# Patient Record
Sex: Female | Born: 1990 | Race: Black or African American | Hispanic: No | Marital: Single | Smoking: Current every day smoker
Health system: Southern US, Community
[De-identification: ages and names within clinical notes are randomized; demographics above are authoritative.]

## PROBLEM LIST (undated history)

## (undated) ENCOUNTER — Inpatient Hospital Stay (HOSPITAL_COMMUNITY): Payer: Self-pay

## (undated) DIAGNOSIS — A749 Chlamydial infection, unspecified: Secondary | ICD-10-CM

## (undated) DIAGNOSIS — Z8619 Personal history of other infectious and parasitic diseases: Secondary | ICD-10-CM

## (undated) DIAGNOSIS — O3430 Maternal care for cervical incompetence, unspecified trimester: Secondary | ICD-10-CM

## (undated) DIAGNOSIS — R51 Headache: Secondary | ICD-10-CM

## (undated) DIAGNOSIS — O26892 Other specified pregnancy related conditions, second trimester: Secondary | ICD-10-CM

## (undated) HISTORY — DX: Personal history of other infectious and parasitic diseases: Z86.19

---
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
## 2012-01-01 LAB — GC/CHLAMYDIA PROBE AMP: CT Probe RNA: POSITIVE — AB

## 2012-01-06 ENCOUNTER — Telehealth: Payer: Self-pay

## 2012-01-06 NOTE — Telephone Encounter (Signed)
Pt was called and given STD results. Pt was advised of + chlamydia as well as reporting to the [facility]. Pt was asked about treating partner, stated that she was not sure who she contacted std from. Pt will be contacted regarding ov for TOC and BC  F/u. RX called to [facility] for Zithromax 1 g po x 1. Sherri Shepherd

## 2012-01-07 ENCOUNTER — Telehealth: Payer: Self-pay

## 2012-01-07 NOTE — Telephone Encounter (Signed)
Called pt to get her scheduled for 2 up-coming appts. Sherri Shepherd A

## 2012-01-12 ENCOUNTER — Ambulatory Visit (INDEPENDENT_AMBULATORY_CARE_PROVIDER_SITE_OTHER): Payer: 59 | Admitting: Obstetrics and Gynecology

## 2012-01-12 ENCOUNTER — Encounter: Payer: Self-pay | Admitting: Obstetrics and Gynecology

## 2012-01-12 VITALS — BP 110/78 | Wt 111.0 lb

## 2012-01-12 DIAGNOSIS — Z309 Encounter for contraceptive management, unspecified: Secondary | ICD-10-CM

## 2012-01-12 DIAGNOSIS — Z09 Encounter for follow-up examination after completed treatment for conditions other than malignant neoplasm: Secondary | ICD-10-CM

## 2012-01-12 NOTE — Progress Notes (Signed)
21 YO with Nexplanon inserted last week for follow up.  Has no complaints.  O: Left medial upper arm with palpable Nexplanon (palpated by clinician and patient),  no evidence of infection  A: Nexplanon Follow up  P: RTO- as scheduled or prn.  Nieshia Larmon, PA-C

## 2012-01-20 ENCOUNTER — Encounter: Payer: Self-pay | Admitting: Obstetrics and Gynecology

## 2012-01-20 ENCOUNTER — Ambulatory Visit (INDEPENDENT_AMBULATORY_CARE_PROVIDER_SITE_OTHER): Payer: 59 | Admitting: Obstetrics and Gynecology

## 2012-01-20 DIAGNOSIS — Z113 Encounter for screening for infections with a predominantly sexual mode of transmission: Secondary | ICD-10-CM

## 2012-01-21 NOTE — Progress Notes (Signed)
Patient ID: Sherri Shepherd, female   DOB: [birth year], 21 y.o.   MRN: [id number] Pt left without being seen this day. She will r/s. Melody Comas A

## 2013-12-26 ENCOUNTER — Encounter: Payer: Self-pay | Admitting: Obstetrics and Gynecology

## 2014-10-02 ENCOUNTER — Emergency Department (HOSPITAL_COMMUNITY)
Admission: EM | Admit: 2014-10-02 | Discharge: 2014-10-02 | Discharge status: Against Medical Advice | Payer: Medicaid Other

## 2014-10-02 NOTE — ED Notes (Signed)
Call pt x 2 no answer

## 2014-11-09 ENCOUNTER — Ambulatory Visit: Payer: Medicaid Other | Admitting: Certified Nurse Midwife

## 2015-02-25 NOTE — L&D Delivery Note (Signed)
  Patient presented to our MAU on 1/17 with PPROM on 1/16. She was seen in the afternoon and elected to discharge home. She returned later that evening with vaginal bleeding, not contracting, and again elected to return home. At approximately 03:00 this morning she began to experience sharp contractions and delivered shortly thereafter. EMS was called and the patient was transported to our hospital. They report no significant bleeding en route. On arrival deceased infant wrapped in towel. Cord clamped and cut. No bleeding. Cytotec 800 mcg vaginal and pitocin 40 units IV started.  Three hours later vaginal exam revealed majority of placenta in uterus. Speculum inserted and placenta grasped with ring forceps and removed, appearing intact. Bleeding minimal. Will give methergine 0.2 mg po q6 for 24 hours.

## 2015-03-12 ENCOUNTER — Encounter (HOSPITAL_COMMUNITY): Payer: Self-pay | Admitting: Emergency Medicine

## 2015-03-12 ENCOUNTER — Inpatient Hospital Stay (EMERGENCY_DEPARTMENT_HOSPITAL)
Admission: EM | Admit: 2015-03-12 | Discharge: 2015-03-13 | Disposition: A | Discharge status: Home / Self Care | Payer: 59 | Source: Home / Self Care | Attending: Emergency Medicine | Admitting: Emergency Medicine

## 2015-03-12 DIAGNOSIS — Z8751 Personal history of pre-term labor: Secondary | ICD-10-CM

## 2015-03-12 DIAGNOSIS — O09899 Supervision of other high risk pregnancies, unspecified trimester: Secondary | ICD-10-CM

## 2015-03-12 DIAGNOSIS — O42012 Preterm premature rupture of membranes, onset of labor within 24 hours of rupture, second trimester: Secondary | ICD-10-CM

## 2015-03-12 DIAGNOSIS — O09219 Supervision of pregnancy with history of pre-term labor, unspecified trimester: Secondary | ICD-10-CM

## 2015-03-12 DIAGNOSIS — O42919 Preterm premature rupture of membranes, unspecified as to length of time between rupture and onset of labor, unspecified trimester: Secondary | ICD-10-CM | POA: Diagnosis present

## 2015-03-12 DIAGNOSIS — O4290 Premature rupture of membranes, unspecified as to length of time between rupture and onset of labor, unspecified weeks of gestation: Secondary | ICD-10-CM

## 2015-03-12 DIAGNOSIS — O09212 Supervision of pregnancy with history of pre-term labor, second trimester: Secondary | ICD-10-CM

## 2015-03-12 DIAGNOSIS — O09892 Supervision of other high risk pregnancies, second trimester: Secondary | ICD-10-CM

## 2015-03-12 DIAGNOSIS — Z3A20 20 weeks gestation of pregnancy: Secondary | ICD-10-CM

## 2015-03-12 NOTE — ED Notes (Signed)
Pt states she is [redacted] weeks pregnant and started leaking vaginal fluid about 1530 this afternoon and states it will not quit  Pt denies any abd pain or cramping  Pt states the fluid is clear

## 2015-03-13 ENCOUNTER — Emergency Department (HOSPITAL_COMMUNITY): Payer: 59

## 2015-03-13 ENCOUNTER — Encounter (HOSPITAL_COMMUNITY): Payer: Self-pay | Admitting: *Deleted

## 2015-03-13 ENCOUNTER — Inpatient Hospital Stay (HOSPITAL_COMMUNITY): Payer: 59

## 2015-03-13 ENCOUNTER — Inpatient Hospital Stay (EMERGENCY_DEPARTMENT_HOSPITAL)
Admission: AD | Admit: 2015-03-13 | Discharge: 2015-03-13 | Disposition: A | Discharge status: Home / Self Care | Payer: 59 | Source: Ambulatory Visit | Attending: Family Medicine | Admitting: Family Medicine

## 2015-03-13 DIAGNOSIS — O42919 Preterm premature rupture of membranes, unspecified as to length of time between rupture and onset of labor, unspecified trimester: Secondary | ICD-10-CM | POA: Diagnosis present

## 2015-03-13 DIAGNOSIS — Z3A20 20 weeks gestation of pregnancy: Secondary | ICD-10-CM

## 2015-03-13 DIAGNOSIS — O09219 Supervision of pregnancy with history of pre-term labor, unspecified trimester: Secondary | ICD-10-CM

## 2015-03-13 DIAGNOSIS — O42912 Preterm premature rupture of membranes, unspecified as to length of time between rupture and onset of labor, second trimester: Secondary | ICD-10-CM

## 2015-03-13 DIAGNOSIS — O09899 Supervision of other high risk pregnancies, unspecified trimester: Secondary | ICD-10-CM

## 2015-03-13 DIAGNOSIS — O4592 Premature separation of placenta, unspecified, second trimester: Secondary | ICD-10-CM

## 2015-03-13 LAB — RAPID URINE DRUG SCREEN, HOSP PERFORMED
Amphetamines: NOT DETECTED
BARBITURATES: NOT DETECTED
BENZODIAZEPINES: NOT DETECTED
Cocaine: NOT DETECTED
Opiates: NOT DETECTED
Tetrahydrocannabinol: NOT DETECTED

## 2015-03-13 LAB — URINALYSIS, ROUTINE W REFLEX MICROSCOPIC
Glucose, UA: NEGATIVE mg/dL
HGB URINE DIPSTICK: NEGATIVE
KETONES UR: 40 mg/dL — AB
NITRITE: NEGATIVE
PROTEIN: NEGATIVE mg/dL
Specific Gravity, Urine: >1.03 — ABNORMAL HIGH (ref 1.005–1.030)
pH: 5.5 (ref 5.0–8.0)

## 2015-03-13 LAB — CBC
HCT: 35.3 % — ABNORMAL LOW (ref 36.0–46.0)
Hemoglobin: 11.8 g/dL — ABNORMAL LOW (ref 12.0–15.0)
MCH: 31.1 pg (ref 26.0–34.0)
MCHC: 33.4 g/dL (ref 30.0–36.0)
MCV: 93.1 fL (ref 78.0–100.0)
Platelets: 168 K/uL (ref 150–400)
RBC: 3.79 MIL/uL — ABNORMAL LOW (ref 3.87–5.11)
RDW: 14.2 % (ref 11.5–15.5)
WBC: 10.2 K/uL (ref 4.0–10.5)

## 2015-03-13 LAB — BASIC METABOLIC PANEL
Anion gap: 10 (ref 5–15)
BUN: <5 mg/dL — ABNORMAL LOW (ref 6–20)
CO2: 21 mmol/L — ABNORMAL LOW (ref 22–32)
Calcium: 8.8 mg/dL — ABNORMAL LOW (ref 8.9–10.3)
Chloride: 107 mmol/L (ref 101–111)
Creatinine, Ser: 0.47 mg/dL (ref 0.44–1.00)
GFR calc Af Amer: >60 mL/min (ref >60)
GFR calc non Af Amer: >60 mL/min (ref >60)
Glucose, Bld: 77 mg/dL (ref 65–99)
Potassium: 3.5 mmol/L (ref 3.5–5.1)
Sodium: 138 mmol/L (ref 135–145)

## 2015-03-13 LAB — URINE MICROSCOPIC-ADD ON
BACTERIA UA: NONE SEEN
RBC / HPF: NONE SEEN RBC/hpf (ref 0–5)

## 2015-03-13 LAB — WET PREP, GENITAL
CLUE CELLS WET PREP: NONE SEEN
Sperm: NONE SEEN
TRICH WET PREP: NONE SEEN
Yeast Wet Prep HPF POC: NONE SEEN

## 2015-03-13 LAB — AMNISURE RUPTURE OF MEMBRANE (ROM) NOT AT ARMC: AMNISURE: NEGATIVE
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
## 2015-03-13 NOTE — ED Provider Notes (Signed)
CSN: [id number]     Arrival date & time 03/12/15  1946 History   First MD Initiated Contact with Patient 03/13/15 0032     Chief Complaint  Patient presents with  . Vaginal Discharge    HPI   25 year old G2 P1 A0 who presents today with amniotic fluid loss. Patient reports that at approximately 3 PM this afternoon she had copious amounts of clear fluid discharging from her vagina. She reports the soaked through her close, and had continued until the time my evaluation. Patient denies any associated abdominal pain, preceding vaginal discharge, bleeding, or any other concerning signs or symptoms. Patient reports that she has had a normal vaginal delivery with her now 24-year-old son, but this was preterm at 79 weeks. Patient denies any drug or alcohol use, does not smoke, has no chronic health conditions, and no complications with previous pregnancy. Patient reports that she [facility], she is uncertain as to what the results of the ultrasound were. Patient reports to nursing staff that she did have sexual intercourse earlier today.    Past Medical History  Diagnosis Date  . No pertinent past medical history   . H/O varicella   . Premature dilatation of cervix during pregnancy 10/01/2011    5/90%/-1  . Preterm delivery 12/04/2011    SVD at [redacted]w[redacted]d on 10/22/11   Past Surgical History  Procedure Laterality Date  . No past surgeries     Family History  Problem Relation Age of Onset  . Hypertension Mother   . Diabetes Brother   . Diabetes Maternal Uncle   . Cancer Maternal Grandmother     LUNG  . Diabetes Maternal Grandfather   . Arthritis Paternal Grandmother   . Cancer Paternal Grandmother     BREAST   Social History  Substance Use Topics  . Smoking status: Never Smoker   . Smokeless tobacco: Never Used  . Alcohol Use: No   OB History    Gravida Para Term Preterm AB TAB SAB Ectopic Multiple Living        Review of Systems  All other systems reviewed and are negative.   Allergies  Review of patient's allergies indicates no known allergies.  Home Medications   Prior to Admission medications   Medication Sig Start Date End Date Taking? Authorizing Provider  Prenatal Vit-Fe Fumarate-FA (PRENATAL MULTIVITAMIN) TABS Take 1 tablet by mouth daily.   Yes Historical Provider, MD   BP 112/78 mmHg  Pulse 89  Temp(Src) 98.2 F (36.8 C) (Oral)  Resp 22  Ht  (1.651 m)  Wt 52.731 kg  BMI 19.35 kg/m2  SpO2 100%  LMP 10/01/2014 (Approximate) Physical Exam  Constitutional: She is oriented to person, place, and time. She appears well-developed and well-nourished.  HENT:  Head: Normocephalic and atraumatic.  Eyes: Conjunctivae are normal. Pupils are equal, round, and reactive to light. Right eye exhibits no discharge. Left eye exhibits no discharge. No scleral icterus.  Neck: Normal range of motion. No JVD present. No tracheal deviation present.  Pulmonary/Chest: Effort normal. No stridor.  Abdominal: Soft. Bowel sounds are normal. She exhibits no distension and no mass. There is no tenderness. There is no rebound and no guarding.  Genitourinary: Vaginal discharge found.  Copious amounts of clear/ yellowish vaginal discharge. Speculum exam performed, unable to visualize cervix due to fluid in the vaginal vault.   Neurological: She is alert  and oriented to person, place, and time. Coordination normal.  Skin: Skin is warm and dry. No rash noted. No erythema. No pallor.  Psychiatric: She has a normal mood and affect. Her behavior is normal. Judgment and thought content normal.  Nursing note and vitals reviewed.   ED Course  Procedures (including critical care time) Labs Review Labs Reviewed  CBC - Abnormal; Notable for the following:    RBC 3.79 (*)    Hemoglobin 11.8 (*)    HCT 35.3 (*)    All other components within normal limits  BASIC METABOLIC PANEL  POCT NITRAZINE TEST     Imaging Review US Ob Limited  03/13/2015  CLINICAL DATA:  Premature rupture of membranes. Twenty-three weeks and 1 day gestational age by last menstrual period. EXAM: LIMITED OBSTETRIC ULTRASOUND FINDINGS: Number of Fetuses: 1 Heart Rate:  141 bpm Movement: Yes Presentation: Cephalic Placental Location: Appearing circumferential. Previa: Difficult to evaluate. Amniotic Fluid (Subjective): Decreased. (0.7 cm in RIGHT lower quadrant) BPD:  2.9cm 18w  6d MATERNAL FINDINGS: Cervix:  Appears closed. Uterus/Adnexae:  No abnormality visualized. IMPRESSION: Oligohydramnios. Single live intrauterine pregnancy, gestational age by ultrasound 18 weeks and 6 days. Limited assessment for placenta via previa due to lack of amniotic fluid. Recommend close attention on follow-up imaging. This exam is performed on an emergent basis and does not comprehensively evaluate fetal size, dating, or anatomy; follow-up complete OB US should be considered if further fetal assessment is warranted. Electronically Signed   By: Awilda Metro M.D.   On: 03/13/2015 03:10   I have personally reviewed and evaluated these images and lab results as part of my medical decision-making.   EKG Interpretation None      MDM   Final diagnoses:  Premature rupture of membranes   Labs: CBC, BMP  Imaging: Ultrasound OB limited  Consults: OB/GYN- Orma Render   Therapeutics:  Discharge Meds:   Assessment/Plan: Patient's presentation is most consistent with premature rupture of membranes. Patient has no associated abdominal pain or bleeding. Consultation with OB/GYN with recommendation of transfer to than MAU . Patient was transferred at this time, she is stable in no acute distress.         Eyvonne Mechanic, PA-C 03/13/15 0401  Laurence Spates, MD 03/13/15 [phone]

## 2015-03-13 NOTE — MAU Note (Signed)
Patient still trying to decide on option to either be admitted or go home. Will call Dr Ashok Pall when decision has been made.

## 2015-03-13 NOTE — MAU Note (Signed)
Pt presents to MAU with complaints of vaginal bleeding that started 45 minutes ago. PT was evaluated in MAU this morning after [facility] Dr Shawnie Pons evaluated pt this morning with options on care. Pt discharged home and was to follow up in the clinic this week.

## 2015-03-13 NOTE — MAU Provider Note (Signed)
History     CSN: [id number]  Arrival date and time: 03/13/15 0931   None     Chief Complaint  Patient presents with  . Vaginal Discharge   HPI Patient is transferred from [facility] ED for presumed ROM.  Patient is 20 0/7 wks today per her report of u/s done at [facility]. She is 23 1/7 wks by LMP. She reports following intercourse that she began leaking fluid. Described as running down her legs in the ED. U/s performed there showed oligohydramnios. She is transferred here for further w/u and eval. Denies s/sx's of labor. ROM now > 24 hours.  Pertinent Gynecological History: Menses: regular every month without intermenstrual spotting Sexually transmitted diseases: recent diagnosis: chlamydia, with both she and partner treated in December. OB History  Gravida Para Term Preterm AB SAB TAB Ectopic Multiple Living     # Outcome Date GA Lbr Len/2nd Weight Sex Delivery Anes PTL Lv  2 Current           1 Preterm 10/22/11 [redacted]w[redacted]d 11:10 / 03:30 5 lb 12.4 oz (2.62 kg) M Vag-Spont EPI  Y     Comments: none       Past Medical History  Diagnosis Date  . No pertinent past medical history   . H/O varicella   . Premature dilatation of cervix during pregnancy 10/01/2011    5/90%/-1  . Preterm delivery 12/04/2011    SVD at [redacted]w[redacted]d on 10/22/11    Past Surgical History  Procedure Laterality Date  . No past surgeries      Family History  Problem Relation Age of Onset  . Hypertension Mother   . Diabetes Brother   . Diabetes Maternal Uncle   . Cancer Maternal Grandmother     LUNG  . Diabetes Maternal Grandfather   . Arthritis Paternal Grandmother   . Cancer Paternal Grandmother     BREAST    Social History  Substance Use Topics  . Smoking status: Never Smoker   . Smokeless tobacco: Never Used  . Alcohol Use: No    Allergies: No Known Allergies  Prescriptions prior to admission  Medication Sig Dispense Refill Last Dose  . Prenatal Vit-Fe Fumarate-FA (PRENATAL  MULTIVITAMIN) TABS Take 1 tablet by mouth daily.   03/12/2015 at Unknown time    Review of Systems  Constitutional: Negative for fever and chills.  HENT: Negative for congestion.   Respiratory: Negative for cough and shortness of breath.   Cardiovascular: Negative for chest pain.  Gastrointestinal: Negative for abdominal pain.  Genitourinary: Positive for dysuria.  Neurological: Negative for seizures and headaches.     Physical Exam   Blood pressure 100/64, pulse 111, temperature 98.1 F (36.7 C), temperature source Oral, resp. rate 16, height  (1.651 m), weight 116 lb 4 oz (52.731 kg), last menstrual period 10/02/2014, SpO2 99 %.  Physical Exam  Constitutional: She appears well-developed and well-nourished.  HENT:  Head: Normocephalic and atraumatic.  Eyes: No scleral icterus.  Neck: Neck supple.  Cardiovascular: Normal rate and regular rhythm.   Respiratory: Effort normal.  GI: Soft. There is no tenderness.  Genitourinary: Vaginal discharge (yellow, purulent, thin) found.  SSE: pooling +, slide reveals [facility] and a lot of bacteria, could not see ferning. Cervix appears closed.   Urinalysis    Component Value Date/Time   COLORURINE AMBER* 03/13/2015 1115   APPEARANCEUR CLEAR 03/13/2015 1115   LABSPEC >1.030* 03/13/2015 1115   PHURINE  5.5 03/13/2015 1115   GLUCOSEU NEGATIVE 03/13/2015 1115   HGBUR NEGATIVE 03/13/2015 1115   BILIRUBINUR SMALL* 03/13/2015 1115   BILIRUBINUR NEG 09/29/2011 1533   KETONESUR 40* 03/13/2015 1115   PROTEINUR NEGATIVE 03/13/2015 1115   PROTEINUR TRACE 09/29/2011 1533   UROBILINOGEN 0.2 10/01/2011 2130   UROBILINOGEN negative 09/29/2011 1533   NITRITE NEGATIVE 03/13/2015 1115   NITRITE NEG 09/29/2011 1533   LEUKOCYTESUR TRACE* 03/13/2015 1115     Microscopic wet-mount exam shows white blood cells. UDS negative U/S shows AFI 1.27 Amnisure neg CBC    Component Value Date/Time   WBC 10.2 03/13/2015 0317   RBC 3.79* 03/13/2015  0317   HGB 11.8* 03/13/2015 0317   HCT 35.3* 03/13/2015 0317   PLT 168 03/13/2015 0317   MCV 93.1 03/13/2015 0317   MCH 31.1 03/13/2015 0317   MCHC 33.4 03/13/2015 0317   RDW 14.2 03/13/2015 0317   LYMPHSABS 2.6 09/29/2011 1513   MONOABS 1.0 09/29/2011 1513   EOSABS 0.1 09/29/2011 1513   BASOSABS 0.1 09/29/2011 1513       MAU Course  Procedures  MDM Previable PPROM in the second trimester is associated with risks such as chorioamnionitis, placental abruption, umbilical cord prolapse, maternal distress due to overwhelming infection; and these may constitute indications for delivery prior to viability.. Should she make it to viability, risks associated with pre-viable PPROM include classical C-section and fetal sepsis and distress. Chances of pulmonary hypoplasia and risks of pre-maturity including major handicaps and Cerebral Palsy were also discussed.  Assessment and Plan   Problem List Items Addressed This Visit      Unprioritized   Premature rupture of membranes   Relevant Orders   US OB Limited (Completed)   [place] MFM OB LIMITED   History of preterm delivery   Relevant Orders   [place] MFM OB LIMITED    Other Visit Diagnoses    [redacted] weeks gestation of pregnancy    -  Primary    Relevant Orders    [place] MFM OB LIMITED    History of preterm delivery, currently pregnant, second trimester        Preterm premature rupture of membranes with onset of labor within 24 hours of rupture in second trimester        Relevant Orders    [place] MFM OB LIMITED      She is not in labor and will be sent home on pelvic rest, daily temps and plan for admission when she reaches viability or becomes infected.  Will transfer patient to [facility] for remainder of care GC/Chlam pending--retreat if needed.  Aspen Deterding S 03/13/2015, 11:26 AM

## 2015-03-13 NOTE — ED Notes (Addendum)
[facility] HERE FOR THIS PT. TRANSFER TO MAU.
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
## 2015-03-13 NOTE — MAU Note (Signed)
Pt presents to MAU via EMS from [facility] ED for possible ROM. Denies any vaginal bleeding or pain.

## 2015-03-13 NOTE — ED Notes (Signed)
PT HAS CELL PHONE AT TIME OF TRANSFER

## 2015-03-13 NOTE — Discharge Instructions (Signed)
Breaking your water in the second trimester is associated with risks such as infectio and bleeding and these may constitute indications for delivery prior to an age that the baby can survive. Should you make it to an age where the baby can survive (about 24 weeks), risks associated with prematurity include needing a classical C-section (which is vertical and will necessitate a C-section for all deliveries) and fetal infection, major handicaps, possible Cerebral Palsy and even death. There is no guarantee that if the baby gets to an age it can survive that it will.  You are not in labor and will be sent home. Please check daily temperatures (above 101 should bring you to the hospital) and plan for admission when the baby can survive or you develop infection, or go into labor or have heavy bleeding.     Preterm Labor Information Preterm labor is when labor starts at less than 37 weeks of pregnancy. The normal length of a pregnancy is 39 to 41 weeks. CAUSES Often, there is no identifiable underlying cause as to why a woman goes into preterm labor. One of the most common known causes of preterm labor is infection. Infections of the uterus, cervix, vagina, amniotic sac, bladder, kidney, or even the lungs (pneumonia) can cause labor to start. Other suspected causes of preterm labor include:   Urogenital infections, such as yeast infections and bacterial vaginosis.   Uterine abnormalities (uterine shape, uterine septum, fibroids, or bleeding from the placenta).   A cervix that has been operated on (it may fail to stay closed).   Malformations in the fetus.   Multiple gestations (twins, triplets, and so on).   Breakage of the amniotic sac.  RISK FACTORS  Having a previous history of preterm labor.   Having premature rupture of membranes (PROM).   Having a placenta that covers the opening of the cervix (placenta previa).   Having a placenta that separates from the uterus (placental  abruption).   Having a cervix that is too weak to hold the fetus in the uterus (incompetent cervix).   Having too much fluid in the amniotic sac (polyhydramnios).   Taking illegal drugs or smoking while pregnant.   Not gaining enough weight while pregnant.   Being younger than 17 and older than 23 years old.   Having a low socioeconomic status.   Being African American. SYMPTOMS Signs and symptoms of preterm labor include:   Menstrual-like cramps, abdominal pain, or back pain.  Uterine contractions that are regular, as frequent as six in an hour, regardless of their intensity (may be mild or painful).  Contractions that start on the top of the uterus and spread down to the lower abdomen and back.   A sense of increased pelvic pressure.   A watery or bloody mucus discharge that comes from the vagina.  TREATMENT Depending on the length of the pregnancy and other circumstances, your health care provider may suggest bed rest. If necessary, there are medicines that can be given to stop contractions and to mature the fetal lungs. If labor happens before 34 weeks of pregnancy, a prolonged hospital stay may be recommended. Treatment depends on the condition of both you and the fetus.  WHAT SHOULD YOU DO IF YOU THINK YOU ARE IN PRETERM LABOR? Call your health care provider right away. You will need to go to the hospital to get checked immediately. HOW CAN YOU PREVENT PRETERM LABOR IN FUTURE PREGNANCIES? You should:   Stop smoking if you smoke.  Maintain  healthy weight gain and avoid chemicals and drugs that are not necessary.  Be watchful for any type of infection.  Inform your health care provider if you have a known history of preterm labor.   This information is not intended to replace advice given to you by your health care provider. Make sure you discuss any questions you have with your health care provider.   Document Released: 05/03/2003 Document Revised:  10/13/2012 Document Reviewed: 03/15/2012 Elsevier Interactive Patient Education [employer]

## 2015-03-13 NOTE — MAU Note (Signed)
Patient has decided that she would like to go home. Dr Ashok Pall called and at bedside to discuss with patient.

## 2015-03-13 NOTE — ED Notes (Signed)
MAU CALLED THIS AM. AWARE OF [facility] TRANSFER

## 2015-03-14 ENCOUNTER — Encounter (HOSPITAL_COMMUNITY): Payer: Self-pay | Admitting: *Deleted

## 2015-03-14 ENCOUNTER — Inpatient Hospital Stay (HOSPITAL_COMMUNITY)
Admission: AD | Admit: 2015-03-14 | Discharge: 2015-03-14 | DRG: 776 | Disposition: A | Discharge status: Home / Self Care | Payer: 59 | Source: Ambulatory Visit | Attending: Family Medicine | Admitting: Family Medicine

## 2015-03-14 DIAGNOSIS — O09899 Supervision of other high risk pregnancies, unspecified trimester: Secondary | ICD-10-CM

## 2015-03-14 DIAGNOSIS — O42919 Preterm premature rupture of membranes, unspecified as to length of time between rupture and onset of labor, unspecified trimester: Secondary | ICD-10-CM | POA: Diagnosis present

## 2015-03-14 DIAGNOSIS — O09219 Supervision of pregnancy with history of pre-term labor, unspecified trimester: Secondary | ICD-10-CM

## 2015-03-14 DIAGNOSIS — O3432 Maternal care for cervical incompetence, second trimester: Secondary | ICD-10-CM

## 2015-03-14 DIAGNOSIS — Z8619 Personal history of other infectious and parasitic diseases: Secondary | ICD-10-CM | POA: Diagnosis present

## 2015-03-14 LAB — TYPE AND SCREEN
ABO/RH(D): O POS
Antibody Screen: NEGATIVE

## 2015-03-14 LAB — CBC
HEMATOCRIT: 29.9 % — AB (ref 36.0–46.0)
HEMOGLOBIN: 10.2 g/dL — AB (ref 12.0–15.0)
MCH: 30.7 pg (ref 26.0–34.0)
MCHC: 34.1 g/dL (ref 30.0–36.0)
MCV: 90.1 fL (ref 78.0–100.0)
PLATELETS: 146 10*3/uL — AB (ref 150–400)
RBC: 3.32 MIL/uL — AB (ref 3.87–5.11)
RDW: 14 % (ref 11.5–15.5)
WBC: 22.3 10*3/uL — AB (ref 4.0–10.5)

## 2015-03-14 LAB — ABO/RH: ABO/RH(D): O POS

## 2015-03-14 LAB — GC/CHLAMYDIA PROBE AMP (~~LOC~~) NOT AT ARMC
CHLAMYDIA, DNA PROBE: NEGATIVE
NEISSERIA GONORRHEA: NEGATIVE

## 2015-03-14 MED ORDER — LACTATED RINGERS IV SOLN: 500.0000 mL | INTRAVENOUS | Status: DC | PRN

## 2015-03-14 MED ORDER — FLEET ENEMA 7-19 GM/118ML RE ENEM: 1.0000 | ENEMA | RECTAL | Status: DC | PRN

## 2015-03-14 MED ORDER — ONDANSETRON HCL 4 MG/2ML IJ SOLN: 4.0000 mg | Freq: Four times a day (QID) | INTRAMUSCULAR | Status: DC | PRN

## 2015-03-14 MED ORDER — LACTATED RINGERS IV SOLN
INTRAVENOUS | Status: DC
Start: 2015-03-14 — End: 2015-03-14

## 2015-03-14 MED ORDER — OXYTOCIN BOLUS FROM INFUSION: 500.0000 mL | INTRAVENOUS | Status: DC

## 2015-03-14 MED ORDER — METHYLERGONOVINE MALEATE 0.2 MG PO TABS
0.2000 mg | ORAL_TABLET | Freq: Four times a day (QID) | ORAL | Status: DC
Start: 2015-03-14 — End: 2015-03-14
  Administered 2015-03-14: 0.2 mg via ORAL
  Filled 2015-03-14: qty 1

## 2015-03-14 MED ORDER — OXYCODONE-ACETAMINOPHEN 5-325 MG PO TABS
ORAL_TABLET | ORAL | Status: AC
  Filled 2015-03-14: qty 1

## 2015-03-14 MED ORDER — MISOPROSTOL 200 MCG PO TABS
800.0000 ug | ORAL_TABLET | Freq: Once | ORAL | Status: AC
  Administered 2015-03-14: 800 ug via VAGINAL
  Filled 2015-03-14: qty 4

## 2015-03-14 MED ORDER — OXYCODONE-ACETAMINOPHEN 5-325 MG PO TABS
1.0000 | ORAL_TABLET | Freq: Once | ORAL | Status: AC
  Administered 2015-03-14: 1 via ORAL

## 2015-03-14 MED ORDER — OXYTOCIN 10 UNIT/ML IJ SOLN
2.5000 [IU]/h | INTRAMUSCULAR | Status: DC
  Administered 2015-03-14: 04:00:00 via INTRAVENOUS

## 2015-03-14 MED ORDER — CITRIC ACID-SODIUM CITRATE 334-500 MG/5ML PO SOLN
30.0000 mL | ORAL | Status: DC | PRN
  Filled 2015-03-14: qty 30

## 2015-03-14 MED ORDER — ACETAMINOPHEN 325 MG PO TABS: 650.0000 mg | ORAL_TABLET | ORAL | Status: DC | PRN

## 2015-03-14 MED ORDER — OXYTOCIN 10 UNIT/ML IJ SOLN
INTRAVENOUS | Status: AC
  Filled 2015-03-14: qty 4

## 2015-03-14 MED ORDER — INFLUENZA VAC SPLIT QUAD 0.5 ML IM SUSY
0.5000 mL | PREFILLED_SYRINGE | Freq: Once | INTRAMUSCULAR | Status: AC
  Administered 2015-03-14: 0.5 mL via INTRAMUSCULAR
  Filled 2015-03-14: qty 0.5

## 2015-03-14 MED ORDER — OXYCODONE-ACETAMINOPHEN 5-325 MG PO TABS: 1.0000 | ORAL_TABLET | ORAL | Status: DC | PRN

## 2015-03-14 MED ORDER — LIDOCAINE HCL (PF) 1 % IJ SOLN
30.0000 mL | INTRAMUSCULAR | Status: DC | PRN
  Filled 2015-03-14: qty 30

## 2015-03-14 MED ORDER — IBUPROFEN 600 MG PO TABS: 600.0000 mg | ORAL_TABLET | Freq: Four times a day (QID) | ORAL | Status: DC | PRN

## 2015-03-14 MED ORDER — OXYCODONE-ACETAMINOPHEN 5-325 MG PO TABS: 2.0000 | ORAL_TABLET | ORAL | Status: DC | PRN
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
## 2015-03-14 NOTE — Progress Notes (Signed)
Discharge instructions and reasons to call physician reviewed with patient.  All questions answered.  IV removed prior to discharge.  All belongings at bedside sent home with patient, no equipment sent home with patient.  Stable condition upon discharge.  All resources from chaplain and SW sent home with patient.  Escorted to the main entrance by NT.

## 2015-03-14 NOTE — H&P (Signed)
LABOR ADMISSION HISTORY AND PHYSICAL  VERNADINE COOMBS is a 25 y.o. female G35P0101 with IUP at [redacted]w[redacted]d by [place] presenting after delivering at home. Per chart review, she was initially seen on the morning of 03/12/14 in the MAU for PPROM that occurred 1/16 . Ultrasound at that point revealed severe oligohydramnios. Patient was counseled and opted for discharge home. She was seen again in the MAU on the evening of 1/17 for vaginal bleeding after PPROM which was thought to be likely due to abruption. Patient was counseled and patient opted to return home after observation without further vaginal bleeding.   Patient reports she started having cramping around 1 or 2 AM and delivered baby shortly after close to 3 AM. Patient did not deliver placenta. She was [facility] by EMS. En route, patient was given IVF bolus. Patient reports some cramping but has not other complaints. No vaginal bleeding currently. Denies lightheadedness, shortness of breath, fevers, chills.   Dating: By LMP --->  Estimated Date of Delivery: 07/31/15  Sono:  Unable to find in chart  Prenatal History/Complications: Hx of chlamydia x 2 during pregnancy; last diagnosed 12/12; treated 12/15 (by [facility])  Past Medical History: Past Medical History  Diagnosis Date  . No pertinent past medical history   . H/O varicella   . Premature dilatation of cervix during pregnancy 10/01/2011    5/90%/-1  . Preterm delivery 12/04/2011    SVD at [redacted]w[redacted]d on 10/22/11    Past Surgical History: Past Surgical History  Procedure Laterality Date  . No past surgeries      Obstetrical History: OB History    Gravida Para Term Preterm AB TAB SAB Ectopic Multiple Living        Social History: Social History   Social History  . Marital Status: Single    Spouse Name: N/A  . Number of Children: N/A  . Years of Education: 12   Occupational History  . HOMEMAKER    Social History Main Topics  . Smoking status: Never  Smoker   . Smokeless tobacco: Never Used  . Alcohol Use: No  . Drug Use: No  . Sexual Activity:    Partners: Male    Birth Control/ Protection: Implant     Comment: nexplanon   Other Topics Concern  . Not on file   Social History Narrative    Family History: Family History  Problem Relation Age of Onset  . Hypertension Mother   . Diabetes Brother   . Diabetes Maternal Uncle   . Cancer Maternal Grandmother     LUNG  . Diabetes Maternal Grandfather   . Arthritis Paternal Grandmother   . Cancer Paternal Grandmother     BREAST    Allergies: No Known Allergies  Prescriptions prior to admission  Medication Sig Dispense Refill Last Dose  . Prenatal Vit-Fe Fumarate-FA (PRENATAL MULTIVITAMIN) TABS Take 1 tablet by mouth daily.   03/12/2015 at Unknown time     Review of Systems   All systems reviewed and negative except as stated in HPI  LMP 10/24/2014 General appearance: alert and cooperative Lungs: clear to auscultation bilaterally Heart: regular rate and rhythm Abdomen: soft, non-tender; bowel sounds normal Extremities: Homans sign is negative, no sign of DVT, edema    Prenatal labs: ABO, Rh:   O pos Antibody:  negative Rubella: Immune RPR:   nonreactive HBsAg:   negative HIV:   Nonreactive GBS:   (unknown)  1  hr Glucola: n/a Genetic screening: CF screen negative Anatomy US: not in chart   Prenatal Transfer Tool  Maternal Diabetes: N/A Genetic Screening: Normal Maternal Ultrasounds/Referrals: not in chart Fetal Ultrasounds or other Referrals:  None Maternal Substance Abuse:  No Significant Maternal Medications:  None Significant Maternal Lab Results: None  Results for orders placed or performed during the hospital encounter of 03/12/15 (from the past 24 hour(s))  Wet prep, genital   Collection Time: 03/13/15 11:00 AM  Result Value Ref Range   Yeast Wet Prep HPF POC NONE SEEN NONE SEEN   Trich, Wet Prep NONE SEEN NONE SEEN   Clue Cells Wet Prep HPF  POC NONE SEEN NONE SEEN   WBC, Wet Prep HPF POC MANY (A) NONE SEEN   Sperm NONE SEEN   Amnisure rupture of membrane (rom)not at [facility]   Collection Time: 03/13/15 11:15 AM  Result Value Ref Range   Amnisure ROM NEGATIVE   Urinalysis, Routine w reflex microscopic (not at [facility])   Collection Time: 03/13/15 11:15 AM  Result Value Ref Range   Color, Urine AMBER (A) YELLOW   APPearance CLEAR CLEAR   Specific Gravity, Urine >1.030 (H) 1.005 - 1.030   pH 5.5 5.0 - 8.0   Glucose, UA NEGATIVE NEGATIVE mg/dL   Hgb urine dipstick NEGATIVE NEGATIVE   Bilirubin Urine SMALL (A) NEGATIVE   Ketones, ur 40 (A) NEGATIVE mg/dL   Protein, ur NEGATIVE NEGATIVE mg/dL   Nitrite NEGATIVE NEGATIVE   Leukocytes, UA TRACE (A) NEGATIVE  Urine rapid drug screen (hosp performed)   Collection Time: 03/13/15 11:15 AM  Result Value Ref Range   Opiates NONE DETECTED NONE DETECTED   Cocaine NONE DETECTED NONE DETECTED   Benzodiazepines NONE DETECTED NONE DETECTED   Amphetamines NONE DETECTED NONE DETECTED   Tetrahydrocannabinol NONE DETECTED NONE DETECTED   Barbiturates NONE DETECTED NONE DETECTED  Urine microscopic-add on   Collection Time: 03/13/15 11:15 AM  Result Value Ref Range   Squamous Epithelial / LPF 0-5 (A) NONE SEEN   WBC, UA 6-30 0 - 5 WBC/hpf   RBC / HPF NONE SEEN 0 - 5 RBC/hpf   Bacteria, UA NONE SEEN NONE SEEN    Patient Active Problem List   Diagnosis Date Noted  . Preterm premature rupture of membranes (PPROM) delivered, current hospitalization 03/13/2015  . History of preterm delivery 03/13/2015  . Nexplanon insertion - 12/30/11 12/30/2011  . Preterm delivery 12/04/2011  . Preterm labor 10/22/2011  . Chlamydia 10/02/2011  . Teen pregnancy 10/01/2011  . Trichomonas 10/01/2011  . Premature dilatation of cervix during pregnancy 10/01/2011  . Late prenatal care 09/27/2011    Assessment: MADISSEN WYSE is a 26 y.o. G2P0101 at [redacted]w[redacted]d here after home delivery. Placenta yet to be  delivered. Patient is clinically stable.  - Pitocin and cytotec to facilitate placenta delivery - declines autopsy - SW consult  Palma Holter, MD PGY 1 Family Medicine   OB FELLOW HISTORY AND PHYSICAL ATTESTATION  I have seen and examined this patient; I agree with above documentation in the resident's note.    Cherrie Gauze Ryelle Ruvalcaba 03/14/2015, 4:20 AM
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
## 2015-03-14 NOTE — Progress Notes (Signed)
Pt given d/c instructions & d/c to home.

## 2015-03-14 NOTE — Progress Notes (Signed)
UR chart review completed.  

## 2015-03-15 LAB — RPR: RPR: NONREACTIVE

## 2015-03-15 LAB — CULTURE, BETA STREP (GROUP B ONLY)

## 2015-03-28 DIAGNOSIS — A749 Chlamydial infection, unspecified: Secondary | ICD-10-CM

## 2015-03-28 HISTORY — DX: Chlamydial infection, unspecified: A74.9

## 2015-04-06 ENCOUNTER — Encounter (HOSPITAL_COMMUNITY): Payer: Self-pay | Admitting: Emergency Medicine

## 2015-04-06 ENCOUNTER — Emergency Department (HOSPITAL_COMMUNITY)
Admission: EM | Admit: 2015-04-06 | Discharge: 2015-04-06 | Disposition: A | Discharge status: Home / Self Care | Payer: Medicaid Other | Attending: Emergency Medicine | Admitting: Emergency Medicine

## 2015-04-06 DIAGNOSIS — Z79899 Other long term (current) drug therapy: Secondary | ICD-10-CM | POA: Diagnosis not present

## 2015-04-06 DIAGNOSIS — N72 Inflammatory disease of cervix uteri: Secondary | ICD-10-CM

## 2015-04-06 DIAGNOSIS — Z3490 Encounter for supervision of normal pregnancy, unspecified, unspecified trimester: Secondary | ICD-10-CM

## 2015-04-06 DIAGNOSIS — Z8751 Personal history of pre-term labor: Secondary | ICD-10-CM | POA: Insufficient documentation

## 2015-04-06 DIAGNOSIS — N898 Other specified noninflammatory disorders of vagina: Secondary | ICD-10-CM | POA: Diagnosis present

## 2015-04-06 DIAGNOSIS — Z8619 Personal history of other infectious and parasitic diseases: Secondary | ICD-10-CM | POA: Insufficient documentation

## 2015-04-06 DIAGNOSIS — Z331 Pregnant state, incidental: Secondary | ICD-10-CM | POA: Diagnosis not present

## 2015-04-06 LAB — URINE MICROSCOPIC-ADD ON

## 2015-04-06 LAB — URINALYSIS, ROUTINE W REFLEX MICROSCOPIC
BILIRUBIN URINE: NEGATIVE
Glucose, UA: NEGATIVE mg/dL
KETONES UR: NEGATIVE mg/dL
NITRITE: NEGATIVE
PROTEIN: NEGATIVE mg/dL
Specific Gravity, Urine: 1.022 (ref 1.005–1.030)
pH: 5 (ref 5.0–8.0)

## 2015-04-06 LAB — WET PREP, GENITAL
CLUE CELLS WET PREP: NONE SEEN
SPERM: NONE SEEN
Trich, Wet Prep: NONE SEEN
Yeast Wet Prep HPF POC: NONE SEEN

## 2015-04-06 LAB — POC URINE PREG, ED: Preg Test, Ur: POSITIVE — AB

## 2015-04-06 MED ORDER — CEFTRIAXONE SODIUM 250 MG IJ SOLR
250.0000 mg | Freq: Once | INTRAMUSCULAR | Status: AC
  Administered 2015-04-06: 250 mg via INTRAMUSCULAR
  Filled 2015-04-06: qty 250

## 2015-04-06 MED ORDER — STERILE WATER FOR INJECTION IJ SOLN
INTRAMUSCULAR | Status: AC
  Administered 2015-04-06: 1 mL
  Filled 2015-04-06: qty 10

## 2015-04-06 MED ORDER — AZITHROMYCIN 250 MG PO TABS
1000.0000 mg | ORAL_TABLET | Freq: Once | ORAL | Status: AC
  Administered 2015-04-06: 1000 mg via ORAL
  Filled 2015-04-06: qty 4
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
## 2015-04-06 NOTE — ED Notes (Signed)
Pt reports that she thinks she may be pregnant and is also having mild spotting which she is concerned about. Pt alert x4. NAD at this time.

## 2015-04-06 NOTE — ED Provider Notes (Signed)
CSN: [id number]     Arrival date & time 04/06/15  1108 History   First MD Initiated Contact with Patient 04/06/15 1230     Chief Complaint  Patient presents with  . Possible Pregnancy  . Vaginal Discharge   (Consider location/radiation/quality/duration/timing/severity/associated sxs/prior Treatment) HPI 25 y.o. female presents to the Emergency Department today due to vaginal spotting and possible pregnancy. Has had previous PPROM 03-14-15 which resulted in a miscarriage. Currently having vaginal spotting x 2 days. No N/V/D. No fevers. No CP/ABD/SOB. No dysuria. No vaginal pain/odor/disharge. Does not have OB/GYN. Normally just goes through MAU. No other symptoms noted.    Past Medical History  Diagnosis Date  . No pertinent past medical history   . H/O varicella   . Premature dilatation of cervix during pregnancy 10/01/2011    5/90%/-1  . Preterm delivery 12/04/2011    SVD at [redacted]w[redacted]d on 10/22/11   Past Surgical History  Procedure Laterality Date  . No past surgeries     Family History  Problem Relation Age of Onset  . Hypertension Mother   . Diabetes Brother   . Diabetes Maternal Uncle   . Cancer Maternal Grandmother     LUNG  . Diabetes Maternal Grandfather   . Arthritis Paternal Grandmother   . Cancer Paternal Grandmother     BREAST   Social History  Substance Use Topics  . Smoking status: Never Smoker   . Smokeless tobacco: Never Used  . Alcohol Use: No   OB History    Gravida Para Term Preterm AB TAB SAB Ectopic Multiple Living       Review of Systems ROS reviewed and all are negative for acute change except as noted in the HPI.  Allergies  Review of patient's allergies indicates no known allergies.  Home Medications   Prior to Admission medications   Medication Sig Start Date End Date Taking? Authorizing Provider  ibuprofen (ADVIL,MOTRIN) 600 MG tablet Take 1 tablet (600 mg total) by mouth every 6 (six) hours as needed. 03/14/15   Federico Flake, MD  Prenatal Vit-Fe Fumarate-FA (PRENATAL MULTIVITAMIN) TABS Take 1 tablet by mouth daily.    Historical Provider, MD   BP 122/73 mmHg  Pulse 85  Temp(Src) 98.2 F (36.8 C) (Oral)  Resp 18  SpO2 100%  LMP 10/24/2014 Physical Exam  Constitutional: She is oriented to person, place, and time. She appears well-developed and well-nourished.  HENT:  Head: Normocephalic and atraumatic.  Eyes: EOM are normal.  Neck: Normal range of motion. Neck supple.  Cardiovascular: Normal rate, regular rhythm and normal heart sounds.   Pulmonary/Chest: Effort normal and breath sounds normal.  Abdominal: Soft. There is no tenderness.  Musculoskeletal: Normal range of motion.  Neurological: She is alert and oriented to person, place, and time.  Skin: Skin is warm and dry.  Psychiatric: She has a normal mood and affect. Her behavior is normal. Thought content normal.  Nursing note and vitals reviewed.  Exam performed by Eston Esters,  exam chaperoned Date: 04/06/2015 Pelvic exam: normal external genitalia without evidence of trauma. VULVA: normal appearing vulva with no masses, tenderness or lesion. VAGINA: normal appearing vagina with normal color and discharge, no lesions. CERVIX: normal appearing cervix without lesions, cervical motion tenderness absent, cervical os closed with out purulent discharge; vaginal discharge - minimal blood, Wet prep and DNA probe for chlamydia and GC obtained.   ADNEXA: normal adnexa in size, nontender and  no masses UTERUS: uterus is normal size, shape, consistency and nontender.   ED Course  Procedures (including critical care time) Labs Review Labs Reviewed  WET PREP, GENITAL - Abnormal; Notable for the following:    WBC, Wet Prep HPF POC MANY (*)    All other components within normal limits  URINALYSIS, ROUTINE W REFLEX MICROSCOPIC (NOT AT [facility]) - Abnormal; Notable for the following:    Hgb urine dipstick SMALL (*)    Leukocytes, UA SMALL (*)     All other components within normal limits  URINE MICROSCOPIC-ADD ON - Abnormal; Notable for the following:    Squamous Epithelial / LPF 0-5 (*)    Bacteria, UA FEW (*)    All other components within normal limits  POC URINE PREG, ED - Abnormal; Notable for the following:    Preg Test, Ur POSITIVE (*)    All other components within normal limits  URINE CULTURE  GC/CHLAMYDIA PROBE AMP ([place]) NOT AT [facility]   Imaging Review No results found. I have personally reviewed and evaluated these images and lab results as part of my medical decision-making.   EKG Interpretation None      MDM  I have reviewed relevant laboratory values. I have reviewed the relevant previous healthcare records.  I obtained HPI from historian. Patient discussed with supervising physician  ED Course:  Assessment: 48y F with pmh PPROM 03-14-15 which resulted in miscarriage presents with vaginal spotting as well as poss pregnany. Tested positive on urine preg. On exam, vaginal vault showed scant bleeding. No CMT/ Adnexal tenderness. Cervix closed. UA/GC/Wet Prep obtained. Neg for UTI. Wet Prep showed WBCs. Treated with Azithro/Rocephin. Patient is in no acute distress. Vital Signs are stable. Patient is able to ambulate. Patient able to tolerate PO. Will DC with follow up to OB/GYN for management of pregnancy.      Disposition/Plan:  DC Home Additional Verbal discharge instructions given and discussed with patient.  Pt Instructed to f/u with OB/GYN Return precautions given Pt acknowledges and agrees with plan  Supervising Physician Lavera Guise, MD   Final diagnoses:  Pregnancy  Cervicitis     Audry Pili, PA-C 04/06/15 1356  Lavera Guise, MD 04/06/15 1726

## 2015-04-07 LAB — URINE CULTURE

## 2015-04-09 LAB — GC/CHLAMYDIA PROBE AMP (~~LOC~~) NOT AT ARMC
CHLAMYDIA, DNA PROBE: POSITIVE — AB
Neisseria Gonorrhea: NEGATIVE

## 2015-04-10 ENCOUNTER — Telehealth (HOSPITAL_COMMUNITY): Payer: Self-pay
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
## 2015-04-17 ENCOUNTER — Other Ambulatory Visit: Payer: Medicaid Other

## 2015-04-17 DIAGNOSIS — O3680X0 Pregnancy with inconclusive fetal viability, not applicable or unspecified: Secondary | ICD-10-CM

## 2015-04-18 LAB — HCG, QUANTITATIVE, PREGNANCY

## 2015-04-19 ENCOUNTER — Encounter (HOSPITAL_COMMUNITY): Payer: Self-pay | Admitting: *Deleted

## 2015-04-19 ENCOUNTER — Inpatient Hospital Stay (HOSPITAL_COMMUNITY)
Admission: AD | Admit: 2015-04-19 | Discharge: 2015-04-19 | Disposition: A | Discharge status: Home / Self Care | Payer: Medicaid Other | Source: Ambulatory Visit | Attending: Obstetrics & Gynecology | Admitting: Obstetrics & Gynecology

## 2015-04-19 DIAGNOSIS — N910 Primary amenorrhea: Secondary | ICD-10-CM | POA: Diagnosis not present

## 2015-04-19 DIAGNOSIS — Z711 Person with feared health complaint in whom no diagnosis is made: Secondary | ICD-10-CM | POA: Diagnosis not present

## 2015-04-19 DIAGNOSIS — N939 Abnormal uterine and vaginal bleeding, unspecified: Secondary | ICD-10-CM | POA: Diagnosis present

## 2015-04-19 HISTORY — DX: Chlamydial infection, unspecified: A74.9

## 2015-04-19 NOTE — MAU Note (Signed)
started spotting yesterday, more this morninig. No pian.

## 2015-04-19 NOTE — Discharge Instructions (Signed)
°Menstruation °Menstruation is the monthly passing of blood, tissue, fluid, and mucus. It is also known as a period. Your body is shedding the lining of the uterus. The flow of blood usually occurs during 3-7 consecutive days each month. Hormones control the menstrual cycle. Hormones are a chemical substance produced by endocrine glands in the body to regulate different bodily functions. °The first menstrual period may start any time between age 23 years to 16 years. However, it usually starts around age 12 years. Some girls have regular monthly menstrual cycles right from the beginning. However, it is not unusual to have only a couple of drops of blood or spotting when you first start menstruating. It is also not unusual to have two periods a month or miss a month or two when first starting your periods. °SYMPTOMS  °· Mild to moderate abdominal cramps. °· Aching or pain in the lower back area. °Symptoms may occur 5-10 days before your menstrual period starts. These symptoms are referred to as premenstrual syndrome (PMS). These symptoms can include: °· Headache. °· Breast tenderness and swelling. °· Bloating. °· Tiredness (fatigue). °· Mood changes. °· Craving for certain foods. °These are normal signs and symptoms and can vary in severity. To help relieve these problems, ask your caregiver if you can take over-the-counter medications for pain or discomfort. If the symptoms are not controllable, see your caregiver for help.  °HORMONES INVOLVED IN MENSTRUATION °Menstruation comes about because of hormones produced by the pituitary gland in the brain and the ovaries that affect the uterine lining. °First, the pituitary gland in the brain produces the hormone follicle stimulating hormone (FSH). FSH stimulates the ovaries to produce estrogen, which thickens the uterine lining and begins to develop an egg in the ovary. About 14 days later, the pituitary gland produces another hormone called luteinizing hormone (LH). LH  causes the egg to come out of a sac in the ovary (ovulation). The empty sac on the ovary called the corpus luteum is stimulated by another hormone from the pituitary gland called luteotropin. The corpus luteum begins to produce the estrogen and progesterone hormone. The progesterone hormone prepares the lining of the uterus to have the fertilized egg (egg combined with sperm) attach to the lining of the uterus and begin to develop into a fetus. If the egg is not fertilized, the corpus luteum stops producing estrogen and progesterone, it disappears, the lining of the uterus sloughs off and a menstrual period begins. Then the menstrual cycle starts all over again and will continue monthly unless pregnancy occurs or menopause begins. °The secretion of hormones is complex. Various parts of the body become involved in many chemical activities. Female sex hormones have other functions in a woman's body as well. Estrogen increases a woman's sex drive (libido). It naturally helps body get rid of fluids (diuretic). It also aids in the process of building new bone. Therefore, maintaining hormonal health is essential to all levels of a woman's well being. These hormones are usually present in normal amounts and cause you to menstruate. It is the relationship between the (small) levels of the hormones that is critical. When the balance is upset, menstrual irregularities can occur. °HOW DOES THE MENSTRUAL CYCLE HAPPEN? °· Menstrual cycles vary in length from 21-35 days with an average of 29 days. The cycle begins on the first day of bleeding. At this time, the pituitary gland in the brain releases FSH that travels through the bloodstream to the ovaries. The FSH stimulates the follicles in   the ovaries. This prepares the body for ovulation that occurs around the 14th day of the cycle. The ovaries produce estrogen, and this makes sure conditions are right in the uterus for implantation of the fertilized egg. °· When the levels of  estrogen reach a high enough level, it signals the gland in the brain (pituitary gland) to release a surge of LH. This causes the release of the ripest egg from its follicle (ovulation). Usually only one follicle releases one egg, but sometimes more than one follicle releases an egg especially when stimulating the ovaries for in vitro fertilization. The egg can then be collected by either fallopian tube to await fertilization. The burst follicle within the ovary that is left behind is now called the corpus luteum or "yellow body." The corpus luteum continues to give off (secrete) reduced amounts of estrogen. This closes and hardens the cervix. It dries up the mucus to the naturally infertile condition. °· The corpus luteum also begins to give off greater amounts of progesterone. This causes the lining of the uterus (endometrium) to thicken even more in preparation for the fertilized egg. The egg is starting to journey down from the fallopian tube to the uterus. It also signals the ovaries to stop releasing eggs. It assists in returning the cervical mucus to its infertile state. °· If the egg implants successfully into the womb lining and pregnancy occurs, progesterone levels will continue to raise. It is often this hormone that gives some pregnant women a feeling of well being, like a "natural high." Progesterone levels drop again after childbirth. °· If fertilization does not occur, the corpus luteum dies, stopping the production of hormones. This sudden drop in progesterone causes the uterine lining to break down, accompanied by blood (menstruation). °· This starts the cycle back at day 1. The whole process starts all over again. Woman go through this cycle every month from puberty to menopause. Women have breaks only for pregnancy and breastfeeding (lactation), unless the woman has health problems that affect the female hormone system or chooses to use oral contraceptives to have unnatural menstrual  periods. °HOME CARE INSTRUCTIONS  °· Keep track of your periods by using a calendar. °· If you use tampons, get the least absorbent to avoid toxic shock syndrome. °· Do not leave tampons in the vagina over night or longer than 6 hours. °· Wear a sanitary pad over night. °· Exercise 3-5 times a week or more. °· Avoid foods and drinks that you know will make your symptoms worse before or during your period. °SEEK MEDICAL CARE IF:  °· You develop a fever with your period. °· Your periods are lasting more than 7 days. °· Your period is so heavy that you have to change pads or tampons every 30 minutes. °· You develop clots with your period and never had clots before. °· You cannot get relief from over-the-counter medication for your symptoms. °· Your period has not started, and it has been longer than 35 days. °  °This information is not intended to replace advice given to you by your health care provider. Make sure you discuss any questions you have with your health care provider. °  °Document Released: 01/31/2002 Document Revised: 11/01/2014 Document Reviewed: 09/09/2012 °Elsevier Interactive Patient Education ©2016 [employer] ° ° °

## 2015-04-19 NOTE — MAU Provider Note (Signed)
History     CSN: [id number]  Arrival date and time: 04/19/15 1191   First Provider Initiated Contact with Patient 04/19/15 1014      Chief Complaint  Patient presents with  . Vaginal Bleeding   HPI [name] is a 25 y.o. [phone] who presents to MAU today with complaint of possible pregnancy and vaginal bleeding. The patient had a 20 week fetal demise in mid-January. She was seen at [facility] on 2/10 and had +UPT. She was asked to follow-up at [facility]. She had hCG drawn on 04/17/15 that was negative, but states that she was not given the results. She states off and on spotting for the last few days. She was also recently treated for Chlamydia, but states partner was treated as well and they abstained from intercourse x 1 week after his treatment. She denies any abdominal pain, abnormal discharge or fever today.   OB History    Gravida Para Term Preterm AB TAB SAB Ectopic Multiple Living        Past Medical History  Diagnosis Date  . No pertinent past medical history   . H/O varicella   . Premature dilatation of cervix during pregnancy 10/01/2011    5/90%/-1  . Preterm delivery 12/04/2011    SVD at [redacted]w[redacted]d on 10/22/11  . Chlamydia Feb 2017  . Preterm labor     Past Surgical History  Procedure Laterality Date  . No past surgeries      Family History  Problem Relation Age of Onset  . Hypertension Mother   . Diabetes Brother   . Diabetes Maternal Uncle   . Cancer Maternal Grandmother     LUNG  . Diabetes Maternal Grandfather   . Arthritis Paternal Grandmother   . Cancer Paternal Grandmother     BREAST    Social History  Substance Use Topics  . Smoking status: Never Smoker   . Smokeless tobacco: Never Used  . Alcohol Use: No    Allergies: No Known Allergies  No prescriptions prior to admission    Review of Systems  Constitutional: Negative for fever and malaise/fatigue.  Gastrointestinal: Negative for nausea, vomiting, abdominal pain,  diarrhea and constipation.  Genitourinary: Negative for dysuria, urgency and frequency.       + spotting Neg - vaginal discharge   Physical Exam   Blood pressure 105/72, pulse 81, temperature 97.9 F (36.6 C), temperature source Oral, resp. rate 16, weight 111 lb 12.8 oz (50.712 kg), unknown if currently breastfeeding.  Physical Exam  Nursing note and vitals reviewed. Constitutional: She is oriented to person, place, and time. She appears well-developed and well-nourished. No distress.  HENT:  Head: Normocephalic and atraumatic.  Cardiovascular: Normal rate.   Respiratory: Effort normal.  GI: Soft. She exhibits no distension and no mass. There is no tenderness. There is no rebound and no guarding.  Genitourinary: Uterus is not enlarged and not tender. Cervix exhibits no motion tenderness, no discharge and no friability. Right adnexum displays no mass and no tenderness. Left adnexum displays no mass and no tenderness. There is bleeding (small blood pooling in the vaginal vault) in the vagina. No vaginal discharge found.  Neurological: She is alert and oriented to person, place, and time.  Skin: Skin is warm and dry. No erythema.  Psychiatric: She has a normal mood and affect.    MAU Course  Procedures None  MDM Reviewed labs from 04/17/15 indicating hCG < 2  Assessment and Plan  A: Menses  P: Discharge home Ibuprofen PRN for pain advised Bleeding precautions discussed Patient advised to follow-up with [facility] as needed Patient may return to MAU as needed or if her condition were to change or worsen   Marny Lowenstein, PA-C  04/19/2015, 11:23 AM

## 2015-04-19 NOTE — MAU Note (Addendum)
Had blood work at the clinic 2/21- no f/u yet per pt.  Hx of 20 wk IUFD on Jan 18, + urine preg test at [facility] on Feb 10. Blood test now on 2/21 was neg

## 2015-04-19 NOTE — MAU Note (Signed)
Had not received phone message or letter in regards to + Chlamydia.  Partner was treated same day, they waited a week before resuming relations.

## 2015-04-27 ENCOUNTER — Encounter (HOSPITAL_COMMUNITY): Payer: Self-pay | Admitting: *Deleted

## 2015-04-27 DIAGNOSIS — N760 Acute vaginitis: Secondary | ICD-10-CM | POA: Diagnosis not present

## 2015-04-27 DIAGNOSIS — Z8619 Personal history of other infectious and parasitic diseases: Secondary | ICD-10-CM | POA: Insufficient documentation

## 2015-04-27 DIAGNOSIS — Z3202 Encounter for pregnancy test, result negative: Secondary | ICD-10-CM | POA: Insufficient documentation

## 2015-04-27 DIAGNOSIS — Z202 Contact with and (suspected) exposure to infections with a predominantly sexual mode of transmission: Secondary | ICD-10-CM | POA: Diagnosis present

## 2015-04-27 DIAGNOSIS — Z793 Long term (current) use of hormonal contraceptives: Secondary | ICD-10-CM | POA: Insufficient documentation

## 2015-04-27 LAB — POC URINE PREG, ED: Preg Test, Ur: NEGATIVE

## 2015-04-27 NOTE — ED Notes (Signed)
The pt is c/o a vaginal discharge since she had sex with her boyfriend march 1st  lmp irreg

## 2015-04-28 ENCOUNTER — Emergency Department (HOSPITAL_COMMUNITY)
Admission: EM | Admit: 2015-04-28 | Discharge: 2015-04-28 | Disposition: A | Discharge status: Home / Self Care | Payer: Medicaid Other | Attending: Emergency Medicine | Admitting: Emergency Medicine

## 2015-04-28 DIAGNOSIS — B9689 Other specified bacterial agents as the cause of diseases classified elsewhere: Secondary | ICD-10-CM

## 2015-04-28 DIAGNOSIS — N760 Acute vaginitis: Secondary | ICD-10-CM

## 2015-04-28 LAB — URINE MICROSCOPIC-ADD ON
RBC / HPF: NONE SEEN RBC/hpf (ref 0–5)
WBC, UA: NONE SEEN WBC/hpf (ref 0–5)

## 2015-04-28 LAB — URINALYSIS, ROUTINE W REFLEX MICROSCOPIC
Glucose, UA: NEGATIVE mg/dL
Hgb urine dipstick: NEGATIVE
Ketones, ur: 15 mg/dL — AB
Nitrite: NEGATIVE
Protein, ur: NEGATIVE mg/dL
Specific Gravity, Urine: 1.026 (ref 1.005–1.030)
pH: 5.5 (ref 5.0–8.0)

## 2015-04-28 LAB — WET PREP, GENITAL
Sperm: NONE SEEN
Trich, Wet Prep: NONE SEEN
Yeast Wet Prep HPF POC: NONE SEEN

## 2015-04-28 MED ORDER — METRONIDAZOLE 500 MG PO TABS: 500.0000 mg | ORAL_TABLET | Freq: Two times a day (BID) | ORAL | Status: DC

## 2015-04-28 NOTE — Discharge Instructions (Signed)
You were diagnosed with Bacterial vaginosis. Please take Flagyl twice per day for 7 days If you develop abdominal pain, worsening vaginal irritation, fevers or chills return to the ED for re-evaluation If any of your other testing returns positive and you ned treatment, you will be called and informed

## 2015-04-28 NOTE — ED Notes (Signed)
Pt. report vaginal discharge this week , denies dysuria or fever .

## 2015-04-28 NOTE — ED Provider Notes (Signed)
CSN: [id number]     Arrival date & time 04/27/15  2256 History   First MD Initiated Contact with Patient 04/28/15 0149     Chief Complaint  Patient presents with  . Exposure to STD    HPI   25 year old female presenting for vaginal irritation 4 days. Reports white clumpy vaginal discharge. Denies foul-smelling discharge, dysuria, abdominal pain, fevers, chills. She denies nausea, vomiting or diarrhea, chest pain or SOB, headache, changes in vision Uses condoms for contraception, LMP 2 weeks ago   Past Medical History  Diagnosis Date  . No pertinent past medical history   . H/O varicella   . Premature dilatation of cervix during pregnancy 10/01/2011    5/90%/-1  . Preterm delivery 12/04/2011    SVD at 977w3d on 10/22/11  . Chlamydia Feb 2017  . Preterm labor    Past Surgical History  Procedure Laterality Date  . No past surgeries     Family History  Problem Relation Age of Onset  . Hypertension Mother   . Diabetes Brother   . Diabetes Maternal Uncle   . Cancer Maternal Grandmother     LUNG  . Diabetes Maternal Grandfather   . Arthritis Paternal Grandmother   . Cancer Paternal Grandmother     BREAST   Social History  Substance Use Topics  . Smoking status: Never Smoker   . Smokeless tobacco: Never Used  . Alcohol Use: No   OB History    Gravida Para Term Preterm AB TAB SAB Ectopic Multiple Living   2 2 0 2 0 0 0 0 0 1      Review of Systems  Constitutional: Negative.   HENT: Negative.   Eyes: Negative.   Respiratory: Negative.   Cardiovascular: Negative.   Gastrointestinal: Negative.   Skin: Negative.   Neurological: Negative.       Allergies  Review of patient's allergies indicates no known allergies.  Home Medications   Prior to Admission medications   Medication Sig Start Date End Date Taking? Authorizing Provider  Aspirin-Salicylamide-Caffeine (ARTHRITIS STRENGTH BC POWDER PO) Take 1 tablet by mouth daily as needed (for back pain).   Yes Historical  Provider, MD  ibuprofen (ADVIL,MOTRIN) 200 MG tablet Take 800 mg by mouth every 6 (six) hours as needed for headache, mild pain or moderate pain.   Yes Historical Provider, MD  Tetrahydroz-Glyc-Hyprom-PEG (VISINE MAXIMUM REDNESS RELIEF) 0.05-0.2-0.36-1 % SOLN Place 1 drop into both eyes daily as needed (for dry eyes).   Yes Historical Provider, MD  metroNIDAZOLE (FLAGYL) 500 MG tablet Take 1 tablet (500 mg total) by mouth 2 (two) times daily. 04/28/15   Tupac Jeffus A Takuma Cifelli, MD   BP 132/68 mmHg  Pulse 96  Temp(Src) 97.9 F (36.6 C) (Oral)  Resp 14  SpO2 99% Physical Exam  Constitutional: She is oriented to person, place, and time. She appears well-developed and well-nourished.  HENT:  Head: Normocephalic.  Eyes: EOM are normal. Pupils are equal, round, and reactive to light.  Cardiovascular: Normal rate and regular rhythm.   Pulmonary/Chest: Effort normal and breath sounds normal.  Abdominal: Soft. Bowel sounds are normal. She exhibits no distension. There is no tenderness. There is no rebound and no guarding.  Neurological: She is alert and oriented to person, place, and time.   Pelvic: Normal EGBUS, normal vaginal canal with white foul smelling discharge noted, normal cervix with no CMT, normal mobile uterus, normal adnexa with no masses, no adnexal tenderness   ED Course  Procedures (including critical care  time) Labs Review Labs Reviewed  WET PREP, GENITAL - Abnormal; Notable for the following:    Clue Cells Wet Prep HPF POC PRESENT (*)    WBC, Wet Prep HPF POC MANY (*)    All other components within normal limits  URINALYSIS, ROUTINE W REFLEX MICROSCOPIC (NOT AT [facility]) - Abnormal; Notable for the following:    APPearance CLOUDY (*)    Bilirubin Urine SMALL (*)    Ketones, ur 15 (*)    Leukocytes, UA SMALL (*)    All other components within normal limits  URINE MICROSCOPIC-ADD ON - Abnormal; Notable for the following:    Squamous Epithelial / LPF 0-5 (*)    Bacteria, UA RARE (*)     All other components within normal limits  URINE CULTURE  POC URINE PREG, ED  GC/CHLAMYDIA PROBE AMP ([place]) NOT AT [facility]      MDM   Final diagnoses:  Vaginitis  BV (bacterial vaginosis)    25 y/o with Bacterial vaginosis. While she is at risk for gonorrhea/chlamydia she was treated less than 1 month ago for + CT. Her partner also reports being treated. GC/CT collected but will hold on treatment until her testing results. UA neg for UTI, but UCx collected for confirmation, will follow and treat as needed. Patient discharged with prescription for flagyl  BID x7 days to treat + BV She was counseled to establish care with a PCP. Return precautions discussed  Seven [place] A. Kennon Rounds MD, MS Family Medicine Resident PGY-2 Pager [phone]    Bonney Aid, MD 04/28/15 4540  Bonney Aid, MD 04/28/15 9811  Zadie Rhine, MD 04/28/15 [phone]

## 2015-04-28 NOTE — ED Provider Notes (Signed)
Patient seen/examined in the Emergency Department in conjunction with Resident Physician Provider  Patient reports vaginal discharge Exam : awake/alert, no distress Plan: awaiting lab results, anticipate d/c home    Zadie Rhineonald Jannette Cotham, MD 04/28/15 [phone]

## 2015-04-29 LAB — URINE CULTURE

## 2015-04-30 LAB — GC/CHLAMYDIA PROBE AMP (~~LOC~~) NOT AT ARMC
Chlamydia: NEGATIVE
Neisseria Gonorrhea: NEGATIVE

## 2015-09-17 ENCOUNTER — Ambulatory Visit (HOSPITAL_COMMUNITY)
Admission: EM | Admit: 2015-09-17 | Discharge: 2015-09-17 | Disposition: A | Discharge status: Home / Self Care | Payer: Medicaid Other | Attending: Emergency Medicine | Admitting: Emergency Medicine

## 2015-09-17 ENCOUNTER — Encounter (HOSPITAL_COMMUNITY): Payer: Self-pay | Admitting: Emergency Medicine

## 2015-09-17 ENCOUNTER — Ambulatory Visit (INDEPENDENT_AMBULATORY_CARE_PROVIDER_SITE_OTHER): Payer: Medicaid Other

## 2015-09-17 DIAGNOSIS — M6588 Other synovitis and tenosynovitis, other site: Secondary | ICD-10-CM

## 2015-09-17 DIAGNOSIS — M7750 Other enthesopathy of unspecified foot: Secondary | ICD-10-CM

## 2015-09-17 MED ORDER — MELOXICAM 15 MG PO TABS: 15.0000 mg | ORAL_TABLET | Freq: Every day | ORAL | 0 refills | Status: DC

## 2015-09-17 NOTE — ED Provider Notes (Signed)
[facility]    CSN: [id number] Arrival date & time: 09/17/15  1210  First Provider Contact:  First MD Initiated Contact with Patient 09/17/15 1255        History   Chief Complaint Chief Complaint  Patient presents with  . Foot Pain  . Toe Pain    HPI Sherri Shepherd is a 25 y.o. female.   She is a 25 year old woman here for evaluation of left middle toe pain. This is been going on for at least 2 months now. She denies any known injury or trauma. She states it primarily hurts when she tries to move it or if her son accidentally steps on it. No numbness or tingling. No swelling or bruising.    Foot Pain   Toe Pain     Past Medical History:  Diagnosis Date  . Chlamydia Feb 2017  . H/O varicella   . No pertinent past medical history   . Premature dilatation of cervix during pregnancy 10/01/2011   5/90%/-1  . Preterm delivery 12/04/2011   SVD at [redacted]w[redacted]d on 10/22/11  . Preterm labor     Patient Active Problem List   Diagnosis Date Noted  . Preterm premature rupture of membranes (PPROM) delivered, current hospitalization 03/13/2015  . History of preterm delivery 03/13/2015  . History of chlamydia infection 10/02/2011  . Premature dilatation of cervix during pregnancy 10/01/2011    Past Surgical History:  Procedure Laterality Date  . NO PAST SURGERIES      OB History    Gravida Para Term Preterm AB Living   2 2 0 2 0 1   SAB TAB Ectopic Multiple Live Births   0 0 0 0         Home Medications    Prior to Admission medications   Medication Sig Start Date End Date Taking? Authorizing Provider  Aspirin-Salicylamide-Caffeine (ARTHRITIS STRENGTH BC POWDER PO) Take 1 tablet by mouth daily as needed (for back pain).    Historical Provider, MD  ibuprofen (ADVIL,MOTRIN) 200 MG tablet Take 800 mg by mouth every 6 (six) hours as needed for headache, mild pain or moderate pain.    Historical Provider, MD  meloxicam (MOBIC) 15 MG tablet Take 1 tablet (15 mg  total) by mouth daily. For 1 week, then as needed for pain 09/17/15   Charm Rings, MD  Tetrahydroz-Glyc-Hyprom-PEG (VISINE MAXIMUM REDNESS RELIEF) 0.05-0.2-0.36-1 % SOLN Place 1 drop into both eyes daily as needed (for dry eyes).    Historical Provider, MD    Family History Family History  Problem Relation Age of Onset  . Hypertension Mother   . Diabetes Brother   . Cancer Maternal Grandmother     LUNG  . Diabetes Maternal Grandfather   . Arthritis Paternal Grandmother   . Cancer Paternal Grandmother     BREAST  . Diabetes Maternal Uncle     Social History Social History  Substance Use Topics  . Smoking status: Never Smoker  . Smokeless tobacco: Never Used  . Alcohol use No     Allergies   Review of patient's allergies indicates no known allergies.   Review of Systems Review of Systems  Musculoskeletal: Positive for arthralgias (toe pain).     Physical Exam Triage Vital Signs ED Triage Vitals  Enc Vitals Group     BP 09/17/15 1245 129/83     Pulse Rate 09/17/15 1245 64     Resp 09/17/15 1245 14     Temp 09/17/15 1245 98.3  F (36.8 C)     Temp Source 09/17/15 1245 Oral     SpO2 09/17/15 1245 100 %     Weight --      Height --      Head Circumference --      Peak Flow --      Pain Score 09/17/15 1257 0     Pain Loc --      Pain Edu? --      Excl. in GC? --    No data found.   Updated Vital Signs BP 129/83 (BP Location: Left Arm)   Pulse 64   Temp 98.3 F (36.8 C) (Oral)   Resp 14   LMP 08/20/2015   SpO2 100%   Visual Acuity Right Eye Distance:   Left Eye Distance:   Bilateral Distance:    Right Eye Near:   Left Eye Near:    Bilateral Near:     Physical Exam  Constitutional: She is oriented to person, place, and time. She appears well-developed and well-nourished. No distress.  Cardiovascular: Normal rate.   Pulmonary/Chest: Effort normal.  Musculoskeletal:  Left foot: No erythema or edema. No bruising. 2+ DP pulse. No bony tenderness of  the third toe. She does have pain with both active and passive movement of the third toe. She does have a somewhat prominent proximal phalanx to palpation of the third toe.  Neurological: She is alert and oriented to person, place, and time.     UC Treatments / Results  Labs (all labs ordered are listed, but only abnormal results are displayed) Labs Reviewed - No data to display  EKG  EKG Interpretation None       Radiology Dg Foot Complete Left  Result Date: 09/17/2015 CLINICAL DATA:  Third toe pain. EXAM: LEFT FOOT - COMPLETE 3+ VIEW COMPARISON:  None. FINDINGS: There is no evidence of fracture or dislocation. There is no evidence of arthropathy or other focal bone abnormality. Soft tissues are unremarkable. IMPRESSION: Negative. Electronically Signed   By: Ted Mcalpine M.D.   On: 09/17/2015 13:43   Procedures Procedures (including critical care time)  Medications Ordered in UC Medications - No data to display   Initial Impression / Assessment and Plan / UC Course  I have reviewed the triage vital signs and the nursing notes.  Pertinent labs & imaging results that were available during my care of the patient were reviewed by me and considered in my medical decision making (see chart for details).  Clinical Course    X-rays negative. Likely tendinitis. Treat with postop shoe, meloxicam, and ice. Follow-up as needed.  Final Clinical Impressions(s) / UC Diagnoses   Final diagnoses:  Tendonitis of foot    New Prescriptions New Prescriptions   MELOXICAM (MOBIC) 15 MG TABLET    Take 1 tablet (15 mg total) by mouth daily. For 1 week, then as needed for pain     Charm Rings, MD 09/17/15 1406

## 2015-09-17 NOTE — Discharge Instructions (Signed)
Your x-ray is normal. The tendon in your toe is irritated, which is causing your pain. Take meloxicam daily for 1 week, then as needed for pain. Do not take ibuprofen with this medicine. Wear the postop shoe when you are moving around for the next 3-5 days. If you can ice your toe several times a day, that will also help. Follow-up as needed.

## 2016-04-24 ENCOUNTER — Encounter (HOSPITAL_COMMUNITY): Payer: Self-pay

## 2016-04-24 ENCOUNTER — Emergency Department (HOSPITAL_COMMUNITY)
Admission: EM | Admit: 2016-04-24 | Discharge: 2016-04-24 | Disposition: A | Discharge status: Home / Self Care | Payer: Medicaid Other | Attending: Emergency Medicine | Admitting: Emergency Medicine

## 2016-04-24 DIAGNOSIS — O26891 Other specified pregnancy related conditions, first trimester: Secondary | ICD-10-CM | POA: Insufficient documentation

## 2016-04-24 DIAGNOSIS — Z3A01 Less than 8 weeks gestation of pregnancy: Secondary | ICD-10-CM | POA: Insufficient documentation

## 2016-04-24 DIAGNOSIS — Z3490 Encounter for supervision of normal pregnancy, unspecified, unspecified trimester: Secondary | ICD-10-CM

## 2016-04-24 LAB — I-STAT BETA HCG BLOOD, ED (MC, WL, AP ONLY): I-stat hCG, quantitative: >2000 m[IU]/mL — ABNORMAL HIGH (ref <5)

## 2016-04-24 MED ORDER — PRENATAL MULTIVITAMIN CH: 1.0000 | ORAL_TABLET | Freq: Every day | ORAL | 0 refills | Status: DC

## 2016-04-24 NOTE — ED Provider Notes (Signed)
[facility] DEPT Provider Note   CSN: [id number] Arrival date & time: 04/24/16  1605     History   Chief Complaint Chief Complaint  Patient presents with  . Possible Pregnancy    HPI Sherri Shepherd is a 26 y.o. female.  HPI  Patient presents with concern of possible pregnancy. Patient notes lmp about one month ago, and she has no ongoing menses, nor any vaginal bleeding, discharge, pain.SHE HAS HAD OCCASIONAL LOWER ABDOMINAL CRAMPINESS, THOUGH NONE CURRENTLY, NOR ANY PAIN. sHE ALSO DENIES NAUSEA, LIGHTHEADEDNESS, VOMITING. sHE HAS 2 PRIOR PREGNANCIES, ONE LIVE BIRTH. sHE HAS NOT SEEN HER OBSTETRICIAN GYNECOLOGIST, NOR ANOTHER PHYSICIAN WITH THESE CONCERNS.    Past Medical History:  Diagnosis Date  . Chlamydia Feb 2017  . H/O varicella   . No pertinent past medical history   . Premature dilatation of cervix during pregnancy 10/01/2011   5/90%/-1  . Preterm delivery 12/04/2011   SVD at 6669w3d on 10/22/11  . Preterm labor     Patient Active Problem List   Diagnosis Date Noted  . Preterm premature rupture of membranes (PPROM) delivered, current hospitalization 03/13/2015  . History of preterm delivery 03/13/2015  . History of chlamydia infection 10/02/2011  . Premature dilatation of cervix during pregnancy 10/01/2011    Past Surgical History:  Procedure Laterality Date  . NO PAST SURGERIES      OB History    Gravida Para Term Preterm AB Living   3 2 0 2 0 1   SAB TAB Ectopic Multiple Live Births   0 0 0 0 1       Home Medications    Prior to Admission medications   Medication Sig Start Date End Date Taking? Authorizing Provider  Prenatal Vit-Fe Fumarate-FA (PRENATAL MULTIVITAMIN) TABS tablet Take 1 tablet by mouth daily at 12 noon. 04/24/16   Gerhard Munchobert Twania Bujak, MD  Tetrahydroz-Glyc-Hyprom-PEG (VISINE MAXIMUM REDNESS RELIEF) 0.05-0.2-0.36-1 % SOLN Place 1 drop into both eyes daily as needed (for dry eyes).    Historical Provider, MD    Family  History Family History  Problem Relation Age of Onset  . Hypertension Mother   . Diabetes Brother   . Cancer Maternal Grandmother     LUNG  . Diabetes Maternal Grandfather   . Arthritis Paternal Grandmother   . Cancer Paternal Grandmother     BREAST  . Diabetes Maternal Uncle     Social History Social History  Substance Use Topics  . Smoking status: Never Smoker  . Smokeless tobacco: Never Used  . Alcohol use No     Allergies   Patient has no known allergies.   Review of Systems Review of Systems  Constitutional:       Per HPI, otherwise negative  HENT:       Per HPI, otherwise negative  Respiratory:       Per HPI, otherwise negative  Cardiovascular:       Per HPI, otherwise negative  Gastrointestinal: Negative for vomiting.  Endocrine:       Negative aside from HPI  Genitourinary:       Neg aside from HPI   Musculoskeletal:       Per HPI, otherwise negative  Skin: Negative.   Allergic/Immunologic: Negative for immunocompromised state.  Neurological: Negative for syncope.     Physical Exam Updated Vital Signs BP 129/71 (BP Location: Left Arm)   Pulse 99   Temp 98.6 F (37 C) (Oral)   Resp 16   Ht 5\' 5"  (1.651 m)  Wt 118 lb (53.5 kg)   LMP 03/15/2016   SpO2 100%   BMI 19.64 kg/m   Physical Exam  Constitutional: She is oriented to person, place, and time. She appears well-developed and well-nourished. No distress.  HENT:  Head: Normocephalic and atraumatic.  Eyes: Conjunctivae and EOM are normal.  Cardiovascular: Normal rate and regular rhythm.   Pulmonary/Chest: Effort normal and breath sounds normal. No stridor. No respiratory distress.  Abdominal: She exhibits no distension and no mass. There is no tenderness. There is no guarding.  Musculoskeletal: She exhibits no edema.  Neurological: She is alert and oriented to person, place, and time. No cranial nerve deficit.  Skin: Skin is warm and dry.  Psychiatric: She has a normal mood and affect.   Nursing note and vitals reviewed.    ED Treatments / Results  Labs (all labs ordered are listed, but only abnormal results are displayed) Labs Reviewed  I-STAT BETA HCG BLOOD, ED ([facility], [facility], [facility] ONLY) - Abnormal; Notable for the following:       Result Value   I-stat hCG, quantitative >2,000.0 (*)    All other components within normal limits    Procedures Procedures (including critical care time)    Initial Impression / Assessment and Plan / ED Course  I have reviewed the triage vital signs and the nursing notes.  Pertinent labs & imaging results that were available during my care of the patient were reviewed by me and considered in my medical decision making (see chart for details).  YOUNG FEMALE WITH 2 PRIOR PREGNANCIES PRESENTS WITH CONCERN OF POSSIBLE PREGNANCY. pATIENT HAS NO ONGOING ABDOMINAL PAIN, NO EVIDENCE FOR DISTRESS, NO HEMODYNAMIC INSTABILITY, NO SUSPICION FOR IMPENDING ECTOPIC DISRUPTION, NOR OTHER ACUTE NEW ABDOMINAL PATHOLOGY. pATIENT IS FOUND TO HAVE POSITIVE PREGNANCY TEST. pATIENT HAS AN OBSTETRICIAN WITH HIM SHE WILL FOLLOW-UP sHE STARTED ON PRENATAL VITAMINS.  Final Clinical Impressions(s) / ED Diagnoses   Final diagnoses:  Pregnancy, unspecified gestational age    [place] Prescriptions New Prescriptions   PRENATAL VIT-FE FUMARATE-FA (PRENATAL MULTIVITAMIN) TABS TABLET    Take 1 tablet by mouth daily at 12 noon.     Gerhard Munch, MD 04/24/16 [phone]

## 2016-04-24 NOTE — ED Triage Notes (Signed)
Per Pt, Pt is coming from home with request for a pregnancy test. Pt missed her period two weeks ago and has reported some morning sickness and tender breasts. Took a home pregnancy that was positive.

## 2016-04-24 NOTE — Discharge Instructions (Signed)
As discussed, your evaluation today has been largely reassuring.  But, it is important that you monitor your condition carefully, and do not hesitate to return to the ED if you develop new, or concerning changes in your condition. ? ?Otherwise, please follow-up with your physician for appropriate ongoing care. ? ?

## 2016-04-30 ENCOUNTER — Inpatient Hospital Stay (HOSPITAL_COMMUNITY)
Admission: AD | Admit: 2016-04-30 | Discharge: 2016-04-30 | Disposition: A | Discharge status: Home / Self Care | Payer: Self-pay | Source: Ambulatory Visit | Attending: Obstetrics & Gynecology | Admitting: Obstetrics & Gynecology

## 2016-04-30 ENCOUNTER — Inpatient Hospital Stay (HOSPITAL_COMMUNITY): Payer: Self-pay

## 2016-04-30 ENCOUNTER — Encounter: Payer: Self-pay | Admitting: Medical

## 2016-04-30 DIAGNOSIS — O26859 Spotting complicating pregnancy, unspecified trimester: Secondary | ICD-10-CM

## 2016-04-30 DIAGNOSIS — O26891 Other specified pregnancy related conditions, first trimester: Secondary | ICD-10-CM | POA: Insufficient documentation

## 2016-04-30 DIAGNOSIS — Z79899 Other long term (current) drug therapy: Secondary | ICD-10-CM | POA: Insufficient documentation

## 2016-04-30 DIAGNOSIS — N939 Abnormal uterine and vaginal bleeding, unspecified: Secondary | ICD-10-CM | POA: Insufficient documentation

## 2016-04-30 DIAGNOSIS — Z801 Family history of malignant neoplasm of trachea, bronchus and lung: Secondary | ICD-10-CM | POA: Insufficient documentation

## 2016-04-30 DIAGNOSIS — O209 Hemorrhage in early pregnancy, unspecified: Secondary | ICD-10-CM

## 2016-04-30 DIAGNOSIS — Z833 Family history of diabetes mellitus: Secondary | ICD-10-CM | POA: Insufficient documentation

## 2016-04-30 DIAGNOSIS — Z8249 Family history of ischemic heart disease and other diseases of the circulatory system: Secondary | ICD-10-CM | POA: Insufficient documentation

## 2016-04-30 DIAGNOSIS — Z803 Family history of malignant neoplasm of breast: Secondary | ICD-10-CM | POA: Insufficient documentation

## 2016-04-30 DIAGNOSIS — Z3A01 Less than 8 weeks gestation of pregnancy: Secondary | ICD-10-CM | POA: Insufficient documentation

## 2016-04-30 LAB — CBC WITH DIFFERENTIAL/PLATELET
Basophils Absolute: 0.1 10*3/uL (ref 0.0–0.1)
Basophils Relative: 1 %
EOS PCT: 1 %
Eosinophils Absolute: 0.1 10*3/uL (ref 0.0–0.7)
HEMATOCRIT: 37.3 % (ref 36.0–46.0)
Hemoglobin: 12.5 g/dL (ref 12.0–15.0)
LYMPHS ABS: 2.8 10*3/uL (ref 0.7–4.0)
LYMPHS PCT: 23 %
MCH: 31.1 pg (ref 26.0–34.0)
MCHC: 33.5 g/dL (ref 30.0–36.0)
MCV: 92.8 fL (ref 78.0–100.0)
Monocytes Absolute: 0.5 10*3/uL (ref 0.1–1.0)
Monocytes Relative: 4 %
NEUTROS ABS: 8.4 10*3/uL — AB (ref 1.7–7.7)
Neutrophils Relative %: 71 %
PLATELETS: 172 10*3/uL (ref 150–400)
RBC: 4.02 MIL/uL (ref 3.87–5.11)
RDW: 14.2 % (ref 11.5–15.5)
WBC: 11.9 10*3/uL — AB (ref 4.0–10.5)

## 2016-04-30 LAB — URINALYSIS, ROUTINE W REFLEX MICROSCOPIC
BILIRUBIN URINE: NEGATIVE
GLUCOSE, UA: NEGATIVE mg/dL
HGB URINE DIPSTICK: NEGATIVE
KETONES UR: NEGATIVE mg/dL
Leukocytes, UA: NEGATIVE
Nitrite: NEGATIVE
PH: 5 (ref 5.0–8.0)
Protein, ur: NEGATIVE mg/dL
SPECIFIC GRAVITY, URINE: 1.032 — AB (ref 1.005–1.030)

## 2016-04-30 LAB — WET PREP, GENITAL
CLUE CELLS WET PREP: NONE SEEN
SPERM: NONE SEEN
Trich, Wet Prep: NONE SEEN
Yeast Wet Prep HPF POC: NONE SEEN

## 2016-04-30 LAB — HCG, QUANTITATIVE, PREGNANCY: hCG, Beta Chain, Quant, S: 117847 m[IU]/mL — ABNORMAL HIGH (ref <5)

## 2016-04-30 NOTE — MAU Provider Note (Signed)
History     CSN: [id number]  Arrival date and time: 04/30/16 1714   First Provider Initiated Contact with Patient 04/30/16 1743      Chief Complaint  Patient presents with  . Vaginal Bleeding   HPI [name] is a 26 y.o. [phone] at [redacted]w[redacted]d who presents to MAU today with complaint of spotting. The patient states that she noted bleeding earlier today with wiping only. She has not needed a pad. She denies abdominal pain or vaginal discharge. She has had some nausea without vomiting or diarrhea. She denies fever. She had pregnancy confirmed at ED recently, but was not having pain or bleeding then so [place] was not done. She has an appointment to start prenatal care with [facility] in April.   OB History    Gravida Para Term Preterm AB Living   3 2 0 2 0 1   SAB TAB Ectopic Multiple Live Births   0 0 0 0 1      Past Medical History:  Diagnosis Date  . Chlamydia Feb 2017  . H/O varicella   . No pertinent past medical history   . Premature dilatation of cervix during pregnancy 10/01/2011   5/90%/-1  . Preterm delivery 12/04/2011   SVD at [redacted]w[redacted]d on 10/22/11  . Preterm labor     Past Surgical History:  Procedure Laterality Date  . NO PAST SURGERIES      Family History  Problem Relation Age of Onset  . Hypertension Mother   . Diabetes Brother   . Cancer Maternal Grandmother     LUNG  . Diabetes Maternal Grandfather   . Arthritis Paternal Grandmother   . Cancer Paternal Grandmother     BREAST  . Diabetes Maternal Uncle     Social History  Substance Use Topics  . Smoking status: Never Smoker  . Smokeless tobacco: Never Used  . Alcohol use No    Allergies: No Known Allergies  Prescriptions Prior to Admission  Medication Sig Dispense Refill Last Dose  . Prenatal Vit-Fe Fumarate-FA (PRENATAL MULTIVITAMIN) TABS tablet Take 1 tablet by mouth daily at 12 noon. 30 tablet 0 04/30/2016 at Unknown time    Review of Systems  Constitutional: Negative for fever.   Gastrointestinal: Positive for nausea. Negative for abdominal pain, constipation, diarrhea and vomiting.  Genitourinary: Positive for vaginal bleeding. Negative for dysuria, frequency, urgency and vaginal discharge.   Physical Exam   Blood pressure 122/75, pulse 76, temperature 98.7 F (37.1 C), temperature source Oral, resp. rate 16, height 5\' 4"  (1.626 m), weight 119 lb 12.8 oz (54.3 kg), last menstrual period 03/24/2016, SpO2 100 %, unknown if currently breastfeeding.  Physical Exam  Nursing note and vitals reviewed. Constitutional: She is oriented to person, place, and time. She appears well-developed and well-nourished. No distress.  HENT:  Head: Normocephalic and atraumatic.  Cardiovascular: Normal rate.   Respiratory: Effort normal.  GI: Soft. She exhibits no distension and no mass. There is no tenderness. There is no rebound and no guarding.  Genitourinary: Uterus is not enlarged and not tender. Cervix exhibits friability (mild). Cervix exhibits no motion tenderness and no discharge. Right adnexum displays no mass and no tenderness. Left adnexum displays no mass and no tenderness. There is bleeding (scant) in the vagina. Vaginal discharge (thin, white) found.  Neurological: She is alert and oriented to person, place, and time.  Skin: Skin is warm and dry. No erythema.  Psychiatric: She has a normal mood and affect.    Results  for orders placed or performed during the hospital encounter of 04/30/16 (from the past 24 hour(s))  Urinalysis, Routine w reflex microscopic     Status: Abnormal   Collection Time: 04/30/16  5:26 PM  Result Value Ref Range   Color, Urine YELLOW YELLOW   APPearance CLEAR CLEAR   Specific Gravity, Urine 1.032 (H) 1.005 - 1.030   pH 5.0 5.0 - 8.0   Glucose, UA NEGATIVE NEGATIVE mg/dL   Hgb urine dipstick NEGATIVE NEGATIVE   Bilirubin Urine NEGATIVE NEGATIVE   Ketones, ur NEGATIVE NEGATIVE mg/dL   Protein, ur NEGATIVE NEGATIVE mg/dL   Nitrite NEGATIVE  NEGATIVE   Leukocytes, UA NEGATIVE NEGATIVE  Wet prep, genital     Status: Abnormal   Collection Time: 04/30/16  6:00 PM  Result Value Ref Range   Yeast Wet Prep HPF POC NONE SEEN NONE SEEN   Trich, Wet Prep NONE SEEN NONE SEEN   Clue Cells Wet Prep HPF POC NONE SEEN NONE SEEN   WBC, Wet Prep HPF POC MANY (A) NONE SEEN   Sperm NONE SEEN   CBC with Differential/Platelet     Status: Abnormal   Collection Time: 04/30/16  6:06 PM  Result Value Ref Range   WBC 11.9 (H) 4.0 - 10.5 K/uL   RBC 4.02 3.87 - 5.11 MIL/uL   Hemoglobin 12.5 12.0 - 15.0 g/dL   HCT 86.5 78.4 - 69.6 %   MCV 92.8 78.0 - 100.0 fL   MCH 31.1 26.0 - 34.0 pg   MCHC 33.5 30.0 - 36.0 g/dL   RDW 29.5 28.4 - 13.2 %   Platelets 172 150 - 400 K/uL   Neutrophils Relative % 71 %   Neutro Abs 8.4 (H) 1.7 - 7.7 K/uL   Lymphocytes Relative 23 %   Lymphs Abs 2.8 0.7 - 4.0 K/uL   Monocytes Relative 4 %   Monocytes Absolute 0.5 0.1 - 1.0 K/uL   Eosinophils Relative 1 %   Eosinophils Absolute 0.1 0.0 - 0.7 K/uL   Basophils Relative 1 %   Basophils Absolute 0.1 0.0 - 0.1 K/uL  hCG, quantitative, pregnancy     Status: Abnormal   Collection Time: 04/30/16  6:06 PM  Result Value Ref Range   hCG, Beta Chain, Quant, S 117,847 (H) <5 mIU/mL   US Ob Comp Less 14 Wks  Result Date: 04/30/2016 CLINICAL DATA:  Vaginal bleeding, gestational age by last menstrual period 5 weeks and 2 days. EXAM: OBSTETRIC <14 WK [place] AND TRANSVAGINAL OB US TECHNIQUE: Both transabdominal and transvaginal ultrasound examinations were performed for complete evaluation of the gestation as well as the maternal uterus, adnexal regions, and pelvic cul-de-sac. Transvaginal technique was performed to assess early pregnancy. COMPARISON:  None. FINDINGS: Intrauterine gestational sac: Present Yolk sac:  Present Embryo:  Present Cardiac Activity: Present Heart Rate: 140  bpm CRL:  12.7  mm   7 w   3 d                  [place] EDC: December 14, 2016 Subchorionic hemorrhage:  None  visualized. Maternal uterus/adnexae: Normal appearance of the adnexae. IMPRESSION: Single live intrauterine pregnancy, gestational age by ultrasound 7 weeks and 3 days, no immediate complication. Electronically Signed   By: Awilda Metro M.D.   On: 04/30/2016 19:18   US Ob Transvaginal  Result Date: 04/30/2016 CLINICAL DATA:  Vaginal bleeding, gestational age by last menstrual period 5 weeks and 2 days. EXAM: OBSTETRIC <14 WK [place] AND TRANSVAGINAL OB  US TECHNIQUE: Both transabdominal and transvaginal ultrasound examinations were performed for complete evaluation of the gestation as well as the maternal uterus, adnexal regions, and pelvic cul-de-sac. Transvaginal technique was performed to assess early pregnancy. COMPARISON:  None. FINDINGS: Intrauterine gestational sac: Present Yolk sac:  Present Embryo:  Present Cardiac Activity: Present Heart Rate: 140  bpm CRL:  12.7  mm   7 w   3 d                  US EDC: December 14, 2016 Subchorionic hemorrhage:  None visualized. Maternal uterus/adnexae: Normal appearance of the adnexae. IMPRESSION: Single live intrauterine pregnancy, gestational age by ultrasound 7 weeks and 3 days, no immediate complication. Electronically Signed   By: Awilda Metroourtnay  Bloomer M.D.   On: 04/30/2016 19:18    MAU Course  Procedures None   MDM +hCG in Epic from ED on 04/24/16 UA, wet prep, GC/chlamydia, CBC, quant hCG, HIV, RPR and US today to rule out ectopic pregnancy O+ blood type in Epic from previous visit  Assessment and Plan  A: [facility] at 4655w3d Spotting in pregnancy, first trimester  P: Discharge home Bleeding precautions discussed Patient advised to follow-up with [facility] as scheduled for routine prenatal care Patient may return to [facility]U as needed or if her condition were to change or worsen   Marny LowensteinJulie N Ethne Jeon, PA-C  04/30/2016, 7:34 PM

## 2016-04-30 NOTE — Discharge Instructions (Signed)
Vaginal Bleeding During Pregnancy, First Trimester °A small amount of bleeding (spotting) from the vagina is common in early pregnancy. Sometimes the bleeding is normal and is not a problem, and sometimes it is a sign of something serious. Be sure to tell your doctor about any bleeding from your vagina right away. °Follow these instructions at home: °· Watch your condition for any changes. °· Follow your doctor's instructions about how active you can be. °· If you are on bed rest: °¨ You may need to stay in bed and only get up to use the bathroom. °¨ You may be allowed to do some activities. °¨ If you need help, make plans for someone to help you. °· Write down: °¨ The number of pads you use each day. °¨ How often you change pads. °¨ How soaked (saturated) your pads are. °· Do not use tampons. °· Do not douche. °· Do not have sex or orgasms until your doctor says it is okay. °· If you pass any tissue from your vagina, save the tissue so you can show it to your doctor. °· Only take medicines as told by your doctor. °· Do not take aspirin because it can make you bleed. °· Keep all follow-up visits as told by your doctor. °Contact a doctor if: °· You bleed from your vagina. °· You have cramps. °· You have labor pains. °· You have a fever that does not go away after you take medicine. °Get help right away if: °· You have very bad cramps in your back or belly (abdomen). °· You pass large clots or tissue from your vagina. °· You bleed more. °· You feel light-headed or weak. °· You pass out (faint). °· You have chills. °· You are leaking fluid or have a gush of fluid from your vagina. °· You pass out while pooping (having a bowel movement). °This information is not intended to replace advice given to you by your health care provider. Make sure you discuss any questions you have with your health care provider. °Document Released: 06/27/2013 Document Revised: 07/19/2015 Document Reviewed: 10/18/2012 °Elsevier Interactive  Patient Education © 2017 [employer] ° ° °Pelvic Rest °Pelvic rest may be recommended if: °· Your placenta is partially or completely covering the opening of your cervix (placenta previa). °· There is bleeding between the wall of the uterus and the amniotic sac in the first trimester of pregnancy (subchorionic hemorrhage). °· You went into labor too early (preterm labor). °Based on your overall health and the health of your baby, your health care provider will decide if pelvic rest is right for you. °How do I rest my pelvis? °For as long as told by your health care provider: °· Do not have sex, sexual stimulation, or an orgasm. °· Do not use tampons. Do not douche. Do not put anything in your vagina. °· Do not lift anything that is heavier than 10 lb (4.5 kg). °· Avoid activities that take a lot of effort (are strenuous). °· Avoid any activity in which your pelvic muscles could become strained. °When should I seek medical care? °Seek medical care if you have: °· Cramping pain in your lower abdomen. °· Vaginal discharge. °· A low, dull backache. °· Regular contractions. °· Uterine tightening. °When should I seek immediate medical care? °Seek immediate medical care if: °· You have vaginal bleeding and you are pregnant. °This information is not intended to replace advice given to you by your health care provider. Make sure you discuss any   questions you have with your health care provider. °Document Released: 06/07/2010 Document Revised: 07/19/2015 Document Reviewed: 08/14/2014 °Elsevier Interactive Patient Education © 2017 [employer] ° °

## 2016-04-30 NOTE — MAU Note (Signed)
Red spotting this morning.  Noted dried blood on leg this afternoon.  Denies any pain.   [facility] has appt 4/12 at clinic.

## 2016-05-01 LAB — RPR: RPR: NONREACTIVE

## 2016-05-01 LAB — GC/CHLAMYDIA PROBE AMP (~~LOC~~) NOT AT ARMC
CHLAMYDIA, DNA PROBE: POSITIVE — AB
Neisseria Gonorrhea: NEGATIVE

## 2016-05-01 LAB — HIV ANTIBODY (ROUTINE TESTING W REFLEX): HIV Screen 4th Generation wRfx: NONREACTIVE

## 2016-05-03 ENCOUNTER — Telehealth (HOSPITAL_COMMUNITY): Payer: Self-pay | Admitting: *Deleted

## 2016-05-03 ENCOUNTER — Other Ambulatory Visit: Payer: Self-pay | Admitting: Medical

## 2016-05-03 DIAGNOSIS — O98811 Other maternal infectious and parasitic diseases complicating pregnancy, first trimester: Principal | ICD-10-CM

## 2016-05-03 DIAGNOSIS — A749 Chlamydial infection, unspecified: Secondary | ICD-10-CM

## 2016-05-03 MED ORDER — AZITHROMYCIN 250 MG PO TABS: 1000.0000 mg | ORAL_TABLET | Freq: Once | ORAL | 0 refills | Status: AC

## 2016-05-26 NOTE — Telephone Encounter (Signed)

## 2016-06-04 ENCOUNTER — Encounter: Payer: Self-pay | Admitting: Obstetrics and Gynecology

## 2016-06-04 DIAGNOSIS — O0990 Supervision of high risk pregnancy, unspecified, unspecified trimester: Secondary | ICD-10-CM | POA: Insufficient documentation

## 2016-06-05 ENCOUNTER — Ambulatory Visit (INDEPENDENT_AMBULATORY_CARE_PROVIDER_SITE_OTHER): Payer: 59 | Admitting: Obstetrics and Gynecology

## 2016-06-05 ENCOUNTER — Encounter: Payer: Self-pay | Admitting: Obstetrics and Gynecology

## 2016-06-05 ENCOUNTER — Encounter: Payer: Self-pay | Admitting: General Practice

## 2016-06-05 VITALS — BP 113/79 | HR 96 | Wt 123.1 lb

## 2016-06-05 DIAGNOSIS — Z8751 Personal history of pre-term labor: Secondary | ICD-10-CM

## 2016-06-05 DIAGNOSIS — Z113 Encounter for screening for infections with a predominantly sexual mode of transmission: Secondary | ICD-10-CM | POA: Diagnosis not present

## 2016-06-05 DIAGNOSIS — A749 Chlamydial infection, unspecified: Secondary | ICD-10-CM | POA: Insufficient documentation

## 2016-06-05 DIAGNOSIS — O09212 Supervision of pregnancy with history of pre-term labor, second trimester: Secondary | ICD-10-CM

## 2016-06-05 DIAGNOSIS — O0992 Supervision of high risk pregnancy, unspecified, second trimester: Secondary | ICD-10-CM

## 2016-06-05 DIAGNOSIS — O98811 Other maternal infectious and parasitic diseases complicating pregnancy, first trimester: Secondary | ICD-10-CM

## 2016-06-05 DIAGNOSIS — Z124 Encounter for screening for malignant neoplasm of cervix: Secondary | ICD-10-CM

## 2016-06-05 DIAGNOSIS — O98312 Other infections with a predominantly sexual mode of transmission complicating pregnancy, second trimester: Secondary | ICD-10-CM

## 2016-06-05 DIAGNOSIS — O0990 Supervision of high risk pregnancy, unspecified, unspecified trimester: Secondary | ICD-10-CM

## 2016-06-05 LAB — POCT URINALYSIS DIP (DEVICE)
Bilirubin Urine: NEGATIVE
GLUCOSE, UA: NEGATIVE mg/dL
KETONES UR: NEGATIVE mg/dL
Nitrite: NEGATIVE
PROTEIN: NEGATIVE mg/dL
SPECIFIC GRAVITY, URINE: 1.02 (ref 1.005–1.030)
Urobilinogen, UA: 0.2 mg/dL (ref 0.0–1.0)
pH: 7 (ref 5.0–8.0)

## 2016-06-05 NOTE — Patient Instructions (Addendum)
 First Trimester of Pregnancy The first trimester of pregnancy is from week 1 until the end of week 13 (months 1 through 3). A week after a sperm fertilizes an egg, the egg will implant on the wall of the uterus. This embryo will begin to develop into a baby. Genes from you and your partner will form the baby. The female genes will determine whether the baby will be a boy or a girl. At 6-8 weeks, the eyes and face will be formed, and the heartbeat can be seen on ultrasound. At the end of 12 weeks, all the baby's organs will be formed. Now that you are pregnant, you will want to do everything you can to have a healthy baby. Two of the most important things are to get good prenatal care and to follow your health care provider's instructions. Prenatal care is all the medical care you receive before the baby's birth. This care will help prevent, find, and treat any problems during the pregnancy and childbirth. Body changes during your first trimester Your body goes through many changes during pregnancy. The changes vary from woman to woman.  You may gain or lose a couple of pounds at first.  You may feel sick to your stomach (nauseous) and you may throw up (vomit). If the vomiting is uncontrollable, call your health care provider.  You may tire easily.  You may develop headaches that can be relieved by medicines. All medicines should be approved by your health care provider.  You may urinate more often. Painful urination may mean you have a bladder infection.  You may develop heartburn as a result of your pregnancy.  You may develop constipation because certain hormones are causing the muscles that push stool through your intestines to slow down.  You may develop hemorrhoids or swollen veins (varicose veins).  Your breasts may begin to grow larger and become tender. Your nipples may stick out more, and the tissue that surrounds them (areola) may become darker.  Your gums may bleed and may be  sensitive to brushing and flossing.  Dark spots or blotches (chloasma, mask of pregnancy) may develop on your face. This will likely fade after the baby is born.  Your menstrual periods will stop.  You may have a loss of appetite.  You may develop cravings for certain kinds of food.  You may have changes in your emotions from day to day, such as being excited to be pregnant or being concerned that something may go wrong with the pregnancy and baby.  You may have more vivid and strange dreams.  You may have changes in your hair. These can include thickening of your hair, rapid growth, and changes in texture. Some women also have hair loss during or after pregnancy, or hair that feels dry or thin. Your hair will most likely return to normal after your baby is born. What to expect at prenatal visits During a routine prenatal visit:  You will be weighed to make sure you and the baby are growing normally.  Your blood pressure will be taken.  Your abdomen will be measured to track your baby's growth.  The fetal heartbeat will be listened to between weeks 10 and 14 of your pregnancy.  Test results from any previous visits will be discussed. Your health care provider may ask you:  How you are feeling.  If you are feeling the baby move.  If you have had any abnormal symptoms, such as leaking fluid, bleeding, severe headaches, or   headaches, or abdominal cramping.  If you are using any tobacco products, including cigarettes, chewing tobacco, and electronic cigarettes.  If you have any questions.  Other tests that may be performed during your first trimester include:  Blood tests to find your blood type and to check for the presence of any previous infections. The tests will also be used to check for low iron levels (anemia) and protein on red blood cells (Rh antibodies). Depending on your risk factors, or if you previously had diabetes during pregnancy, you may have tests to check for high blood  sugar that affects pregnant women (gestational diabetes).  Urine tests to check for infections, diabetes, or protein in the urine.  An ultrasound to confirm the proper growth and development of the baby.  Fetal screens for spinal cord problems (spina bifida) and Down syndrome.  HIV (human immunodeficiency virus) testing. Routine prenatal testing includes screening for HIV, unless you choose not to have this test.  You may need other tests to make sure you and the baby are doing well.  Follow these instructions at home: Medicines  Follow your health care provider's instructions regarding medicine use. Specific medicines may be either safe or unsafe to take during pregnancy.  Take a prenatal vitamin that contains at least 600 micrograms (mcg) of folic acid.  If you develop constipation, try taking a stool softener if your health care provider approves. Eating and drinking  Eat a balanced diet that includes fresh fruits and vegetables, whole grains, good sources of protein such as meat, eggs, or tofu, and low-fat dairy. Your health care provider will help you determine the amount of weight gain that is right for you.  Avoid raw meat and uncooked cheese. These carry germs that can cause birth defects in the baby.  Eating four or five small meals rather than three large meals a day may help relieve nausea and vomiting. If you start to feel nauseous, eating a few soda crackers can be helpful. Drinking liquids between meals, instead of during meals, also seems to help ease nausea and vomiting.  Limit foods that are high in fat and processed sugars, such as fried and sweet foods.  To prevent constipation: ? Eat foods that are high in fiber, such as fresh fruits and vegetables, whole grains, and beans. ? Drink enough fluid to keep your urine clear or pale yellow. Activity  Exercise only as directed by your health care provider. Most women can continue their usual exercise routine during  pregnancy. Try to exercise for 30 minutes at least 5 days a week. Exercising will help you: ? Control your weight. ? Stay in shape. ? Be prepared for labor and delivery.  Experiencing pain or cramping in the lower abdomen or lower back is a good sign that you should stop exercising. Check with your health care provider before continuing with normal exercises.  Try to avoid standing for long periods of time. Move your legs often if you must stand in one place for a long time.  Avoid heavy lifting.  Wear low-heeled shoes and practice good posture.  You may continue to have sex unless your health care provider tells you not to. Relieving pain and discomfort  Wear a good support bra to relieve breast tenderness.  Take warm sitz baths to soothe any pain or discomfort caused by hemorrhoids. Use hemorrhoid cream if your health care provider approves.  Rest with your legs elevated if you have leg cramps or low back pain.  If you   develop varicose veins in your legs, wear support hose. Elevate your feet for 15 minutes, 3-4 times a day. Limit salt in your diet. Prenatal care  Schedule your prenatal visits by the twelfth week of pregnancy. They are usually scheduled monthly at first, then more often in the last 2 months before delivery.  Write down your questions. Take them to your prenatal visits.  Keep all your prenatal visits as told by your health care provider. This is important. Safety  Wear your seat belt at all times when driving.  Make a list of emergency phone numbers, including numbers for family, friends, the hospital, and police and fire departments. General instructions  Ask your health care provider for a referral to a local prenatal education class. Begin classes no later than the beginning of month 6 of your pregnancy.  Ask for help if you have counseling or nutritional needs during pregnancy. Your health care provider can offer advice or refer you to specialists for help  with various needs.  Do not use hot tubs, steam rooms, or saunas.  Do not douche or use tampons or scented sanitary pads.  Do not cross your legs for long periods of time.  Avoid cat litter boxes and soil used by cats. These carry germs that can cause birth defects in the baby and possibly loss of the fetus by miscarriage or stillbirth.  Avoid all smoking, herbs, alcohol, and medicines not prescribed by your health care provider. Chemicals in these products affect the formation and growth of the baby.  Do not use any products that contain nicotine or tobacco, such as cigarettes and e-cigarettes. If you need help quitting, ask your health care provider. You may receive counseling support and other resources to help you quit.  Schedule a dentist appointment. At home, brush your teeth with a soft toothbrush and be gentle when you floss. Contact a health care provider if:  You have dizziness.  You have mild pelvic cramps, pelvic pressure, or nagging pain in the abdominal area.  You have persistent nausea, vomiting, or diarrhea.  You have a bad smelling vaginal discharge.  You have pain when you urinate.  You notice increased swelling in your face, hands, legs, or ankles.  You are exposed to fifth disease or chickenpox.  You are exposed to German measles (rubella) and have never had it. Get help right away if:  You have a fever.  You are leaking fluid from your vagina.  You have spotting or bleeding from your vagina.  You have severe abdominal cramping or pain.  You have rapid weight gain or loss.  You vomit blood or material that looks like coffee grounds.  You develop a severe headache.  You have shortness of breath.  You have any kind of trauma, such as from a fall or a car accident. Summary  The first trimester of pregnancy is from week 1 until the end of week 13 (months 1 through 3).  Your body goes through many changes during pregnancy. The changes vary from  woman to woman.  You will have routine prenatal visits. During those visits, your health care provider will examine you, discuss any test results you may have, and talk with you about how you are feeling. This information is not intended to replace advice given to you by your health care provider. Make sure you discuss any questions you have with your health care provider. Document Released: 02/04/2001 Document Revised: 01/23/2016 Document Reviewed: 01/23/2016 Elsevier Interactive Patient Education  2017   [employer]  Contraception Choices Contraception (birth control) is the use of any methods or devices to prevent pregnancy. Below are some methods to help avoid pregnancy. Hormonal methods  Contraceptive implant. This is a thin, plastic tube containing progesterone hormone. It does not contain estrogen hormone. Your health care provider inserts the tube in the inner part of the upper arm. The tube can remain in place for up to 3 years. After 3 years, the implant must be removed. The implant prevents the ovaries from releasing an egg (ovulation), thickens the cervical mucus to prevent sperm from entering the uterus, and thins the lining of the inside of the uterus.  Progesterone-only injections. These injections are given every 3 months by your health care provider to prevent pregnancy. This synthetic progesterone hormone stops the ovaries from releasing eggs. It also thickens cervical mucus and changes the uterine lining. This makes it harder for sperm to survive in the uterus.  Birth control pills. These pills contain estrogen and progesterone hormone. They work by preventing the ovaries from releasing eggs (ovulation). They also cause the cervical mucus to thicken, preventing the sperm from entering the uterus. Birth control pills are prescribed by a health care provider.Birth control pills can also be used to treat heavy periods.  Minipill. This type of birth control pill contains only the  progesterone hormone. They are taken every day of each month and must be prescribed by your health care provider.  Birth control patch. The patch contains hormones similar to those in birth control pills. It must be changed once a week and is prescribed by a health care provider.  Vaginal ring. The ring contains hormones similar to those in birth control pills. It is left in the vagina for 3 weeks, removed for 1 week, and then a new one is put back in place. The patient must be comfortable inserting and removing the ring from the vagina.A health care provider's prescription is necessary.  Emergency contraception. Emergency contraceptives prevent pregnancy after unprotected sexual intercourse. This pill can be taken right after sex or up to 5 days after unprotected sex. It is most effective the sooner you take the pills after having sexual intercourse. Most emergency contraceptive pills are available without a prescription. Check with your pharmacist. Do not use emergency contraception as your only form of birth control. Barrier methods  Female condom. This is a thin sheath (latex or rubber) that is worn over the penis during sexual intercourse. It can be used with spermicide to increase effectiveness.  Female condom. This is a soft, loose-fitting sheath that is put into the vagina before sexual intercourse.  Diaphragm. This is a soft, latex, dome-shaped barrier that must be fitted by a health care provider. It is inserted into the vagina, along with a spermicidal jelly. It is inserted before intercourse. The diaphragm should be left in the vagina for 6 to 8 hours after intercourse.  Cervical cap. This is a round, soft, latex or plastic cup that fits over the cervix and must be fitted by a health care provider. The cap can be left in place for up to 48 hours after intercourse.  Sponge. This is a soft, circular piece of polyurethane foam. The sponge has spermicide in it. It is inserted into the vagina  after wetting it and before sexual intercourse.  Spermicides. These are chemicals that kill or block sperm from entering the cervix and uterus. They come in the form of creams, jellies, suppositories, foam, or tablets. They do not   require a prescription. They are inserted into the vagina with an applicator before having sexual intercourse. The process must be repeated every time you have sexual intercourse. Intrauterine contraception  Intrauterine device (IUD). This is a T-shaped device that is put in a woman's uterus during a menstrual period to prevent pregnancy. There are 2 types: ? Copper IUD. This type of IUD is wrapped in copper wire and is placed inside the uterus. Copper makes the uterus and fallopian tubes produce a fluid that kills sperm. It can stay in place for 10 years. ? Hormone IUD. This type of IUD contains the hormone progestin (synthetic progesterone). The hormone thickens the cervical mucus and prevents sperm from entering the uterus, and it also thins the uterine lining to prevent implantation of a fertilized egg. The hormone can weaken or kill the sperm that get into the uterus. It can stay in place for 3-5 years, depending on which type of IUD is used. Permanent methods of contraception  Female tubal ligation. This is when the woman's fallopian tubes are surgically sealed, tied, or blocked to prevent the egg from traveling to the uterus.  Hysteroscopic sterilization. This involves placing a small coil or insert into each fallopian tube. Your doctor uses a technique called hysteroscopy to do the procedure. The device causes scar tissue to form. This results in permanent blockage of the fallopian tubes, so the sperm cannot fertilize the egg. It takes about 3 months after the procedure for the tubes to become blocked. You must use another form of birth control for these 3 months.  Female sterilization. This is when the female has the tubes that carry sperm tied off (vasectomy).This  blocks sperm from entering the vagina during sexual intercourse. After the procedure, the man can still ejaculate fluid (semen). Natural planning methods  Natural family planning. This is not having sexual intercourse or using a barrier method (condom, diaphragm, cervical cap) on days the woman could become pregnant.  Calendar method. This is keeping track of the length of each menstrual cycle and identifying when you are fertile.  Ovulation method. This is avoiding sexual intercourse during ovulation.  Symptothermal method. This is avoiding sexual intercourse during ovulation, using a thermometer and ovulation symptoms.  Post-ovulation method. This is timing sexual intercourse after you have ovulated. Regardless of which type or method of contraception you choose, it is important that you use condoms to protect against the transmission of sexually transmitted infections (STIs). Talk with your health care provider about which form of contraception is most appropriate for you. This information is not intended to replace advice given to you by your health care provider. Make sure you discuss any questions you have with your health care provider. Document Released: 02/10/2005 Document Revised: 07/19/2015 Document Reviewed: 08/05/2012 Elsevier Interactive Patient Education  2017 [employer]   Breastfeeding Deciding to breastfeed is one of the best choices you can make for you and your baby. A change in hormones during pregnancy causes your breast tissue to grow and increases the number and size of your milk ducts. These hormones also allow proteins, sugars, and fats from your blood supply to make breast milk in your milk-producing glands. Hormones prevent breast milk from being released before your baby is born as well as prompt milk flow after birth. Once breastfeeding has begun, thoughts of your baby, as well as his or her sucking or crying, can stimulate the release of milk from your  milk-producing glands. Benefits of breastfeeding For Your Baby  Your   first milk (colostrum) helps your baby's digestive system function better.  There are antibodies in your milk that help your baby fight off infections.  Your baby has a lower incidence of asthma, allergies, and sudden infant death syndrome.  The nutrients in breast milk are better for your baby than infant formulas and are designed uniquely for your baby's needs.  Breast milk improves your baby's brain development.  Your baby is less likely to develop other conditions, such as childhood obesity, asthma, or type 2 diabetes mellitus.  For You  Breastfeeding helps to create a very special bond between you and your baby.  Breastfeeding is convenient. Breast milk is always available at the correct temperature and costs nothing.  Breastfeeding helps to burn calories and helps you lose the weight gained during pregnancy.  Breastfeeding makes your uterus contract to its prepregnancy size faster and slows bleeding (lochia) after you give birth.  Breastfeeding helps to lower your risk of developing type 2 diabetes mellitus, osteoporosis, and breast or ovarian cancer later in life.  Signs that your baby is hungry Early Signs of Hunger  Increased alertness or activity.  Stretching.  Movement of the head from side to side.  Movement of the head and opening of the mouth when the corner of the mouth or cheek is stroked (rooting).  Increased sucking sounds, smacking lips, cooing, sighing, or squeaking.  Hand-to-mouth movements.  Increased sucking of fingers or hands.  Late Signs of Hunger  Fussing.  Intermittent crying.  Extreme Signs of Hunger Signs of extreme hunger will require calming and consoling before your baby will be able to breastfeed successfully. Do not wait for the following signs of extreme hunger to occur before you initiate breastfeeding:  Restlessness.  A loud, strong  cry.  Screaming.  Breastfeeding basics Breastfeeding Initiation  Find a comfortable place to sit or lie down, with your neck and back well supported.  Place a pillow or rolled up blanket under your baby to bring him or her to the level of your breast (if you are seated). Nursing pillows are specially designed to help support your arms and your baby while you breastfeed.  Make sure that your baby's abdomen is facing your abdomen.  Gently massage your breast. With your fingertips, massage from your chest wall toward your nipple in a circular motion. This encourages milk flow. You may need to continue this action during the feeding if your milk flows slowly.  Support your breast with 4 fingers underneath and your thumb above your nipple. Make sure your fingers are well away from your nipple and your baby's mouth.  Stroke your baby's lips gently with your finger or nipple.  When your baby's mouth is open wide enough, quickly bring your baby to your breast, placing your entire nipple and as much of the colored area around your nipple (areola) as possible into your baby's mouth. ? More areola should be visible above your baby's upper lip than below the lower lip. ? Your baby's tongue should be between his or her lower gum and your breast.  Ensure that your baby's mouth is correctly positioned around your nipple (latched). Your baby's lips should create a seal on your breast and be turned out (everted).  It is common for your baby to suck about 2-3 minutes in order to start the flow of breast milk.  Latching Teaching your baby how to latch on to your breast properly is very important. An improper latch can cause nipple pain and decreased   milk supply for you and poor weight gain in your baby. Also, if your baby is not latched onto your nipple properly, he or she may swallow some air during feeding. This can make your baby fussy. Burping your baby when you switch breasts during the feeding can  help to get rid of the air. However, teaching your baby to latch on properly is still the best way to prevent fussiness from swallowing air while breastfeeding. Signs that your baby has successfully latched on to your nipple:  Silent tugging or silent sucking, without causing you pain.  Swallowing heard between every 3-4 sucks.  Muscle movement above and in front of his or her ears while sucking.  Signs that your baby has not successfully latched on to nipple:  Sucking sounds or smacking sounds from your baby while breastfeeding.  Nipple pain.  If you think your baby has not latched on correctly, slip your finger into the corner of your baby's mouth to break the suction and place it between your baby's gums. Attempt breastfeeding initiation again. Signs of Successful Breastfeeding Signs from your baby:  A gradual decrease in the number of sucks or complete cessation of sucking.  Falling asleep.  Relaxation of his or her body.  Retention of a small amount of milk in his or her mouth.  Letting go of your breast by himself or herself.  Signs from you:  Breasts that have increased in firmness, weight, and size 1-3 hours after feeding.  Breasts that are softer immediately after breastfeeding.  Increased milk volume, as well as a change in milk consistency and color by the fifth day of breastfeeding.  Nipples that are not sore, cracked, or bleeding.  Signs That Your Baby is Getting Enough Milk  Wetting at least 1-2 diapers during the first 24 hours after birth.  Wetting at least 5-6 diapers every 24 hours for the first week after birth. The urine should be clear or pale yellow by 5 days after birth.  Wetting 6-8 diapers every 24 hours as your baby continues to grow and develop.  At least 3 stools in a 24-hour period by age 5 days. The stool should be soft and yellow.  At least 3 stools in a 24-hour period by age 7 days. The stool should be seedy and yellow.  No loss of  weight greater than 10% of birth weight during the first 3 days of age.  Average weight gain of 4-7 ounces (113-198 g) per week after age 4 days.  Consistent daily weight gain by age 5 days, without weight loss after the age of 2 weeks.  After a feeding, your baby may spit up a small amount. This is common. Breastfeeding frequency and duration Frequent feeding will help you make more milk and can prevent sore nipples and breast engorgement. Breastfeed when you feel the need to reduce the fullness of your breasts or when your baby shows signs of hunger. This is called "breastfeeding on demand." Avoid introducing a pacifier to your baby while you are working to establish breastfeeding (the first 4-6 weeks after your baby is born). After this time you may choose to use a pacifier. Research has shown that pacifier use during the first year of a baby's life decreases the risk of sudden infant death syndrome (SIDS). Allow your baby to feed on each breast as long as he or she wants. Breastfeed until your baby is finished feeding. When your baby unlatches or falls asleep while feeding from the first   breast, offer the second breast. Because newborns are often sleepy in the first few weeks of life, you may need to awaken your baby to get him or her to feed. Breastfeeding times will vary from baby to baby. However, the following rules can serve as a guide to help you ensure that your baby is properly fed:  Newborns (babies 4 weeks of age or younger) may breastfeed every 1-3 hours.  Newborns should not go longer than 3 hours during the day or 5 hours during the night without breastfeeding.  You should breastfeed your baby a minimum of 8 times in a 24-hour period until you begin to introduce solid foods to your baby at around 6 months of age.  Breast milk pumping Pumping and storing breast milk allows you to ensure that your baby is exclusively fed your breast milk, even at times when you are unable to  breastfeed. This is especially important if you are going back to work while you are still breastfeeding or when you are not able to be present during feedings. Your lactation consultant can give you guidelines on how long it is safe to store breast milk. A breast pump is a machine that allows you to pump milk from your breast into a sterile bottle. The pumped breast milk can then be stored in a refrigerator or freezer. Some breast pumps are operated by hand, while others use electricity. Ask your lactation consultant which type will work best for you. Breast pumps can be purchased, but some hospitals and breastfeeding support groups lease breast pumps on a monthly basis. A lactation consultant can teach you how to hand express breast milk, if you prefer not to use a pump. Caring for your breasts while you breastfeed Nipples can become dry, cracked, and sore while breastfeeding. The following recommendations can help keep your breasts moisturized and healthy:  Avoid using soap on your nipples.  Wear a supportive bra. Although not required, special nursing bras and tank tops are designed to allow access to your breasts for breastfeeding without taking off your entire bra or top. Avoid wearing underwire-style bras or extremely tight bras.  Air dry your nipples for 3-4minutes after each feeding.  Use only cotton bra pads to absorb leaked breast milk. Leaking of breast milk between feedings is normal.  Use lanolin on your nipples after breastfeeding. Lanolin helps to maintain your skin's normal moisture barrier. If you use pure lanolin, you do not need to wash it off before feeding your baby again. Pure lanolin is not toxic to your baby. You may also hand express a few drops of breast milk and gently massage that milk into your nipples and allow the milk to air dry.  In the first few weeks after giving birth, some women experience extremely full breasts (engorgement). Engorgement can make your breasts  feel heavy, warm, and tender to the touch. Engorgement peaks within 3-5 days after you give birth. The following recommendations can help ease engorgement:  Completely empty your breasts while breastfeeding or pumping. You may want to start by applying warm, moist heat (in the shower or with warm water-soaked hand towels) just before feeding or pumping. This increases circulation and helps the milk flow. If your baby does not completely empty your breasts while breastfeeding, pump any extra milk after he or she is finished.  Wear a snug bra (nursing or regular) or tank top for 1-2 days to signal your body to slightly decrease milk production.  Apply ice packs to   uncomfortable for you.  Make sure that your baby is latched on and positioned properly while breastfeeding. If engorgement persists after 48 hours of following these recommendations, contact your health care provider or a Advertising copywriter. Overall health care recommendations while breastfeeding  Eat healthy foods. Alternate between meals and snacks, eating 3 of each per day. Because what you eat affects your breast milk, some of the foods may make your baby more irritable than usual. Avoid eating these foods if you are sure that they are negatively affecting your baby.  Drink milk, fruit juice, and water to satisfy your thirst (about 10 glasses a day).  Rest often, relax, and continue to take your prenatal vitamins to prevent fatigue, stress, and anemia.  Continue breast self-awareness checks.  Avoid chewing and smoking tobacco. Chemicals from cigarettes that pass into breast milk and exposure to secondhand smoke may harm your baby.  Avoid alcohol and drug use, including marijuana. Some medicines that may be harmful to your baby can pass through breast milk. It is important to ask your health care provider before taking any medicine, including all over-the-counter and prescription medicine as well as vitamin and herbal  supplements. It is possible to become pregnant while breastfeeding. If birth control is desired, ask your health care provider about options that will be safe for your baby. Contact a health care provider if:  You feel like you want to stop breastfeeding or have become frustrated with breastfeeding.  You have painful breasts or nipples.  Your nipples are cracked or bleeding.  Your breasts are red, tender, or warm.  You have a swollen area on either breast.  You have a fever or chills.  You have nausea or vomiting.  You have drainage other than breast milk from your nipples.  Your breasts do not become full before feedings by the fifth day after you give birth.  You feel sad and depressed.  Your baby is too sleepy to eat well.  Your baby is having trouble sleeping.  Your baby is wetting less than 3 diapers in a 24-hour period.  Your baby has less than 3 stools in a 24-hour period.  Your baby's skin or the white part of his or her eyes becomes yellow.  Your baby is not gaining weight by 66 days of age. Get help right away if:  Your baby is overly tired (lethargic) and does not want to wake up and feed.  Your baby develops an unexplained fever. This information is not intended to replace advice given to you by your health care provider. Make sure you discuss any questions you have with your health care provider. Document Released: 02/10/2005 Document Revised: 07/25/2015 Document Reviewed: 08/04/2012 Elsevier Interactive Patient Education  2017 [employer].   Preterm Labor and Birth Information The normal length of a pregnancy is 39-41 weeks. Preterm labor is when labor starts before 37 completed weeks of pregnancy. What are the risk factors for preterm labor? Preterm labor is more likely to occur in women who:  Have certain infections during pregnancy such as a bladder infection, sexually transmitted infection, or infection inside the uterus (chorioamnionitis).  Have  a shorter-than-normal cervix.  Have gone into preterm labor before.  Have had surgery on their cervix.  Are younger than age 13 or older than age 81.  Are African American.  Are pregnant with twins or multiple babies (multiple gestation).  Take street drugs or smoke while pregnant.  Do not gain enough weight while pregnant.  Became pregnant  shortly after having been pregnant. What are the symptoms of preterm labor? Symptoms of preterm labor include:  Cramps similar to those that can happen during a menstrual period. The cramps may happen with diarrhea.  Pain in the abdomen or lower back.  Regular uterine contractions that may feel like tightening of the abdomen.  A feeling of increased pressure in the pelvis.  Increased watery or bloody mucus discharge from the vagina.  Water breaking (ruptured amniotic sac). Why is it important to recognize signs of preterm labor? It is important to recognize signs of preterm labor because babies who are born prematurely may not be fully developed. This can put them at an increased risk for:  Long-term (chronic) heart and lung problems.  Difficulty immediately after birth with regulating body systems, including blood sugar, body temperature, heart rate, and breathing rate.  Bleeding in the brain.  Cerebral palsy.  Learning difficulties.  Death. These risks are highest for babies who are born before 34 weeks of pregnancy. How is preterm labor treated? Treatment depends on the length of your pregnancy, your condition, and the health of your baby. It may involve:  Having a stitch (suture) placed in your cervix to prevent your cervix from opening too early (cerclage).  Taking or being given medicines, such as:  Hormone medicines. These may be given early in pregnancy to help support the pregnancy.  Medicine to stop contractions.  Medicines to help mature the baby's lungs. These may be prescribed if the risk of delivery is  high.  Medicines to prevent your baby from developing cerebral palsy. If the labor happens before 34 weeks of pregnancy, you may need to stay in the hospital. What should I do if I think I am in preterm labor? If you think that you are going into preterm labor, call your health care provider right away. How can I prevent preterm labor in future pregnancies? To increase your chance of having a full-term pregnancy:  Do not use any tobacco products, such as cigarettes, chewing tobacco, and e-cigarettes. If you need help quitting, ask your health care provider.  Do not use street drugs or medicines that have not been prescribed to you during your pregnancy.  Talk with your health care provider before taking any herbal supplements, even if you have been taking them regularly.  Make sure you gain a healthy amount of weight during your pregnancy.  Watch for infection. If you think that you might have an infection, get it checked right away.  Make sure to tell your health care provider if you have gone into preterm labor before. This information is not intended to replace advice given to you by your health care provider. Make sure you discuss any questions you have with your health care provider. Document Released: 05/03/2003 Document Revised: 07/24/2015 Document Reviewed: 07/04/2015 Elsevier Interactive Patient Education  2017 [employer].

## 2016-06-05 NOTE — Progress Notes (Signed)
New ob packet given  First Trimester Screen scheduled for April 20th @ 0800.  Pt notified. Makena faxed

## 2016-06-05 NOTE — Progress Notes (Signed)
Subjective:    Sherri Shepherd is a G2X5284 [redacted]w[redacted]d being seen today for her first obstetrical visit.  Her obstetrical history is significant for previous preterm delivery x 2- first pregnancy delivered at 36 weeks with advanced cervical dilation starting at 33 weeks. Second pregnancy resulted in a loss at 20 weeks secondary to PPROM. Patient also had a positive chlamydia test on 3/7 during her first trimester. Patient does not intend to breast feed. Pregnancy history fully reviewed.  Patient reports some mild nausea which is improving.  Vitals:   06/05/16 0825  BP: 113/79  Pulse: 96  Weight: 123 lb 1.6 oz (55.8 kg)    HISTORY: OB History  Gravida Para Term Preterm AB Living  3 2 0 2 0 1  SAB TAB Ectopic Multiple Live Births  0 0 0 0 1    # Outcome Date GA Lbr Len/2nd Weight Sex Delivery Anes PTL Lv  3 Current           2 Preterm 03/14/15 [redacted]w[redacted]d  10 oz (0.283 kg) M Vag-Spont None  FD  1 Preterm 10/22/11 [redacted]w[redacted]d 11:10 / 03:30 5 lb 12.4 oz (2.62 kg) M Vag-Spont EPI  LIV     Birth Comments: none     Past Medical History:  Diagnosis Date  . Chlamydia Feb 2017  . H/O varicella   . No pertinent past medical history   . Premature dilatation of cervix during pregnancy 10/01/2011   5/90%/-1  . Preterm delivery 12/04/2011   SVD at [redacted]w[redacted]d on 10/22/11  . Preterm labor    Past Surgical History:  Procedure Laterality Date  . NO PAST SURGERIES     Family History  Problem Relation Age of Onset  . Hypertension Mother   . Diabetes Brother   . Cancer Maternal Grandmother     LUNG  . Diabetes Maternal Grandfather   . Arthritis Paternal Grandmother   . Cancer Paternal Grandmother     BREAST  . Diabetes Maternal Uncle      Exam    Uterus:   12-weeks  Pelvic Exam:    Perineum: No Hemorrhoids, Normal Perineum   Vulva: normal   Vagina:  normal mucosa, normal discharge   pH:    Cervix: multiparous appearance and cervix is closed and long   Adnexa: no mass, fullness, tenderness   Bony Pelvis: gynecoid  System: Breast:  normal appearance, no masses or tenderness   Skin: normal coloration and turgor, no rashes    Neurologic: oriented, no focal deficits   Extremities: normal strength, tone, and muscle mass   HEENT extra ocular movement intact   Mouth/Teeth mucous membranes moist, pharynx normal without lesions and dental hygiene good   Neck supple and no masses   Cardiovascular: regular rate and rhythm   Respiratory:  chest clear, no wheezing, crepitations, rhonchi, normal symmetric air entry   Abdomen: soft, non-tender; bowel sounds normal; no masses,  no organomegaly   Urinary:       Assessment:    Pregnancy: [id number] Patient Active Problem List   Diagnosis Date Noted  . Chlamydia infection affecting pregnancy in first trimester 06/05/2016  . Supervision of high risk pregnancy, antepartum 06/04/2016  . History of preterm delivery 03/13/2015        Plan:     Initial labs drawn. Prenatal vitamins. Problem list reviewed and updated. Genetic Screening discussed First Screen: ordered.  Ultrasound discussed; fetal survey: requested. Patient with history of preterm delivery counseled on the benefits of weekly-17-P injections. Patient  desires to receive it and will start at 16 weeks She is interested in Nexplanon for pp contraception Test of cure for chlamydia today  Follow up in 4 weeks. 50% of 30 min visit spent on counseling and coordination of care.     Ranger Petrich 06/05/2016

## 2016-06-06 LAB — GC/CHLAMYDIA PROBE AMP (~~LOC~~) NOT AT ARMC
CHLAMYDIA, DNA PROBE: NEGATIVE
NEISSERIA GONORRHEA: NEGATIVE

## 2016-06-06 LAB — CYTOLOGY - PAP
Adequacy: ABSENT
Diagnosis: NEGATIVE

## 2016-06-07 LAB — CULTURE, OB URINE

## 2016-06-07 LAB — URINE CULTURE, OB REFLEX

## 2016-06-09 LAB — OBSTETRIC PANEL, INCLUDING HIV
ANTIBODY SCREEN: NEGATIVE
BASOS: 1 %
Basophils Absolute: 0.1 10*3/uL (ref 0.0–0.2)
EOS (ABSOLUTE): 0.1 10*3/uL (ref 0.0–0.4)
EOS: 1 %
HEMATOCRIT: 37.9 % (ref 34.0–46.6)
HEMOGLOBIN: 12.4 g/dL (ref 11.1–15.9)
HIV SCREEN 4TH GENERATION: NONREACTIVE
Hepatitis B Surface Ag: NEGATIVE
Immature Grans (Abs): 0 10*3/uL (ref 0.0–0.1)
Immature Granulocytes: 0 %
LYMPHS ABS: 1.5 10*3/uL (ref 0.7–3.1)
Lymphs: 16 %
MCH: 30.8 pg (ref 26.6–33.0)
MCHC: 32.7 g/dL (ref 31.5–35.7)
MCV: 94 fL (ref 79–97)
Monocytes Absolute: 0.6 10*3/uL (ref 0.1–0.9)
Monocytes: 7 %
NEUTROS ABS: 7.1 10*3/uL — AB (ref 1.4–7.0)
Neutrophils: 75 %
Platelets: 216 10*3/uL (ref 150–379)
RBC: 4.02 x10E6/uL (ref 3.77–5.28)
RDW: 14.5 % (ref 12.3–15.4)
RH TYPE: POSITIVE
RPR: NONREACTIVE
Rubella Antibodies, IGG: 2.11 index (ref >0.99)
WBC: 9.4 10*3/uL (ref 3.4–10.8)

## 2016-06-09 LAB — HEMOGLOBINOPATHY EVALUATION
HEMOGLOBIN F QUANTITATION: 0 % (ref 0.0–2.0)
HGB C: 0 %
HGB S: 0 %
HGB VARIANT: 0 %
Hemoglobin A2 Quantitation: 2.8 % (ref 1.8–3.2)
Hgb A: 97.2 % (ref 96.4–98.8)

## 2016-06-12 ENCOUNTER — Telehealth: Payer: Self-pay | Admitting: *Deleted

## 2016-06-12 NOTE — Telephone Encounter (Signed)
Received message from [facility] stating she is calling to schedule shipment of pt's Makena. I returned the call today and was informed that pt does qualify for the Pt assistance program. Medication will be shipped in order to arrive on 5/8.  Pt is scheduled for initial injection on 5/10.

## 2016-06-13 ENCOUNTER — Ambulatory Visit (HOSPITAL_COMMUNITY)
Admission: RE | Admit: 2016-06-13 | Discharge: 2016-06-13 | Disposition: A | Discharge status: Home / Self Care | Payer: 59 | Source: Ambulatory Visit | Attending: Obstetrics and Gynecology | Admitting: Obstetrics and Gynecology

## 2016-06-13 ENCOUNTER — Other Ambulatory Visit: Payer: Self-pay | Admitting: Obstetrics and Gynecology

## 2016-06-13 ENCOUNTER — Encounter (HOSPITAL_COMMUNITY): Payer: Self-pay

## 2016-06-13 DIAGNOSIS — O0990 Supervision of high risk pregnancy, unspecified, unspecified trimester: Secondary | ICD-10-CM

## 2016-06-13 DIAGNOSIS — O09212 Supervision of pregnancy with history of pre-term labor, second trimester: Secondary | ICD-10-CM

## 2016-06-13 DIAGNOSIS — Z8751 Personal history of pre-term labor: Secondary | ICD-10-CM

## 2016-06-13 DIAGNOSIS — Z3A13 13 weeks gestation of pregnancy: Secondary | ICD-10-CM

## 2016-06-13 DIAGNOSIS — Z3682 Encounter for antenatal screening for nuchal translucency: Secondary | ICD-10-CM | POA: Diagnosis not present

## 2016-06-13 DIAGNOSIS — O0992 Supervision of high risk pregnancy, unspecified, second trimester: Secondary | ICD-10-CM | POA: Insufficient documentation

## 2016-06-13 NOTE — Addendum Note (Signed)
Encounter addended by: Lestine Mount, RT on: 06/13/2016  8:57 AM<BR>    Actions taken: Imaging Exam ended

## 2016-06-23 ENCOUNTER — Other Ambulatory Visit: Payer: Self-pay

## 2016-06-25 ENCOUNTER — Inpatient Hospital Stay (HOSPITAL_COMMUNITY)
Admission: AD | Admit: 2016-06-25 | Discharge: 2016-06-25 | Disposition: A | Discharge status: Home / Self Care | Payer: 59 | Source: Ambulatory Visit | Attending: Obstetrics & Gynecology | Admitting: Obstetrics & Gynecology

## 2016-06-25 ENCOUNTER — Encounter (HOSPITAL_COMMUNITY): Payer: Self-pay | Admitting: *Deleted

## 2016-06-25 DIAGNOSIS — R42 Dizziness and giddiness: Secondary | ICD-10-CM | POA: Diagnosis not present

## 2016-06-25 DIAGNOSIS — O26893 Other specified pregnancy related conditions, third trimester: Secondary | ICD-10-CM | POA: Insufficient documentation

## 2016-06-25 DIAGNOSIS — R110 Nausea: Secondary | ICD-10-CM | POA: Insufficient documentation

## 2016-06-25 DIAGNOSIS — R519 Headache, unspecified: Secondary | ICD-10-CM

## 2016-06-25 DIAGNOSIS — R51 Headache: Secondary | ICD-10-CM | POA: Insufficient documentation

## 2016-06-25 DIAGNOSIS — Z3A15 15 weeks gestation of pregnancy: Secondary | ICD-10-CM | POA: Diagnosis not present

## 2016-06-25 DIAGNOSIS — Z3A36 36 weeks gestation of pregnancy: Secondary | ICD-10-CM | POA: Insufficient documentation

## 2016-06-25 DIAGNOSIS — O26892 Other specified pregnancy related conditions, second trimester: Secondary | ICD-10-CM

## 2016-06-25 DIAGNOSIS — O9989 Other specified diseases and conditions complicating pregnancy, childbirth and the puerperium: Secondary | ICD-10-CM

## 2016-06-25 HISTORY — DX: Headache: R51

## 2016-06-25 HISTORY — DX: Headache, unspecified: R51.9

## 2016-06-25 HISTORY — DX: Other specified pregnancy related conditions, second trimester: O26.892

## 2016-06-25 LAB — URINALYSIS, ROUTINE W REFLEX MICROSCOPIC
Bilirubin Urine: NEGATIVE
Glucose, UA: NEGATIVE mg/dL
HGB URINE DIPSTICK: NEGATIVE
Ketones, ur: NEGATIVE mg/dL
NITRITE: NEGATIVE
PROTEIN: NEGATIVE mg/dL
SPECIFIC GRAVITY, URINE: 1.028 (ref 1.005–1.030)
pH: 5 (ref 5.0–8.0)

## 2016-06-25 LAB — CBC WITH DIFFERENTIAL/PLATELET
BASOS ABS: 0.1 10*3/uL (ref 0.0–0.1)
Basophils Relative: 0 %
Eosinophils Absolute: 0.1 10*3/uL (ref 0.0–0.7)
Eosinophils Relative: 1 %
HEMATOCRIT: 35.6 % — AB (ref 36.0–46.0)
HEMOGLOBIN: 12.3 g/dL (ref 12.0–15.0)
LYMPHS ABS: 2.1 10*3/uL (ref 0.7–4.0)
LYMPHS PCT: 18 %
MCH: 31.9 pg (ref 26.0–34.0)
MCHC: 34.6 g/dL (ref 30.0–36.0)
MCV: 92.5 fL (ref 78.0–100.0)
Monocytes Absolute: 0.6 10*3/uL (ref 0.1–1.0)
Monocytes Relative: 5 %
NEUTROS ABS: 9 10*3/uL — AB (ref 1.7–7.7)
Neutrophils Relative %: 76 %
Platelets: 221 10*3/uL (ref 150–400)
RBC: 3.85 MIL/uL — AB (ref 3.87–5.11)
RDW: 14.3 % (ref 11.5–15.5)
WBC: 11.8 10*3/uL — AB (ref 4.0–10.5)

## 2016-06-25 LAB — COMPREHENSIVE METABOLIC PANEL
ALK PHOS: 51 U/L (ref 38–126)
ALT: 17 U/L (ref 14–54)
AST: 20 U/L (ref 15–41)
Albumin: 3.6 g/dL (ref 3.5–5.0)
Anion gap: 5 (ref 5–15)
BILIRUBIN TOTAL: 0.4 mg/dL (ref 0.3–1.2)
BUN: 11 mg/dL (ref 6–20)
CALCIUM: 9.1 mg/dL (ref 8.9–10.3)
CHLORIDE: 104 mmol/L (ref 101–111)
CO2: 24 mmol/L (ref 22–32)
CREATININE: 0.44 mg/dL (ref 0.44–1.00)
Glucose, Bld: 81 mg/dL (ref 65–99)
Potassium: 4.3 mmol/L (ref 3.5–5.1)
Sodium: 133 mmol/L — ABNORMAL LOW (ref 135–145)
TOTAL PROTEIN: 6.9 g/dL (ref 6.5–8.1)

## 2016-06-25 NOTE — MAU Provider Note (Signed)
History     CSN: [id number]  Arrival date and time: 06/25/16 1327   First Provider Initiated Contact with Patient 06/25/16 1413      Chief Complaint  Patient presents with  . Headache  . Dizziness   HPI  Sherri Shepherd is a 26 yo G3P0201 is a 15.[redacted] wks gestation presenting with complaints of light-headedness, dizziness, and nausea while working at [employer].  She reports having H/As off and on since the middle of April.  She thinks she became too hot while at work today, because once she got in the car with [name] going, everything went away.  She reports drinking "plenty of water (1.5 bottles), milk and lots of ice everyday." She has a significant OB history of a loss at 20 wks and preterm vaginal delivery at 36 wks d/t PPROM. She denies any pain, VB, vaginal d/c, LOF, LOC, or vomiting.  She has not taken anything for the H/As, because "they usually go away."  Past Medical History:  Diagnosis Date  . Chlamydia Feb 2017  . H/O varicella   . No pertinent past medical history   . Premature dilatation of cervix during pregnancy 10/01/2011   5/90%/-1  . Preterm delivery 12/04/2011   SVD at [redacted]w[redacted]d on 10/22/11  . Preterm labor     Past Surgical History:  Procedure Laterality Date  . NO PAST SURGERIES      Family History  Problem Relation Age of Onset  . Hypertension Mother   . Diabetes Brother   . Cancer Maternal Grandmother     LUNG  . Diabetes Maternal Grandfather   . Arthritis Paternal Grandmother   . Cancer Paternal Grandmother     BREAST  . Diabetes Maternal Uncle     Social History  Substance Use Topics  . Smoking status: Never Smoker  . Smokeless tobacco: Never Used  . Alcohol use No    Allergies: No Known Allergies  Prescriptions Prior to Admission  Medication Sig Dispense Refill Last Dose  . Prenatal Vit-Fe Fumarate-FA (PRENATAL MULTIVITAMIN) TABS tablet Take 1 tablet by mouth daily at 12 noon. 30 tablet 0 Taking    Review of Systems  Constitutional:  Positive for fatigue (felt weak while at work).  HENT: Negative.   Eyes: Negative.   Respiratory: Negative.   Cardiovascular: Negative.   Gastrointestinal: Positive for nausea (earlier, but none now). Negative for vomiting.  Endocrine: Negative.   Genitourinary: Negative.   Musculoskeletal: Negative.   Skin: Negative.   Allergic/Immunologic: Negative.   Neurological: Positive for dizziness (earlier, not now).  Hematological: Negative.   Psychiatric/Behavioral: Negative.    Patient Vitals for the past 24 hrs:  BP Temp Temp src Pulse Resp SpO2 Weight  06/25/16 1344 111/77 99 F (37.2 C) Oral 78 16 100 % 55.9 kg (123 lb 4 oz)   FHR visualized by informal bedside U/S: 140bpm Results for orders placed or performed during the hospital encounter of 06/25/16 (from the past 24 hour(s))  Urinalysis, Routine w reflex microscopic     Status: Abnormal   Collection Time: 06/25/16  1:47 PM  Result Value Ref Range   Color, Urine YELLOW YELLOW   APPearance HAZY (A) CLEAR   Specific Gravity, Urine 1.028 1.005 - 1.030   pH 5.0 5.0 - 8.0   Glucose, UA NEGATIVE NEGATIVE mg/dL   Hgb urine dipstick NEGATIVE NEGATIVE   Bilirubin Urine NEGATIVE NEGATIVE   Ketones, ur NEGATIVE NEGATIVE mg/dL   Protein, ur NEGATIVE NEGATIVE mg/dL   Nitrite NEGATIVE  NEGATIVE   Leukocytes, UA TRACE (A) NEGATIVE   RBC / HPF 0-5 0 - 5 RBC/hpf   WBC, UA 0-5 0 - 5 WBC/hpf   Bacteria, UA RARE (A) NONE SEEN   Squamous Epithelial / LPF 0-5 (A) NONE SEEN   Mucous PRESENT   CBC with Differential/Platelet     Status: Abnormal   Collection Time: 06/25/16  2:07 PM  Result Value Ref Range   WBC 11.8 (H) 4.0 - 10.5 K/uL   RBC 3.85 (L) 3.87 - 5.11 MIL/uL   Hemoglobin 12.3 12.0 - 15.0 g/dL   HCT 16.1 (L) 09.6 - 04.5 %   MCV 92.5 78.0 - 100.0 fL   MCH 31.9 26.0 - 34.0 pg   MCHC 34.6 30.0 - 36.0 g/dL   RDW 40.9 81.1 - 91.4 %   Platelets 221 150 - 400 K/uL   Neutrophils Relative % 76 %   Neutro Abs 9.0 (H) 1.7 - 7.7 K/uL    Lymphocytes Relative 18 %   Lymphs Abs 2.1 0.7 - 4.0 K/uL   Monocytes Relative 5 %   Monocytes Absolute 0.6 0.1 - 1.0 K/uL   Eosinophils Relative 1 %   Eosinophils Absolute 0.1 0.0 - 0.7 K/uL   Basophils Relative 0 %   Basophils Absolute 0.1 0.0 - 0.1 K/uL  Comprehensive metabolic panel     Status: Abnormal   Collection Time: 06/25/16  2:07 PM  Result Value Ref Range   Sodium 133 (L) 135 - 145 mmol/L   Potassium 4.3 3.5 - 5.1 mmol/L   Chloride 104 101 - 111 mmol/L   CO2 24 22 - 32 mmol/L   Glucose, Bld 81 65 - 99 mg/dL   BUN 11 6 - 20 mg/dL   Creatinine, Ser 7.82 0.44 - 1.00 mg/dL   Calcium 9.1 8.9 - 95.6 mg/dL   Total Protein 6.9 6.5 - 8.1 g/dL   Albumin 3.6 3.5 - 5.0 g/dL   AST 20 15 - 41 U/L   ALT 17 14 - 54 U/L   Alkaline Phosphatase 51 38 - 126 U/L   Total Bilirubin 0.4 0.3 - 1.2 mg/dL   GFR calc non Af Amer >60 >60 mL/min   GFR calc Af Amer >60 >60 mL/min   Anion gap 5 5 - 15   Physical Exam   Blood pressure 111/77, pulse 78, temperature 99 F (37.2 C), temperature source Oral, resp. rate 16, weight 55.9 kg (123 lb 4 oz), last menstrual period 03/24/2016, SpO2 100 %.  Physical Exam  Constitutional: She is oriented to person, place, and time. She appears well-developed and well-nourished.  HENT:  Head: Normocephalic and atraumatic.  Eyes: Pupils are equal, round, and reactive to light.  Neck: Normal range of motion.  Cardiovascular: Normal rate, regular rhythm and normal heart sounds.   Respiratory: Effort normal and breath sounds normal.  GI: Soft. Bowel sounds are normal.  Genitourinary:  Genitourinary Comments: Gravid, S=D, pelvic deferred  Musculoskeletal: Normal range of motion.  Neurological: She is alert and oriented to person, place, and time. She has normal reflexes.  Skin: Skin is warm and dry.  Psychiatric: She has a normal mood and affect. Her behavior is normal. Judgment and thought content normal.    MAU Course   Procedures  MDM CCUA CBC CMP Informal Bedside U/S - (+) FHT visualized at 140bpm  Assessment and Plan  26 yo G3P0201 at 15.[redacted] wks gestation Headache in Pregnancy, second trimester  Discharge Home  Educated on staying well-hydrated  to prevent near syncopal episodes Educated on eating small meals/snacks every 2-3 hrs Note for work to have frequent breaks Keep scheduled appointment at [facility]  Patient verbalized an understanding of the plan of care and agrees.   Raelyn Mora MSN, CNM 06/25/2016, 2:20 PM

## 2016-06-25 NOTE — MAU Note (Signed)
Been having headaches, back to back lately.  Today when she was working, got hot sat down, was drinking ice, when she stood up she got light headed and vision went dark.

## 2016-07-03 ENCOUNTER — Encounter: Payer: Self-pay | Admitting: Obstetrics & Gynecology

## 2016-07-03 ENCOUNTER — Ambulatory Visit (INDEPENDENT_AMBULATORY_CARE_PROVIDER_SITE_OTHER): Payer: 59 | Admitting: Obstetrics & Gynecology

## 2016-07-03 VITALS — BP 107/66 | HR 87 | Wt 125.4 lb

## 2016-07-03 DIAGNOSIS — O09212 Supervision of pregnancy with history of pre-term labor, second trimester: Secondary | ICD-10-CM | POA: Diagnosis not present

## 2016-07-03 DIAGNOSIS — O26892 Other specified pregnancy related conditions, second trimester: Secondary | ICD-10-CM

## 2016-07-03 DIAGNOSIS — Z8751 Personal history of pre-term labor: Secondary | ICD-10-CM

## 2016-07-03 DIAGNOSIS — O0992 Supervision of high risk pregnancy, unspecified, second trimester: Secondary | ICD-10-CM

## 2016-07-03 DIAGNOSIS — O0990 Supervision of high risk pregnancy, unspecified, unspecified trimester: Secondary | ICD-10-CM

## 2016-07-03 DIAGNOSIS — R51 Headache: Secondary | ICD-10-CM

## 2016-07-03 DIAGNOSIS — O9989 Other specified diseases and conditions complicating pregnancy, childbirth and the puerperium: Secondary | ICD-10-CM

## 2016-07-03 MED ORDER — ACETAMINOPHEN 325 MG PO TABS: 975.0000 mg | ORAL_TABLET | Freq: Four times a day (QID) | ORAL | 4 refills | Status: DC | PRN

## 2016-07-03 MED ORDER — HYDROXYPROGESTERONE CAPROATE 275 MG/1.1ML ~~LOC~~ SOAJ
275.0000 mg | Freq: Once | SUBCUTANEOUS | Status: AC
  Administered 2016-07-03: 275 mg via SUBCUTANEOUS

## 2016-07-03 NOTE — Progress Notes (Signed)
Pt reports having constant headaches;Tylenol is effective

## 2016-07-03 NOTE — Patient Instructions (Signed)
Return to clinic for any scheduled appointments or obstetric concerns, or go to MAU for evaluation  

## 2016-07-03 NOTE — Progress Notes (Addendum)
Educated pt on Benefits of Breastfeeding for Baby/Mom

## 2016-07-03 NOTE — Progress Notes (Signed)
   PRENATAL VISIT NOTE  Subjective:  Sherri Shepherd is a 26 y.o. G3P0201 at 6669w4d being seen today for ongoing prenatal care.  She is currently monitored for the following issues for this high-risk pregnancy and has History of preterm delivery; Supervision of high risk pregnancy, antepartum; Chlamydia infection affecting pregnancy in first trimester; and Headache in pregnancy, antepartum, second trimester on her problem list.  Patient reports frequent headaches alleviated by Tylenol.  Contractions: Not present. Vag. Bleeding: None.   . Denies leaking of fluid.   The following portions of the patient's history were reviewed and updated as appropriate: allergies, current medications, past family history, past medical history, past social history, past surgical history and problem list. Problem list updated.  Objective:   Vitals:   07/03/16 1357  BP: 107/66  Pulse: 87  Weight: 125 lb 6.4 oz (56.9 kg)    Fetal Status: Fetal Heart Rate (bpm): 147         General:  Alert, oriented and cooperative. Patient is in no acute distress.  Skin: Skin is warm and dry. No rash noted.   Cardiovascular: Normal heart rate noted  Respiratory: Normal respiratory effort, no problems with respiration noted  Abdomen: Soft, gravid, appropriate for gestational age. Pain/Pressure: Absent     Pelvic:  Cervical exam deferred        Extremities: Normal range of motion.  Edema: None  Mental Status: Normal mood and affect. Normal behavior. Normal judgment and thought content.   Assessment and Plan:  Pregnancy: G3P0201 at 6269w4d  1. Headache in pregnancy, antepartum, second trimester Advised to continue to take Tylenol prn, adequate hydration recommended.  If worsens, will consider Fioricet. - acetaminophen (TYLENOL) 325 MG tablet; Take 3 tablets (975 mg total) by mouth every 6 (six) hours as needed for headache.  Dispense: 90 tablet; Refill: 4  2. History of preterm delivery Continue weekly 17P  3.  Supervision of high risk pregnancy, antepartum Normal first screen, AFP only today. Anatomy scan scheduled. - AFP, Serum, Open Spina Bifida No other complaints or concerns.  Routine obstetric precautions reviewed. Please refer to After Visit Summary for other counseling recommendations.  Return for Weekly 17P visits, then 17P and OB visit in 4 weeks.   Tereso NewcomerAnyanwu, Raydon Chappuis A, MD

## 2016-07-08 LAB — AFP, SERUM, OPEN SPINA BIFIDA
AFP MOM: 1.1
AFP Value: 45 ng/mL
Gest. Age on Collection Date: 16 weeks
Maternal Age At EDD: 24.7 yr
OSBR RISK 1 IN: 10000
TEST RESULTS AFP: NEGATIVE
Weight: 125 [lb_av]

## 2016-07-11 ENCOUNTER — Encounter (HOSPITAL_COMMUNITY): Payer: Self-pay

## 2016-07-11 ENCOUNTER — Ambulatory Visit (INDEPENDENT_AMBULATORY_CARE_PROVIDER_SITE_OTHER): Payer: 59 | Admitting: *Deleted

## 2016-07-11 ENCOUNTER — Ambulatory Visit (HOSPITAL_COMMUNITY)
Admission: RE | Admit: 2016-07-11 | Discharge: 2016-07-11 | Disposition: A | Discharge status: Home / Self Care | Payer: 59 | Source: Ambulatory Visit | Attending: Obstetrics and Gynecology | Admitting: Obstetrics and Gynecology

## 2016-07-11 VITALS — BP 98/69 | HR 87 | Ht 65.0 in | Wt 126.4 lb

## 2016-07-11 DIAGNOSIS — Z3A17 17 weeks gestation of pregnancy: Secondary | ICD-10-CM | POA: Insufficient documentation

## 2016-07-11 DIAGNOSIS — Z363 Encounter for antenatal screening for malformations: Secondary | ICD-10-CM | POA: Diagnosis not present

## 2016-07-11 DIAGNOSIS — Z8751 Personal history of pre-term labor: Secondary | ICD-10-CM | POA: Diagnosis present

## 2016-07-11 DIAGNOSIS — O09212 Supervision of pregnancy with history of pre-term labor, second trimester: Secondary | ICD-10-CM | POA: Diagnosis not present

## 2016-07-11 MED ORDER — HYDROXYPROGESTERONE CAPROATE 275 MG/1.1ML ~~LOC~~ SOAJ
275.0000 mg | Freq: Once | SUBCUTANEOUS | Status: AC
  Administered 2016-07-11: 275 mg via SUBCUTANEOUS

## 2016-07-17 ENCOUNTER — Ambulatory Visit: Payer: 59 | Admitting: *Deleted

## 2016-07-17 VITALS — BP 107/71 | HR 81 | Wt 130.0 lb

## 2016-07-17 DIAGNOSIS — O09892 Supervision of other high risk pregnancies, second trimester: Secondary | ICD-10-CM

## 2016-07-17 DIAGNOSIS — O09212 Supervision of pregnancy with history of pre-term labor, second trimester: Principal | ICD-10-CM

## 2016-07-17 MED ORDER — HYDROXYPROGESTERONE CAPROATE 275 MG/1.1ML ~~LOC~~ SOAJ
275.0000 mg | SUBCUTANEOUS | Status: DC
  Administered 2016-07-17 – 2016-07-24 (×2): 275 mg via SUBCUTANEOUS

## 2016-07-22 ENCOUNTER — Other Ambulatory Visit (HOSPITAL_COMMUNITY): Payer: Self-pay | Admitting: *Deleted

## 2016-07-22 DIAGNOSIS — O09219 Supervision of pregnancy with history of pre-term labor, unspecified trimester: Principal | ICD-10-CM

## 2016-07-22 DIAGNOSIS — O09899 Supervision of other high risk pregnancies, unspecified trimester: Secondary | ICD-10-CM

## 2016-07-24 ENCOUNTER — Ambulatory Visit (INDEPENDENT_AMBULATORY_CARE_PROVIDER_SITE_OTHER): Payer: 59 | Admitting: *Deleted

## 2016-07-24 VITALS — BP 110/62 | HR 91

## 2016-07-24 DIAGNOSIS — O09212 Supervision of pregnancy with history of pre-term labor, second trimester: Secondary | ICD-10-CM | POA: Diagnosis not present

## 2016-07-24 DIAGNOSIS — O0990 Supervision of high risk pregnancy, unspecified, unspecified trimester: Secondary | ICD-10-CM

## 2016-07-24 DIAGNOSIS — Z3200 Encounter for pregnancy test, result unknown: Secondary | ICD-10-CM

## 2016-07-25 ENCOUNTER — Encounter (HOSPITAL_COMMUNITY): Payer: Self-pay

## 2016-07-25 ENCOUNTER — Inpatient Hospital Stay (EMERGENCY_DEPARTMENT_HOSPITAL)
Admission: AD | Admit: 2016-07-25 | Discharge: 2016-07-25 | Disposition: A | Discharge status: Home / Self Care | Payer: 59 | Source: Ambulatory Visit | Attending: Obstetrics and Gynecology | Admitting: Obstetrics and Gynecology

## 2016-07-25 ENCOUNTER — Encounter (HOSPITAL_COMMUNITY): Payer: Self-pay | Admitting: *Deleted

## 2016-07-25 ENCOUNTER — Ambulatory Visit (HOSPITAL_COMMUNITY)
Admission: RE | Admit: 2016-07-25 | Discharge: 2016-07-25 | Disposition: A | Discharge status: Home / Self Care | Payer: 59 | Source: Ambulatory Visit | Attending: Obstetrics and Gynecology | Admitting: Obstetrics and Gynecology

## 2016-07-25 DIAGNOSIS — T888XXA Other specified complications of surgical and medical care, not elsewhere classified, initial encounter: Secondary | ICD-10-CM

## 2016-07-25 DIAGNOSIS — O9989 Other specified diseases and conditions complicating pregnancy, childbirth and the puerperium: Secondary | ICD-10-CM

## 2016-07-25 DIAGNOSIS — O09219 Supervision of pregnancy with history of pre-term labor, unspecified trimester: Secondary | ICD-10-CM | POA: Diagnosis present

## 2016-07-25 DIAGNOSIS — Y848 Other medical procedures as the cause of abnormal reaction of the patient, or of later complication, without mention of misadventure at the time of the procedure: Secondary | ICD-10-CM

## 2016-07-25 DIAGNOSIS — T8090XA Unspecified complication following infusion and therapeutic injection, initial encounter: Secondary | ICD-10-CM | POA: Diagnosis not present

## 2016-07-25 DIAGNOSIS — Z79899 Other long term (current) drug therapy: Secondary | ICD-10-CM

## 2016-07-25 DIAGNOSIS — Z3A19 19 weeks gestation of pregnancy: Secondary | ICD-10-CM | POA: Insufficient documentation

## 2016-07-25 DIAGNOSIS — O09899 Supervision of other high risk pregnancies, unspecified trimester: Secondary | ICD-10-CM

## 2016-07-25 DIAGNOSIS — Z331 Pregnant state, incidental: Secondary | ICD-10-CM

## 2016-07-25 MED ORDER — IBUPROFEN 600 MG PO TABS
600.0000 mg | ORAL_TABLET | Freq: Once | ORAL | Status: AC
  Administered 2016-07-25: 600 mg via ORAL
  Filled 2016-07-25: qty 1

## 2016-07-25 MED ORDER — DIPHENHYDRAMINE HCL 25 MG PO CAPS
25.0000 mg | ORAL_CAPSULE | Freq: Once | ORAL | Status: AC
  Administered 2016-07-25: 25 mg via ORAL
  Filled 2016-07-25: qty 1

## 2016-07-25 MED ORDER — DIPHENHYDRAMINE HCL 25 MG PO CAPS: 25.0000 mg | ORAL_CAPSULE | Freq: Four times a day (QID) | ORAL | 0 refills | Status: DC | PRN

## 2016-07-25 MED ORDER — IBUPROFEN 600 MG PO TABS: 600.0000 mg | ORAL_TABLET | Freq: Four times a day (QID) | ORAL | 0 refills | Status: DC | PRN

## 2016-07-25 NOTE — MAU Provider Note (Signed)
Chief Complaint: Medication Reaction   First Provider Initiated Contact with Patient 07/25/16 2214     SUBJECTIVE HPI: Sherri Shepherd is a 26 y.o. G3P0201 at [redacted]w[redacted]d who presents to Maternity Admissions reporting possible reaction to 17-P injection she received yesterday. She reports having a tender, red lump on her left outer, upper arm. This is her fourth injection. She has had mild tenderness and swelling after previous injections, but no redness and not as much tenderness.  Location: Left upper outer arm Quality: Tender Severity: Moderate/10 on pain scale Duration: Less than 1 day Context: Post-injection Timing: Constant Modifying factors: None. Hasn't tried anything for symptoms. Associated signs and symptoms: Positive for redness and mild itching. Negative for fever, chills, rash, hives, drainage, difficulty breathing, swelling of lips or tongue.  Past Medical History:  Diagnosis Date  . Chlamydia Feb 2017  . H/O varicella   . Headache in pregnancy, antepartum, second trimester 06/25/2016  . Premature dilatation of cervix during pregnancy 10/01/2011   5/90%/-1  . Preterm delivery 12/04/2011   SVD at [redacted]w[redacted]d on 10/22/11  . Preterm labor    OB History  Gravida Para Term Preterm AB Living  3 2 0 2 0 1  SAB TAB Ectopic Multiple Live Births  0 0 0 0 1    # Outcome Date GA Lbr Len/2nd Weight Sex Delivery Anes PTL Lv  3 Current           2 Preterm 03/14/15 [redacted]w[redacted]d  10 oz (0.283 kg) M Vag-Spont None  FD  1 Preterm 10/22/11 [redacted]w[redacted]d 11:10 / 03:30 5 lb 12.4 oz (2.62 kg) M Vag-Spont EPI  LIV     Birth Comments: none     Past Surgical History:  Procedure Laterality Date  . NO PAST SURGERIES     Social History   Social History  . Marital status: Single    Spouse name: N/A  . Number of children: N/A  . Years of education: 48   Occupational History  . HOMEMAKER    Social History Main Topics  . Smoking status: Never Smoker  . Smokeless tobacco: Never Used  . Alcohol use No  .  Drug use: No  . Sexual activity: Not Currently    Partners: Male    Birth control/ protection: None   Other Topics Concern  . Not on file   Social History Narrative  . No narrative on file   Family History  Problem Relation Age of Onset  . Hypertension Mother   . Diabetes Brother   . Cancer Maternal Grandmother        LUNG  . Diabetes Maternal Grandfather   . Arthritis Paternal Grandmother   . Cancer Paternal Grandmother        BREAST  . Diabetes Maternal Uncle    No current facility-administered medications on file prior to encounter.    Current Outpatient Prescriptions on File Prior to Encounter  Medication Sig Dispense Refill  . acetaminophen (TYLENOL) 325 MG tablet Take 3 tablets (975 mg total) by mouth every 6 (six) hours as needed for headache. 90 tablet 4  . Prenatal Vit-Fe Fumarate-FA (PRENATAL MULTIVITAMIN) TABS tablet Take 1 tablet by mouth daily at 12 noon. 30 tablet 0   No Known Allergies  I have reviewed patient's Past Medical Hx, Surgical Hx, Family Hx, Social Hx, medications and allergies.   Review of Systems  Constitutional: Negative for chills and fever.  HENT: Negative for facial swelling.   Respiratory: Negative for shortness of breath and  wheezing.   Gastrointestinal: Negative for abdominal pain.  Skin: Negative for rash.       Positive for tenderness, swelling and redness at injection site    OBJECTIVE Patient Vitals for the past 24 hrs:  BP Temp Pulse Resp SpO2 Height Weight  07/25/16 2208 110/65 98.3 F (36.8 C) 81 16 100 % 5\' 5"  (1.651 m) 130 lb (59 kg)   Constitutional: Well-developed, well-nourished female in no acute distress.  Head: No swelling of lips or tongue. Cardiovascular: normal rate Respiratory: normal rate and effort.  GI: Gravid appropriate for gestational age.  Extremities: 5 cm slightly raised warm, non-fluctuant, tender mass on upper, outer left arm.   Neurologic: Alert and oriented x 4.   Fetal heart rate 140 by  Doppler  LAB RESULTS No results found for this or any previous visit (from the past 24 hour(s)).  IMAGING N/A  MAU COURSE Orders Placed This Encounter  Procedures  . Apply heat to affected area  . Discharge patient   Meds ordered this encounter  Medications  . ibuprofen (ADVIL,MOTRIN) tablet 600 mg  . diphenhydrAMINE (BENADRYL) capsule 25 mg  . ibuprofen (ADVIL,MOTRIN) 600 MG tablet    Sig: Take 1 tablet (600 mg total) by mouth every 6 (six) hours as needed. Do not take after 28 weeks.    Dispense:  30 tablet    Refill:  0    Order Specific Question:   Supervising Provider    Answer:   ERVIN, MICHAEL L [[id number]]  . diphenhydrAMINE (BENADRYL) 25 mg capsule    Sig: Take 1-2 capsules (25-50 mg total) by mouth every 6 (six) hours as needed.    Dispense:  30 capsule    Refill:  0    Order Specific Question:   Supervising Provider    Answer:   Nettie ElmERVIN, MICHAEL L [[id number]]   Discussed Hx, exam w/ Dr. Alysia PennaErvin. Agrees w/ POC. New orders: None.   MDM - Injection site reaction w/out evidence of allergic reaction. Low suspicion for infection.  ASSESSMENT 1. Injection site reaction, initial encounter   2. Pregnant state, incidental     PLAN Discharge home in stable condition. Infection and allergy precautions If reaction worsen may need to discontinue 17-P.  Follow-up Information    [facility] on 07/[facility]/2016.   Specialty:  Obstetrics and Gynecology Why:  to check injection site Contact information: [address] [place] [id number] [phone]       [facility] Follow up.   Why:  for fever greater than 100.4, severe swelling, difficulty breathing,  Contact information: [address] [id number] [place] [id number] [phone]         Allergies as of 07/25/2016   No Known Allergies     Medication List    TAKE these medications   acetaminophen 325 MG  tablet Commonly known as:  TYLENOL Take 3 tablets (975 mg total) by mouth every 6 (six) hours as needed for headache.   diphenhydrAMINE 25 mg capsule Commonly known as:  BENADRYL Take 1-2 capsules (25-50 mg total) by mouth every 6 (six) hours as needed.   ibuprofen 600 MG tablet Commonly known as:  ADVIL,MOTRIN Take 1 tablet (600 mg total) by mouth every 6 (six) hours as needed. Do not take after 28 weeks.   prenatal multivitamin Tabs tablet Take 1 tablet by mouth daily at 12 noon.        [place], [place], [place] 07/25/2016  11:28 PM

## 2016-07-25 NOTE — Discharge Instructions (Signed)
Post-Injection Inflammatory Reaction  A post-injection inflammatory reaction is swelling, irritation, and other problems that can develop after a person gets a shot (injection). The reaction can develop at and around the injection site or far away from the injection site. In some cases, it can develop right away and last for a short period of time. In other cases, it can develop weeks after the injection and last for several hours or days.  What are the causes?  This condition may be caused by:  An allergy.  A response by your body's defense system (immune response).  Damage to the surrounding tissue from the injection.  Germs that enter the body through the injection site.    What are the signs or symptoms?  Symptoms of this condition include:  Itchiness.  Redness.  Rash.  Warmth.  Swelling.  Tenderness.  Pain.  Fever or chills.  Muscle aches.  Nausea or vomiting.  Loss of appetite.  Headache.  Dizziness.    In severe cases, seizures, asthma, or a severe allergic reaction (anaphylaxis) can occur. These cases are rare.  How is this diagnosed?  This condition may be diagnosed with a physical exam. During the exam, a circle may be drawn around the injection site. The circle helps to show whether redness in the area is spreading.  How is this treated?  Treatment for this condition depends on what caused the reaction and how severe the reaction is. Treatments commonly involves:  Putting an ice pack over the injection site.  Taking a nonsteroidal anti-inflammatory drug (NSAID) to reduce swelling and itching.  Taking an antibiotic medicine.  Taking pain medicine.    If the reaction affects a joint, you may also need to rest that joint for a while.  Follow these instructions at home:  Keep the injection site clean.  Take over-the-counter and prescription medicines only as told by your health care provider.  If you were prescribed antibiotic medicine, take or apply it as told by your health care provider. Do not stop  taking or applying the antibiotic even if your condition improves.  If directed, apply ice to the injured area:  Put ice in a plastic bag.  Place a towel between your skin and the bag.  Leave the ice on for 20 minutes, 2-3 times per day.  If the reaction affects a joint, rest your joint. Ask your health care provider when you can start to use your joint again.  Do not drive or operate heavy machinery while taking prescription pain medicine.  Keep all follow-up visits as told by your health care provider. This is important.  Contact a health care provider if:  Your symptoms last for several hours.  You develop a fever or chills.  You develop muscle aches.  Get help right away if:  Your symptoms get worse.  You have trouble breathing.  You have seizures.  This information is not intended to replace advice given to you by your health care provider. Make sure you discuss any questions you have with your health care provider.  Document Released: 10/23/2010 Document Revised: 07/19/2015 Document Reviewed: 08/23/2014  Elsevier Interactive Patient Education  2018 Elsevier Inc.

## 2016-07-25 NOTE — MAU Note (Signed)
Sherri KinsmanVirginia Shepherd CNM in Triage to talk with pt

## 2016-07-25 NOTE — MAU Note (Signed)
Had 17P injection yesterday. Today I have a raised area and rash on my L upper arm where I had the shot. On my R arm where I had the shot 2 wks ago I feel a knot. No redness but can just feel the area.

## 2016-07-25 NOTE — Progress Notes (Signed)
Written and verbal d/c instructions given and understanding voiced. 

## 2016-07-30 NOTE — Progress Notes (Signed)
Sherri Shepherd contacted in regards to changing route of medication from SQ to IM.  Was informed that because we just received refill on auto injectors Sherri Shepherd, Sherri Shepherd representative stated that she will have to contact her supervisor to be able to get approval or denial in receiving emergency vial for pt's appt.  Sherri Shepherd stated that she will contact me in the decision.

## 2016-07-31 ENCOUNTER — Ambulatory Visit (INDEPENDENT_AMBULATORY_CARE_PROVIDER_SITE_OTHER): Payer: 59 | Admitting: Obstetrics and Gynecology

## 2016-07-31 VITALS — BP 123/70 | HR 77 | Wt 127.0 lb

## 2016-07-31 DIAGNOSIS — O0992 Supervision of high risk pregnancy, unspecified, second trimester: Secondary | ICD-10-CM

## 2016-07-31 DIAGNOSIS — O09212 Supervision of pregnancy with history of pre-term labor, second trimester: Secondary | ICD-10-CM

## 2016-07-31 DIAGNOSIS — O98811 Other maternal infectious and parasitic diseases complicating pregnancy, first trimester: Secondary | ICD-10-CM

## 2016-07-31 DIAGNOSIS — Z8751 Personal history of pre-term labor: Secondary | ICD-10-CM

## 2016-07-31 DIAGNOSIS — O98812 Other maternal infectious and parasitic diseases complicating pregnancy, second trimester: Secondary | ICD-10-CM

## 2016-07-31 DIAGNOSIS — O0990 Supervision of high risk pregnancy, unspecified, unspecified trimester: Secondary | ICD-10-CM

## 2016-07-31 DIAGNOSIS — A749 Chlamydial infection, unspecified: Secondary | ICD-10-CM

## 2016-07-31 NOTE — Progress Notes (Signed)
Contacted Derdlim, ext [phone], in regards to the decision on receiving IM Makena.  I was informed that the pt was approved to receive the IM medication and that an emergency vial will be sent to the office.  Derdlim informed me that [facility] will have medication sent before Monday due to issue with them trying to get out other medications for other provider offices.   Pt notified.

## 2016-07-31 NOTE — Progress Notes (Signed)
   PRENATAL VISIT NOTE  Subjective:  Sherri Shepherd is a 26 y.o. G3P0201 at 6568w4d being seen today for ongoing prenatal care.  She is currently monitored for the following issues for this high-risk pregnancy and has History of preterm delivery; Supervision of high risk pregnancy, antepartum; Chlamydia infection affecting pregnancy in first trimester; and Headache in pregnancy, antepartum, second trimester on her problem list.  Patient reports no complaints.  Contractions: Not present. Vag. Bleeding: None.  Movement: Present. Denies leaking of fluid.   The following portions of the patient's history were reviewed and updated as appropriate: allergies, current medications, past family history, past medical history, past social history, past surgical history and problem list. Problem list updated.  Objective:   Vitals:   07/31/16 1348  BP: 123/70  Pulse: 77  Weight: 127 lb (57.6 kg)    Fetal Status: Fetal Heart Rate (bpm): 147 Fundal Height: 20 cm Movement: Present     General:  Alert, oriented and cooperative. Patient is in no acute distress.  Skin: Skin is warm and dry. No rash noted.   Cardiovascular: Normal heart rate noted  Respiratory: Normal respiratory effort, no problems with respiration noted  Abdomen: Soft, gravid, appropriate for gestational age. Pain/Pressure: Present     Pelvic:  Cervical exam deferred        Extremities: Normal range of motion.  Edema: None  Mental Status: Normal mood and affect. Normal behavior. Normal judgment and thought content.   Assessment and Plan:  Pregnancy: G3P0201 at 1968w4d  1. Supervision of high risk pregnancy, antepartum Patient is doing well without complaints Follow up anatomy ultrasound on 6/15  2. Chlamydia infection affecting pregnancy in first trimester TOC neg  3. History of preterm delivery Continue weekly 17-P Follow up cervical lenght  Preterm labor symptoms and general obstetric precautions including but not limited  to vaginal bleeding, contractions, leaking of fluid and fetal movement were reviewed in detail with the patient. Please refer to After Visit Summary for other counseling recommendations.  No Follow-up on file.   Catalina AntiguaPeggy Tishanna Dunford, MD

## 2016-08-01 ENCOUNTER — Other Ambulatory Visit (HOSPITAL_COMMUNITY): Payer: 59

## 2016-08-02 ENCOUNTER — Inpatient Hospital Stay (HOSPITAL_COMMUNITY)
Admission: AD | Admit: 2016-08-02 | Discharge: 2016-08-02 | Disposition: A | Discharge status: Home / Self Care | Payer: 59 | Source: Ambulatory Visit | Attending: Obstetrics and Gynecology | Admitting: Obstetrics and Gynecology

## 2016-08-02 DIAGNOSIS — K0889 Other specified disorders of teeth and supporting structures: Secondary | ICD-10-CM | POA: Diagnosis present

## 2016-08-02 DIAGNOSIS — Z3A20 20 weeks gestation of pregnancy: Secondary | ICD-10-CM | POA: Insufficient documentation

## 2016-08-02 DIAGNOSIS — K029 Dental caries, unspecified: Secondary | ICD-10-CM | POA: Diagnosis not present

## 2016-08-02 DIAGNOSIS — O09212 Supervision of pregnancy with history of pre-term labor, second trimester: Secondary | ICD-10-CM | POA: Diagnosis not present

## 2016-08-02 DIAGNOSIS — R51 Headache: Secondary | ICD-10-CM | POA: Insufficient documentation

## 2016-08-02 DIAGNOSIS — O9989 Other specified diseases and conditions complicating pregnancy, childbirth and the puerperium: Secondary | ICD-10-CM

## 2016-08-02 MED ORDER — OXYCODONE-ACETAMINOPHEN 5-325 MG PO TABS: 1.0000 | ORAL_TABLET | ORAL | 0 refills | Status: DC | PRN

## 2016-08-02 MED ORDER — IBUPROFEN 600 MG PO TABS: 600.0000 mg | ORAL_TABLET | Freq: Four times a day (QID) | ORAL | 0 refills | Status: DC | PRN

## 2016-08-02 MED ORDER — OXYCODONE-ACETAMINOPHEN 5-325 MG PO TABS
2.0000 | ORAL_TABLET | Freq: Once | ORAL | Status: AC
  Administered 2016-08-02: 2 via ORAL
  Filled 2016-08-02: qty 2

## 2016-08-02 MED ORDER — IBUPROFEN 600 MG PO TABS
600.0000 mg | ORAL_TABLET | Freq: Once | ORAL | Status: AC
  Administered 2016-08-02: 600 mg via ORAL
  Filled 2016-08-02: qty 1

## 2016-08-02 NOTE — Discharge Instructions (Signed)
Dental Caries Dental caries are spots of decay (cavities) in teeth. They are in the outer layer of your tooth (enamel). Treat them as soon as you can. If they are not treated, they can spread decay and lead to painful infection. Follow these instructions at home: General instructions  Take good care of your mouth and teeth. This keeps them healthy. ? Brush your teeth 2 times a day. Use toothpaste with fluoride in it. ? Floss your teeth once a day.  If your dentist prescribed an antibiotic medicine to treat an infection, take it as told. Do not stop taking the antibiotic even if your condition gets better.  Keep all follow-up visits as told by your dentist. This is important. This includes all cleanings. Preventing dental caries  Brush your teeth every morning and night. Use fluoride toothpaste.  Get regular dental cleanings.  If you are at risk of dental caries. ? Wash your mouth with prescription mouthwash (chlorhexidine). ? Put topical fluoride on your teeth.  Drink water with fluoride in it.  Drink water instead of sugary drinks.  Eat healthy meals and snacks. Contact a doctor if:  You have symptoms of tooth decay. Summary  Dental caries are spots of decay (cavities) in teeth. They are in the outer layer of your tooth.  Take an antibiotic to treat an infection, if told by your dentist. Do not stop taking the antibiotic even if your condition gets better.  Regular dental cleanings and brushing can help prevent dental caries. This information is not intended to replace advice given to you by your health care provider. Make sure you discuss any questions you have with your health care provider. Document Released: 11/20/2007 Document Revised: 10/28/2015 Document Reviewed: 10/28/2015 Elsevier Interactive Patient Education  2017 Elsevier Inc.   Dental Extraction A dental extraction is the removal (extraction) of a tooth. You may need to have this procedure if:  You have  tooth decay.  You have gum disease.  You have an infection (abscess).  Room needs to be made for other teeth to grow in or to be lined up properly.  Baby teeth are stopping your adult teeth from coming to the surface (erupting).  You have a tooth fracture or fractures that cannot be fixed.  You are going to get radiation to your head and neck.  The type and length of procedure that you have depends on:  Why you are having a tooth removed.  Where the tooth is located that is being removed.  The procedure may be:  A simple extraction. This is done if you can see the tooth in your mouth and it is above the gumline.  A surgical extraction. This is done if the tooth has not come into the mouth or if the tooth is broken off below the gumline.  What happens before the procedure?  Ask your doctor about: ? Changing or stopping your regular medicines. This is important if you take diabetes medicines or blood thinners. ? Taking medicines such as aspirin and ibuprofen. These medicines can thin your blood. Do not take these medicines before your procedure if your doctor tells you not to.  Take medicines only as told by your doctor. This includes antibiotic medicines.  Ask your doctor if it is okay to eat or drink before your procedure.  Plan to have someone take you home after the procedure.  If you go home right after the procedure, plan to have someone with you for 24 hours. What happens during  the procedure?  You may be given one or more of these: ? A medicine that helps you relax (sedative). ? A medicine that numbs the area (local anesthetic). ? A medicine that makes you fall asleep (general anesthetic).  If you are having a simple extraction: ? Your dentist will loosen the tooth. This is done with a tool called an elevator. ? Your dentist will grab the tooth and remove it from the socket. This is done with a tool called forceps. ? The open socket will be cleaned. ? Gauze  will be placed in the socket. This helps with bleeding.  If you are having a surgical extraction: ? Your dentist will make a cut (incision) in the gum. ? Some of the bone around the tooth may need to be removed. ? The tooth will be removed. ? Stitches (sutures) may be needed to close the area. The procedure may vary among doctors and hospitals. What happens after the procedure?  You may have gauze in your mouth. If told by your doctor, gently press on the gauze for as long as one hour after the procedure.  A blood clot should start to form over the open socket. This is normal. Do not touch the area, and do not rinse it.  You may be given medicines to help with: ? Pain. ? Healing. This information is not intended to replace advice given to you by your health care provider. Make sure you discuss any questions you have with your health care provider. Document Released: 06/27/2014 Document Revised: 07/19/2015 Document Reviewed: 02/06/2014 Elsevier Interactive Patient Education  [employer].

## 2016-08-02 NOTE — MAU Provider Note (Signed)
History     CSN: [id number]  Arrival date and time: 08/02/16 2106   First Provider Initiated Contact with Patient 08/02/16 2130      Chief Complaint  Patient presents with  . Dental Pain   HPI  [name] is a 26 y.o. female G3P0201 @ 8163w6d here in MAU with tooth pain.  She had a filling in her tooth and 4 months ago the filling fell off. The tooth did not bother her until she became pregnant. The tooth started hurting this morning. The tooth is throbbing and the pain worsens when she closes her mouth. She tried taking tylenol which did not help. She denies abdominal pain, vaginal bleeding or leaking of fluid.   OB History    Gravida Para Term Preterm AB Living   3 2 0 2 0 1   SAB TAB Ectopic Multiple Live Births   0 0 0 0 1      Past Medical History:  Diagnosis Date  . Chlamydia Feb 2017  . H/O varicella   . Headache in pregnancy, antepartum, second trimester 06/25/2016  . Premature dilatation of cervix during pregnancy 10/01/2011   5/90%/-1  . Preterm delivery 12/04/2011   SVD at 2115w3d on 10/22/11  . Preterm labor     Past Surgical History:  Procedure Laterality Date  . NO PAST SURGERIES      Family History  Problem Relation Age of Onset  . Hypertension Mother   . Diabetes Brother   . Cancer Maternal Grandmother        LUNG  . Diabetes Maternal Grandfather   . Arthritis Paternal Grandmother   . Cancer Paternal Grandmother        BREAST  . Diabetes Maternal Uncle     Social History  Substance Use Topics  . Smoking status: Never Smoker  . Smokeless tobacco: Never Used  . Alcohol use No    Allergies: No Known Allergies  Facility-Administered Medications Prior to Admission  Medication Dose Route Frequency Provider Last Rate Last Dose  . HYDROXYprogesterone Caproate SOAJ 275 mg  275 mg Subcutaneous Weekly Constant, Peggy, MD   275 mg at 07/24/16 1315   Prescriptions Prior to Admission  Medication Sig Dispense Refill Last Dose  . acetaminophen  (TYLENOL) 325 MG tablet Take 3 tablets (975 mg total) by mouth every 6 (six) hours as needed for headache. 90 tablet 4 Taking  . diphenhydrAMINE (BENADRYL) 25 mg capsule Take 1-2 capsules (25-50 mg total) by mouth every 6 (six) hours as needed. (Patient not taking: Reported on 07/31/2016) 30 capsule 0 Not Taking  . Prenatal Vit-Fe Fumarate-FA (PRENATAL MULTIVITAMIN) TABS tablet Take 1 tablet by mouth daily at 12 noon. 30 tablet 0 Taking    Review of Systems  Constitutional: Negative for fever.  HENT: Positive for dental problem.   Gastrointestinal: Negative for abdominal pain.  Genitourinary: Negative for vaginal bleeding and vaginal discharge.   Physical Exam   Blood pressure 118/68, pulse 88, temperature 98.4 F (36.9 C), temperature source Oral, resp. rate 18, height 5\' 5"  (1.651 m), weight 131 lb (59.4 kg), last menstrual period 03/24/2016, unknown if currently breastfeeding.  Physical Exam  Constitutional: She is oriented to person, place, and time. She appears well-developed and well-nourished. No distress.  HENT:  Head: Normocephalic.  Mouth/Throat: Dental caries present. No dental abscesses.    Respiratory: Effort normal.  Musculoskeletal: Normal range of motion.  Neurological: She is alert and oriented to person, place, and time.  Skin: Skin is  warm. She is not diaphoretic.  Psychiatric: Her behavior is normal.    MAU Course  Procedures  None  MDM  + fetal heart tones via doppler 2 percocet given PO Ibuprofen 600 mg given X 1 dose  Assessment and Plan   A:  1. Pain due to dental caries     P:  Discharge home in stable condition No sign of abscess Patient plans to see her Dentist on Monday Dental letter given & Work note provided Rx: Percocet #15 no refill, ibuprofen If fever or worsening pain, go to [facility] ED  Rasch, Harolyn Rutherford, NP 08/02/2016 11:38 PM

## 2016-08-02 NOTE — MAU Note (Signed)
Pt reports her bottom Left  tooth started hurting this morning. Took some tylenol and left over penicillin without any relief. Will see dentist on Monday. Needs something for the pain.

## 2016-08-04 ENCOUNTER — Encounter: Payer: Self-pay | Admitting: Family Medicine

## 2016-08-08 ENCOUNTER — Encounter (HOSPITAL_COMMUNITY): Payer: Self-pay

## 2016-08-08 ENCOUNTER — Other Ambulatory Visit: Payer: Self-pay | Admitting: Obstetrics and Gynecology

## 2016-08-08 ENCOUNTER — Ambulatory Visit (HOSPITAL_COMMUNITY)
Admission: RE | Admit: 2016-08-08 | Discharge: 2016-08-08 | Disposition: A | Discharge status: Home / Self Care | Payer: 59 | Source: Ambulatory Visit | Attending: Obstetrics and Gynecology | Admitting: Obstetrics and Gynecology

## 2016-08-08 ENCOUNTER — Telehealth (HOSPITAL_COMMUNITY): Payer: Self-pay

## 2016-08-08 DIAGNOSIS — O09899 Supervision of other high risk pregnancies, unspecified trimester: Secondary | ICD-10-CM

## 2016-08-08 DIAGNOSIS — O09212 Supervision of pregnancy with history of pre-term labor, second trimester: Secondary | ICD-10-CM | POA: Insufficient documentation

## 2016-08-08 DIAGNOSIS — O321XX0 Maternal care for breech presentation, not applicable or unspecified: Secondary | ICD-10-CM | POA: Diagnosis not present

## 2016-08-08 DIAGNOSIS — Z3A21 21 weeks gestation of pregnancy: Secondary | ICD-10-CM | POA: Diagnosis not present

## 2016-08-08 DIAGNOSIS — Z362 Encounter for other antenatal screening follow-up: Secondary | ICD-10-CM | POA: Diagnosis present

## 2016-08-08 DIAGNOSIS — O09219 Supervision of pregnancy with history of pre-term labor, unspecified trimester: Secondary | ICD-10-CM

## 2016-08-08 NOTE — Progress Notes (Signed)
Patient has been contacted and informed of scheduling change. Her cerclage is not scheduled at 7 am on 6/16. The patient was instructed to arrive at 5:30am and reminded to remain NPO after 11pm. It was also explained to the patient that if an emergent case arises delaying hers, her case will be rescheduled for Sunday.

## 2016-08-08 NOTE — Telephone Encounter (Signed)
[facility] informed patient of surgery date and time. Pt expressed understanding. Advise her to call our office back if she had any questions and to expect a call from pre-op soon.

## 2016-08-09 ENCOUNTER — Inpatient Hospital Stay (HOSPITAL_COMMUNITY)
Admission: RE | Admit: 2016-08-09 | Discharge: 2016-08-09 | Disposition: A | Discharge status: Home / Self Care | Payer: 59 | Source: Ambulatory Visit | Attending: Obstetrics & Gynecology | Admitting: Obstetrics & Gynecology

## 2016-08-09 ENCOUNTER — Inpatient Hospital Stay (HOSPITAL_COMMUNITY): Payer: 59 | Admitting: Anesthesiology

## 2016-08-09 ENCOUNTER — Encounter (HOSPITAL_COMMUNITY)
Admission: RE | Disposition: A | Discharge status: Home / Self Care | Payer: Self-pay | Source: Ambulatory Visit | Attending: Obstetrics & Gynecology

## 2016-08-09 ENCOUNTER — Encounter (HOSPITAL_COMMUNITY): Payer: Self-pay | Admitting: Certified Nurse Midwife

## 2016-08-09 DIAGNOSIS — Z8249 Family history of ischemic heart disease and other diseases of the circulatory system: Secondary | ICD-10-CM | POA: Insufficient documentation

## 2016-08-09 DIAGNOSIS — Z833 Family history of diabetes mellitus: Secondary | ICD-10-CM | POA: Diagnosis not present

## 2016-08-09 DIAGNOSIS — O3432 Maternal care for cervical incompetence, second trimester: Secondary | ICD-10-CM

## 2016-08-09 DIAGNOSIS — Z8619 Personal history of other infectious and parasitic diseases: Secondary | ICD-10-CM | POA: Diagnosis not present

## 2016-08-09 DIAGNOSIS — O98811 Other maternal infectious and parasitic diseases complicating pregnancy, first trimester: Secondary | ICD-10-CM

## 2016-08-09 DIAGNOSIS — Z3A21 21 weeks gestation of pregnancy: Secondary | ICD-10-CM | POA: Diagnosis not present

## 2016-08-09 DIAGNOSIS — O26879 Cervical shortening, unspecified trimester: Secondary | ICD-10-CM

## 2016-08-09 DIAGNOSIS — O09299 Supervision of pregnancy with other poor reproductive or obstetric history, unspecified trimester: Secondary | ICD-10-CM

## 2016-08-09 DIAGNOSIS — A749 Chlamydial infection, unspecified: Secondary | ICD-10-CM

## 2016-08-09 DIAGNOSIS — R51 Headache: Secondary | ICD-10-CM

## 2016-08-09 DIAGNOSIS — O0990 Supervision of high risk pregnancy, unspecified, unspecified trimester: Secondary | ICD-10-CM

## 2016-08-09 DIAGNOSIS — Z8261 Family history of arthritis: Secondary | ICD-10-CM | POA: Diagnosis not present

## 2016-08-09 DIAGNOSIS — O26892 Other specified pregnancy related conditions, second trimester: Secondary | ICD-10-CM

## 2016-08-09 HISTORY — PX: CERVICAL CERCLAGE: SHX1329

## 2016-08-09 LAB — RAPID HIV SCREEN (HIV 1/2 AB+AG)
HIV 1/2 ANTIBODIES: NONREACTIVE
HIV-1 P24 ANTIGEN - HIV24: NONREACTIVE

## 2016-08-09 LAB — RPR: RPR Ser Ql: NONREACTIVE

## 2016-08-09 LAB — CBC
HEMATOCRIT: 33.2 % — AB (ref 36.0–46.0)
HEMOGLOBIN: 11.4 g/dL — AB (ref 12.0–15.0)
MCH: 31.3 pg (ref 26.0–34.0)
MCHC: 34.3 g/dL (ref 30.0–36.0)
MCV: 91.2 fL (ref 78.0–100.0)
Platelets: 220 10*3/uL (ref 150–400)
RBC: 3.64 MIL/uL — ABNORMAL LOW (ref 3.87–5.11)
RDW: 14.2 % (ref 11.5–15.5)
WBC: 9.3 10*3/uL (ref 4.0–10.5)

## 2016-08-09 SURGERY — CERCLAGE, CERVIX, VAGINAL APPROACH
Anesthesia: Spinal | Site: Vagina

## 2016-08-09 MED ORDER — BUPIVACAINE IN DEXTROSE 0.75-8.25 % IT SOLN
INTRATHECAL | Status: DC | PRN
  Administered 2016-08-09: 1 mL via INTRATHECAL

## 2016-08-09 MED ORDER — LIDOCAINE HCL 1 % IJ SOLN
INTRAMUSCULAR | Status: AC
  Filled 2016-08-09: qty 20

## 2016-08-09 MED ORDER — FAMOTIDINE IN NACL 20-0.9 MG/50ML-% IV SOLN
INTRAVENOUS | Status: AC
  Administered 2016-08-09: 20 mg via INTRAVENOUS
  Filled 2016-08-09: qty 50

## 2016-08-09 MED ORDER — DOCUSATE SODIUM 100 MG PO CAPS: 100.0000 mg | ORAL_CAPSULE | Freq: Two times a day (BID) | ORAL | 2 refills | Status: DC | PRN

## 2016-08-09 MED ORDER — SOD CITRATE-CITRIC ACID 500-334 MG/5ML PO SOLN
30.0000 mL | Freq: Once | ORAL | Status: AC
  Administered 2016-08-09: 30 mL via ORAL

## 2016-08-09 MED ORDER — OXYCODONE-ACETAMINOPHEN 5-325 MG PO TABS: 1.0000 | ORAL_TABLET | Freq: Four times a day (QID) | ORAL | 0 refills | Status: DC | PRN

## 2016-08-09 MED ORDER — FAMOTIDINE IN NACL 20-0.9 MG/50ML-% IV SOLN
20.0000 mg | Freq: Once | INTRAVENOUS | Status: AC
  Administered 2016-08-09: 20 mg via INTRAVENOUS

## 2016-08-09 MED ORDER — LIDOCAINE HCL 1 % IJ SOLN
INTRAMUSCULAR | Status: DC | PRN
  Administered 2016-08-09: 20 mL

## 2016-08-09 MED ORDER — SOD CITRATE-CITRIC ACID 500-334 MG/5ML PO SOLN
ORAL | Status: AC
  Administered 2016-08-09: 30 mL via ORAL
  Filled 2016-08-09: qty 15

## 2016-08-09 MED ORDER — BUPIVACAINE IN DEXTROSE 0.75-8.25 % IT SOLN
INTRATHECAL | Status: AC
  Filled 2016-08-09: qty 2

## 2016-08-09 MED ORDER — LACTATED RINGERS IV SOLN
INTRAVENOUS | Status: DC
  Administered 2016-08-09 (×2): via INTRAVENOUS

## 2016-08-09 SURGICAL SUPPLY — 17 items
CANISTER SUCT 3000ML PPV (MISCELLANEOUS) ×2 IMPLANT
COUNTER NEEDLE 1200 MAGNETIC (NEEDLE) ×2 IMPLANT
GLOVE BIOGEL PI IND STRL 6.5 (GLOVE) ×1 IMPLANT
GLOVE BIOGEL PI IND STRL 7.0 (GLOVE) ×1 IMPLANT
GLOVE BIOGEL PI INDICATOR 6.5 (GLOVE) ×1
GLOVE BIOGEL PI INDICATOR 7.0 (GLOVE) ×1
GLOVE SURG SS PI 6.5 STRL IVOR (GLOVE) ×2 IMPLANT
GOWN STRL REUS W/TWL LRG LVL3 (GOWN DISPOSABLE) ×4 IMPLANT
NS IRRIG 1000ML POUR BTL (IV SOLUTION) ×2 IMPLANT
PACK VAGINAL MINOR WOMEN LF (CUSTOM PROCEDURE TRAY) ×2 IMPLANT
PAD OB MATERNITY 4.3X12.25 (PERSONAL CARE ITEMS) ×2 IMPLANT
PAD PREP 24X48 CUFFED NSTRL (MISCELLANEOUS) ×2 IMPLANT
SUT PROLENE 0 CT 1 30 (SUTURE) ×2 IMPLANT
TOWEL OR 17X24 6PK STRL BLUE (TOWEL DISPOSABLE) ×4 IMPLANT
TRAY FOLEY CATH SILVER 14FR (SET/KITS/TRAYS/PACK) ×2 IMPLANT
TUBING NON-CON 1/4 X 20 CONN (TUBING) ×2 IMPLANT
YANKAUER SUCT BULB TIP NO VENT (SUCTIONS) ×2 IMPLANT

## 2016-08-09 NOTE — MAU Note (Signed)
Here for cerclage placement due to previous PTD. Denies any pain, bleeding, or LOF

## 2016-08-09 NOTE — Anesthesia Postprocedure Evaluation (Signed)
Anesthesia Post Note  Patient: Sherri Shepherd  Procedure(s) Performed: Procedure(s) (LRB): CERCLAGE CERVICAL (N/A)     Patient location during evaluation: PACU Anesthesia Type: Spinal Level of consciousness: awake and alert Pain management: pain level controlled Vital Signs Assessment: post-procedure vital signs reviewed and stable Respiratory status: spontaneous breathing and respiratory function stable Cardiovascular status: blood pressure returned to baseline and stable Postop Assessment: no headache, no backache and spinal receding Anesthetic complications: no    Last Vitals:  Vitals:   08/09/16 0830 08/09/16 0900  BP: 104/75 (!) 91/42  Pulse: 76 80  Resp: (!) 24 15  Temp:  37.1 C    Last Pain:  Vitals:   08/09/16 0830  PainSc: 0-No pain   Pain Goal:                 Phillips Groutarignan, Broox Lonigro

## 2016-08-09 NOTE — Progress Notes (Signed)
Pt doing well and able to get dressed in bed, standing without difficulty but unable to take steps forward, will continue to monitor and discharge when patient ambulatory

## 2016-08-09 NOTE — Anesthesia Preprocedure Evaluation (Signed)
Anesthesia Evaluation  Patient identified by MRN, date of birth, ID band Patient awake    Reviewed: Allergy & Precautions, NPO status , Patient's Chart, lab work & pertinent test results  Airway Mallampati: II  TM Distance: >3 FB Neck ROM: Full    Dental no notable dental hx.    Pulmonary neg pulmonary ROS,    Pulmonary exam normal breath sounds clear to auscultation       Cardiovascular negative cardio ROS Normal cardiovascular exam Rhythm:Regular Rate:Normal     Neuro/Psych negative neurological ROS  negative psych ROS   GI/Hepatic negative GI ROS, Neg liver ROS,   Endo/Other  negative endocrine ROS  Renal/GU negative Renal ROS  negative genitourinary   Musculoskeletal negative musculoskeletal ROS (+)   Abdominal   Peds negative pediatric ROS (+)  Hematology negative hematology ROS (+)   Anesthesia Other Findings   Reproductive/Obstetrics (+) Pregnancy                             Anesthesia Physical Anesthesia Plan  ASA: II  Anesthesia Plan: Spinal   Post-op Pain Management:    Induction:   PONV Risk Score and Plan: 0  Airway Management Planned: Natural Airway  Additional Equipment:   Intra-op Plan:   Post-operative Plan:   Informed Consent: I have reviewed the patients History and Physical, chart, labs and discussed the procedure including the risks, benefits and alternatives for the proposed anesthesia with the patient or authorized representative who has indicated his/her understanding and acceptance.   Dental advisory given  Plan Discussed with:   Anesthesia Plan Comments:         Anesthesia Quick Evaluation

## 2016-08-09 NOTE — Anesthesia Procedure Notes (Signed)
Spinal  Patient location during procedure: OR Staffing Anesthesiologist: CARIGNAN, PETER Performed: anesthesiologist  Preanesthetic Checklist Completed: patient identified, site marked, surgical consent, pre-op evaluation, timeout performed, IV checked, risks and benefits discussed and monitors and equipment checked Spinal Block Patient position: sitting Prep: DuraPrep Patient monitoring: heart rate, continuous pulse ox and blood pressure Approach: midline Location: L3-4 Injection technique: single-shot Needle Needle type: Sprotte  Needle gauge: 24 G Needle length: 9 cm Additional Notes Expiration date of kit checked and confirmed. Patient tolerated procedure well, without complications.       

## 2016-08-09 NOTE — H&P (Signed)
Sherri AntuJasmine J Shepherd is a 26 y.o. female G3P0201 at 1963w6d presenting for scheduled rescue cerclage. Patient with prenatal care complicated by late onset to care at 7455w4d, history of incompetent cervix receiving weekly 17-P. Patient had a cervical length on 6/1 of 3cm which was repeated yesterday and was noted to be 1.7cm. Patient was counselled by MFM and agreed to a rescue cerclage. Patient reports feeling well. She denies vaginal bleeding, cramping/contractions or leakage of fluid. She reports good fetal movement  OB History    Gravida Para Term Preterm AB Living   3 2 0 2 0 1   SAB TAB Ectopic Multiple Live Births   0 0 0 0 1     Past Medical History:  Diagnosis Date  . Chlamydia Feb 2017  . H/O varicella   . Headache in pregnancy, antepartum, second trimester 06/25/2016  . Premature dilatation of cervix during pregnancy 10/01/2011   5/90%/-1  . Preterm delivery 12/04/2011   SVD at 5067w3d on 10/22/11  . Preterm labor    Past Surgical History:  Procedure Laterality Date  . NO PAST SURGERIES     Family History: family history includes Arthritis in her paternal grandmother; Cancer in her maternal grandmother and paternal grandmother; Diabetes in her brother, maternal grandfather, and maternal uncle; Hypertension in her mother. Social History:  reports that she has never smoked. She has never used smokeless tobacco. She reports that she does not drink alcohol or use drugs.     Maternal Diabetes: No Genetic Screening: Normal Maternal Ultrasounds/Referrals: Normal Fetal Ultrasounds or other Referrals:  None Maternal Substance Abuse:  No Significant Maternal Medications:  None Significant Maternal Lab Results:  None Other Comments:  None  ROS  See pertinent in HPI History   Blood pressure 112/74, pulse 76, temperature 98.1 F (36.7 C), resp. rate 16, height 5\' 5"  (1.651 m), weight 127 lb (57.6 kg), last menstrual period 03/24/2016, unknown if currently breastfeeding. Exam Physical  Exam  GENERAL: Well-developed, well-nourished female in no acute distress.  LUNGS: Clear to auscultation bilaterally.  HEART: Regular rate and rhythm. ABDOMEN: Soft, nontender, gravid. PELVIC: Deferred to OR EXTREMITIES: No cyanosis, clubbing, or edema, 2+ distal pulses.  Prenatal labs: ABO, Rh: O/Positive/-- (04/12 0858) Antibody: Negative (04/12 0858) Rubella: 2.11 (04/12 0858) RPR: Non Reactive (04/12 0858)  HBsAg: Negative (04/12 0858)  HIV: Non Reactive (04/12 0858)  GBS:     Assessment/Plan: 26 yo G3P0201 at 9022w5d with history of incompetent cervix and short cervix here for rescue cerclage - Risks, benefits and alternatives were explained including but not limited to risks of bleeding, infection and rupture of membrane. Patient verbalized understanding and all questions were answered  Aiken Withem 08/09/2016, 6:08 AM

## 2016-08-09 NOTE — Op Note (Signed)
Sherri Shepherd   PROCEDURE DATE: 08/09/2016  PREOPERATIVE DIAGNOSIS: Intrauterine pregnancy at 5024w6d, history of cervical incompetence and short cervix  POSTOPERATIVE DIAGNOSIS: The same PROCEDURE: Transvaginal McDonald Cervical Cerclage Placement SURGEON:  Dr. Catalina AntiguaPeggy Geneva Barrero  INDICATIONS: 26 y.o. [id number] at 7624w6d with short cervix and history of cervical incompetence, here for rescue cerclage placement.   The risks of surgery were discussed in detail with the patient including but not limited to: bleeding; infection which may require antibiotic therapy; injury to cervix, vagina other surrounding organs; risk of ruptured membranes and/or preterm delivery and other postoperative or anesthesia complications.  Written informed consent was obtained.    FINDINGS:  About 2 cm palpable cervical length in the vagina, closed cervix, suture knot placed anteriorly.  ANESTHESIA:  Spinal INTRAVENOUS FLUIDS: 700  ml ESTIMATED BLOOD LOSS: 20 ml URINE OUTPUT: 30 cc COMPLICATIONS: None immediate  PROCEDURE IN DETAIL:  The patient had sequential compression devices applied to her lower extremities while in the preoperative area.  Reassuring fetal heart rate was also obtained using a doppler. She was then taken to the operating room where spinal anesthesia was administered and was found to be adequate.  She was placed in the dorsal lithotomy, and was prepped and draped in a sterile manner. Her bladder was catheterized for an unmeasured amount of clear, yellow urine.  After an adequate timeout was performed, a vaginal speculum was then placed in the patient's vagina and a single tooth tenaculum was applied to the anterior lip of the cervix.  A paracervical block using 1% Marcaine was administered.  The anterior and posterior lips of the cervix was grasped with ring forceps. A curved needle loaded with a number 1 Prolene suture was inserted at 12 o'clock, as high as possible at the junction of the rugated vaginal  epithelium and the smooth cervix, at least 2 cm above the external os.  Four bites are taken circumferentially around the entire cervix in a purse-string fashion, each bite should be deep enough to extend at least midway into the cervical stroma, but not into the endocervical canal. The two ends of the suture were then tied securely anteriorly and cut, leaving the ends long enough to grasp with a clamp when it is time to remove it. There was minimal bleeding noted and the ring forceps were removed with good hemostasis noted.  All instruments were removed from the patient's vagina.  Instrument, needle and sponge counts were correct x 2. The patient tolerated the procedure well, and was taken to the recovery area awake and in stable condition. Reassuring fetal heart rate was also obtained using a doppler in the recovery area.  The patient will be discharged to home as per PACU criteria.  Routine postoperative instructions given.  She was prescribed Percocet and Colace.  She will follow up in the clinic in 2 weeks for postoperative evaluation and ongoing prenatal care

## 2016-08-09 NOTE — Discharge Instructions (Signed)
Cervical Cerclage, Care After  This sheet gives you information about how to care for yourself after your procedure. Your health care provider may also give you more specific instructions. If you have problems or questions, contact your health care provider.  What can I expect after the procedure?  After your procedure, it is common to have:   Cramping in your abdomen.   Mucus discharge for several days.   Painful urination (dysuria).   Small drops of blood coming from your vagina (spotting).    Follow these instructions at home:   Follow instructions from your health care provider about bed rest, if this applies. You may need to be on bed rest for up to 3 days.   Take over-the-counter and prescription medicines only as told by your health care provider.   Do not drive or use heavy machinery while taking prescription pain medicine.   Keep track of your vaginal discharge and watch for any changes. If you notice changes, tell your health care provider.   Avoid physical activities and exercise until your health care provider approves. Ask your health care provider what activities are safe for you.   Until your health care provider approves:  ? Do not douche.  ? Do not have sexual intercourse.   Keep all pre-birth (prenatal) visits and all follow-up visits as told by your health care provider. This is important. You will probably have weekly visits to have your cervix checked, and you may need an ultrasound.    Contact a health care provider if:   You have abnormal or bad-smelling vaginal discharge, such as clots.   You develop a rash on your skin. This may look like redness and swelling.   You become light-headed or feel like you are going to faint.   You have abdominal pain that does not get better with medicine.   You have persistent nausea or vomiting.    Get help right away if:   You have vaginal bleeding that is heavier or more frequent than spotting.   You are leaking fluid or have a gush of  fluid from your vagina (your water breaks).   You have a fever or chills.   You faint.   You have uterine contractions. These may feel like:  ? A back ache.  ? Lower abdominal pain.  ? Mild cramps, similar to menstrual cramps.  ? Tightening or pressure in your abdomen.   You think that your baby is not moving as much as usual, or you cannot feel your baby move.   You have chest pain.   You have shortness of breath.  This information is not intended to replace advice given to you by your health care provider. Make sure you discuss any questions you have with your health care provider.    Document Released: 12/01/2012 Document Revised: 10/10/2015 Document Reviewed: 09/14/2015  Elsevier Interactive Patient Education  2018 Elsevier Inc[employer]

## 2016-08-09 NOTE — Transfer of Care (Signed)
Immediate Anesthesia Transfer of Care Note  Patient: Sherri AntuJasmine J Segreto  Procedure(s) Performed: Procedure(s): CERCLAGE CERVICAL (N/A)  Patient Location: PACU  Anesthesia Type:Spinal  Level of Consciousness: awake and alert   Airway & Oxygen Therapy: Patient Spontanous Breathing  Post-op Assessment: Report given to RN and Post -op Vital signs reviewed and stable  Post vital signs: Reviewed  Last Vitals:  Vitals:   08/09/16 0552  BP: 112/74  Pulse: 76  Resp: 16  Temp: 36.7 C    Last Pain: There were no vitals filed for this visit.       Complications: No apparent anesthesia complications

## 2016-08-15 ENCOUNTER — Ambulatory Visit (HOSPITAL_COMMUNITY): Payer: 59

## 2016-08-22 ENCOUNTER — Ambulatory Visit (HOSPITAL_COMMUNITY)
Admission: RE | Admit: 2016-08-22 | Discharge: 2016-08-22 | Disposition: A | Discharge status: Home / Self Care | Payer: 59 | Source: Ambulatory Visit | Attending: Obstetrics and Gynecology | Admitting: Obstetrics and Gynecology

## 2016-08-22 ENCOUNTER — Other Ambulatory Visit (HOSPITAL_COMMUNITY): Payer: Self-pay | Admitting: Obstetrics and Gynecology

## 2016-08-22 ENCOUNTER — Encounter (HOSPITAL_COMMUNITY): Payer: Self-pay

## 2016-08-22 ENCOUNTER — Other Ambulatory Visit (HOSPITAL_COMMUNITY): Payer: Self-pay | Admitting: *Deleted

## 2016-08-22 DIAGNOSIS — O09899 Supervision of other high risk pregnancies, unspecified trimester: Secondary | ICD-10-CM

## 2016-08-22 DIAGNOSIS — O3432 Maternal care for cervical incompetence, second trimester: Secondary | ICD-10-CM

## 2016-08-22 DIAGNOSIS — O09219 Supervision of pregnancy with history of pre-term labor, unspecified trimester: Secondary | ICD-10-CM | POA: Diagnosis present

## 2016-08-22 DIAGNOSIS — Z3A23 23 weeks gestation of pregnancy: Secondary | ICD-10-CM

## 2016-08-22 DIAGNOSIS — O321XX0 Maternal care for breech presentation, not applicable or unspecified: Secondary | ICD-10-CM | POA: Diagnosis not present

## 2016-08-22 DIAGNOSIS — Z3686 Encounter for antenatal screening for cervical length: Secondary | ICD-10-CM | POA: Diagnosis not present

## 2016-08-26 ENCOUNTER — Ambulatory Visit (INDEPENDENT_AMBULATORY_CARE_PROVIDER_SITE_OTHER): Payer: 59 | Admitting: Obstetrics and Gynecology

## 2016-08-26 VITALS — BP 118/69 | HR 100 | Wt 123.3 lb

## 2016-08-26 DIAGNOSIS — O09212 Supervision of pregnancy with history of pre-term labor, second trimester: Secondary | ICD-10-CM | POA: Diagnosis not present

## 2016-08-26 DIAGNOSIS — O0990 Supervision of high risk pregnancy, unspecified, unspecified trimester: Secondary | ICD-10-CM

## 2016-08-26 DIAGNOSIS — O26872 Cervical shortening, second trimester: Secondary | ICD-10-CM

## 2016-08-26 DIAGNOSIS — O0992 Supervision of high risk pregnancy, unspecified, second trimester: Secondary | ICD-10-CM | POA: Diagnosis not present

## 2016-08-26 DIAGNOSIS — O26879 Cervical shortening, unspecified trimester: Secondary | ICD-10-CM

## 2016-08-26 DIAGNOSIS — Z8751 Personal history of pre-term labor: Secondary | ICD-10-CM

## 2016-08-26 DIAGNOSIS — O09299 Supervision of pregnancy with other poor reproductive or obstetric history, unspecified trimester: Secondary | ICD-10-CM

## 2016-08-26 DIAGNOSIS — O09292 Supervision of pregnancy with other poor reproductive or obstetric history, second trimester: Secondary | ICD-10-CM | POA: Diagnosis not present

## 2016-08-26 MED ORDER — HYDROXYPROGESTERONE CAPROATE 250 MG/ML IM OIL
250.0000 mg | TOPICAL_OIL | INTRAMUSCULAR | Status: AC
  Administered 2016-08-26 – 2016-11-17 (×9): 250 mg via INTRAMUSCULAR

## 2016-08-26 NOTE — Progress Notes (Signed)
   PRENATAL VISIT NOTE  Subjective:  Sherri Shepherd is a 26 y.o. G3P0201 at 6113w2d being seen today for ongoing prenatal care.  She is currently monitored for the following issues for this high-risk pregnancy and has History of preterm delivery; Supervision of high risk pregnancy, antepartum; Chlamydia infection affecting pregnancy in first trimester; Headache in pregnancy, antepartum, second trimester; Cervical shortening in second trimester; History of incompetent cervix, currently pregnant; and Cervical shortening during pregnancy, antepartum on her problem list.  Patient reports no complaints. She is back at work at [employer] without complaints.  Contractions: Not present. Vag. Bleeding: None.  Movement: Present. Denies leaking of fluid.   The following portions of the patient's history were reviewed and updated as appropriate: allergies, current medications, past family history, past medical history, past social history, past surgical history and problem list. Problem list updated.  Objective:   Vitals:   08/26/16 1259  BP: 118/69  Pulse: 100  Weight: 123 lb 4.8 oz (55.9 kg)    Fetal Status: Fetal Heart Rate (bpm): 148 Fundal Height: 24 cm Movement: Present     General:  Alert, oriented and cooperative. Patient is in no acute distress.  Skin: Skin is warm and dry. No rash noted.   Cardiovascular: Normal heart rate noted  Respiratory: Normal respiratory effort, no problems with respiration noted  Abdomen: Soft, gravid, appropriate for gestational age. Pain/Pressure: Absent     Pelvic:  Cervical exam deferred        Extremities: Normal range of motion.  Edema: None  Mental Status: Normal mood and affect. Normal behavior. Normal judgment and thought content.   Assessment and Plan:  Pregnancy: G3P0201 at 5213w2d  1. Cervical shortening during pregnancy, antepartum S/p cerclage on 6/16 6/29 CL 3cm  2. History of incompetent cervix, currently pregnant S/p cerclage Continue  weekly 17-P  3. Supervision of high risk pregnancy, antepartum Patient is doing well without complaints Third trimester labs and glucola next visit  4. History of preterm delivery See above  Preterm labor symptoms and general obstetric precautions including but not limited to vaginal bleeding, contractions, leaking of fluid and fetal movement were reviewed in detail with the patient. Please refer to After Visit Summary for other counseling recommendations.  Return in about 3 weeks (around 09/16/2016) for ROB, 2 hr glucola next visit.   Catalina AntiguaPeggy Anedra Penafiel, MD

## 2016-08-26 NOTE — Addendum Note (Signed)
Addended by: Lorelle GibbsWILSON, CHIQUITA L on: 08/26/2016 01:45 PM   Modules accepted: Orders

## 2016-08-29 ENCOUNTER — Other Ambulatory Visit (HOSPITAL_COMMUNITY): Payer: 59

## 2016-09-03 ENCOUNTER — Encounter: Payer: Self-pay | Admitting: Family Medicine

## 2016-09-05 ENCOUNTER — Ambulatory Visit (HOSPITAL_COMMUNITY)
Admission: RE | Admit: 2016-09-05 | Discharge: 2016-09-05 | Disposition: A | Discharge status: Home / Self Care | Payer: 59 | Source: Ambulatory Visit | Attending: Obstetrics and Gynecology | Admitting: Obstetrics and Gynecology

## 2016-09-05 ENCOUNTER — Encounter (HOSPITAL_COMMUNITY): Payer: Self-pay

## 2016-09-05 DIAGNOSIS — A749 Chlamydial infection, unspecified: Secondary | ICD-10-CM

## 2016-09-05 DIAGNOSIS — O3432 Maternal care for cervical incompetence, second trimester: Secondary | ICD-10-CM | POA: Insufficient documentation

## 2016-09-05 DIAGNOSIS — Z3686 Encounter for antenatal screening for cervical length: Secondary | ICD-10-CM | POA: Insufficient documentation

## 2016-09-05 DIAGNOSIS — O09212 Supervision of pregnancy with history of pre-term labor, second trimester: Secondary | ICD-10-CM | POA: Insufficient documentation

## 2016-09-05 DIAGNOSIS — O98811 Other maternal infectious and parasitic diseases complicating pregnancy, first trimester: Secondary | ICD-10-CM

## 2016-09-05 DIAGNOSIS — O09899 Supervision of other high risk pregnancies, unspecified trimester: Secondary | ICD-10-CM

## 2016-09-05 DIAGNOSIS — O26879 Cervical shortening, unspecified trimester: Secondary | ICD-10-CM

## 2016-09-05 DIAGNOSIS — R51 Headache: Secondary | ICD-10-CM

## 2016-09-05 DIAGNOSIS — O26892 Other specified pregnancy related conditions, second trimester: Secondary | ICD-10-CM

## 2016-09-05 DIAGNOSIS — Z3A25 25 weeks gestation of pregnancy: Secondary | ICD-10-CM | POA: Insufficient documentation

## 2016-09-05 DIAGNOSIS — O0990 Supervision of high risk pregnancy, unspecified, unspecified trimester: Secondary | ICD-10-CM

## 2016-09-05 DIAGNOSIS — O09219 Supervision of pregnancy with history of pre-term labor, unspecified trimester: Secondary | ICD-10-CM

## 2016-09-12 ENCOUNTER — Other Ambulatory Visit (HOSPITAL_COMMUNITY): Payer: 59

## 2016-09-15 ENCOUNTER — Ambulatory Visit (INDEPENDENT_AMBULATORY_CARE_PROVIDER_SITE_OTHER): Payer: 59 | Admitting: Obstetrics and Gynecology

## 2016-09-15 ENCOUNTER — Encounter: Payer: Self-pay | Admitting: Obstetrics and Gynecology

## 2016-09-15 VITALS — BP 111/80 | HR 75 | Wt 127.7 lb

## 2016-09-15 DIAGNOSIS — O09292 Supervision of pregnancy with other poor reproductive or obstetric history, second trimester: Secondary | ICD-10-CM

## 2016-09-15 DIAGNOSIS — Z23 Encounter for immunization: Secondary | ICD-10-CM | POA: Diagnosis not present

## 2016-09-15 DIAGNOSIS — O98811 Other maternal infectious and parasitic diseases complicating pregnancy, first trimester: Secondary | ICD-10-CM

## 2016-09-15 DIAGNOSIS — O98812 Other maternal infectious and parasitic diseases complicating pregnancy, second trimester: Secondary | ICD-10-CM

## 2016-09-15 DIAGNOSIS — O26879 Cervical shortening, unspecified trimester: Secondary | ICD-10-CM

## 2016-09-15 DIAGNOSIS — A749 Chlamydial infection, unspecified: Secondary | ICD-10-CM

## 2016-09-15 DIAGNOSIS — O0990 Supervision of high risk pregnancy, unspecified, unspecified trimester: Secondary | ICD-10-CM

## 2016-09-15 DIAGNOSIS — O0992 Supervision of high risk pregnancy, unspecified, second trimester: Secondary | ICD-10-CM

## 2016-09-15 DIAGNOSIS — O09299 Supervision of pregnancy with other poor reproductive or obstetric history, unspecified trimester: Secondary | ICD-10-CM

## 2016-09-15 DIAGNOSIS — O26872 Cervical shortening, second trimester: Secondary | ICD-10-CM

## 2016-09-15 DIAGNOSIS — Z8751 Personal history of pre-term labor: Secondary | ICD-10-CM

## 2016-09-15 NOTE — Progress Notes (Signed)
27

## 2016-09-15 NOTE — Progress Notes (Signed)
Prenatal Visit Note Date: 09/15/2016 Clinic: [facility]  Subjective:  Sherri Shepherd is a 26 y.o. [phone] at 2658w1d being seen today for ongoing prenatal care.  She is currently monitored for the following issues for this high-risk pregnancy and has History of preterm delivery; Supervision of high risk pregnancy, antepartum; Chlamydia infection affecting pregnancy in first trimester; Headache in pregnancy, antepartum, second trimester; History of incompetent cervix, currently pregnant; and Cervical shortening during pregnancy, antepartum on her problem list.  Patient reports no complaints.   Contractions: Not present. Vag. Bleeding: None.  Movement: Present. Denies leaking of fluid.   The following portions of the patient's history were reviewed and updated as appropriate: allergies, current medications, past family history, past medical history, past social history, past surgical history and problem list. Problem list updated.  Objective:   Vitals:   09/15/16 0750  BP: 111/80  Pulse: 75  Weight: 127 lb 11.2 oz (57.9 kg)    Fetal Status: Fetal Heart Rate (bpm): 150 Fundal Height: 27 cm Movement: Present     General:  Alert, oriented and cooperative. Patient is in no acute distress.  Skin: Skin is warm and dry. No rash noted.   Cardiovascular: Normal heart rate noted  Respiratory: Normal respiratory effort, no problems with respiration noted  Abdomen: Soft, gravid, appropriate for gestational age. Pain/Pressure: Absent     Pelvic:  Cervical exam deferred        Extremities: Normal range of motion.  Edema: None  Mental Status: Normal mood and affect. Normal behavior. Normal judgment and thought content.   Urinalysis:      Assessment and Plan:  Pregnancy: G3P0201 at 1458w1d  1. Supervision of high risk pregnancy, antepartum Routine care. D/w pt re: BC nv - Glucose Tolerance, 2 Hours w/1 Hour - HIV antibody - RPR - CBC - Tdap vaccine greater than or equal  to 7yo IM  2. Chlamydia infection affecting pregnancy in first trimester TOC neg. Rpt at 36ks  3. History of incompetent cervix, currently pregnant See below  4. Cervical shortening during pregnancy, antepartum Has rescue mcdonald cerclage in place. Remove at 37wks  5. History of preterm delivery Didn't know she had to come qwk for 17p. 17p today and told her about need for qwk injections.   Preterm labor symptoms and general obstetric precautions including but not limited to vaginal bleeding, contractions, leaking of fluid and fetal movement were reviewed in detail with the patient. Please refer to After Visit Summary for other counseling recommendations.  Return in about 1 week (around 09/22/2016).and 2wk rob   [place] BingPickens, Idaliz Tinkle, MD

## 2016-09-16 LAB — GLUCOSE TOLERANCE, 2 HOURS W/ 1HR
GLUCOSE, 1 HOUR: 144 mg/dL (ref 65–179)
GLUCOSE, 2 HOUR: 90 mg/dL (ref 65–152)
Glucose, Fasting: 73 mg/dL (ref 65–91)

## 2016-09-16 LAB — CBC
Hematocrit: 36.9 % (ref 34.0–46.6)
Hemoglobin: 11.9 g/dL (ref 11.1–15.9)
MCH: 31.2 pg (ref 26.6–33.0)
MCHC: 32.2 g/dL (ref 31.5–35.7)
MCV: 97 fL (ref 79–97)
PLATELETS: 229 10*3/uL (ref 150–379)
RBC: 3.82 x10E6/uL (ref 3.77–5.28)
RDW: 14.5 % (ref 12.3–15.4)
WBC: 9.9 10*3/uL (ref 3.4–10.8)

## 2016-09-16 LAB — RPR: RPR Ser Ql: NONREACTIVE

## 2016-09-16 LAB — HIV ANTIBODY (ROUTINE TESTING W REFLEX): HIV SCREEN 4TH GENERATION: NONREACTIVE

## 2016-09-22 ENCOUNTER — Ambulatory Visit (INDEPENDENT_AMBULATORY_CARE_PROVIDER_SITE_OTHER): Payer: 59 | Admitting: *Deleted

## 2016-09-22 VITALS — BP 110/72 | HR 80

## 2016-09-22 DIAGNOSIS — O09212 Supervision of pregnancy with history of pre-term labor, second trimester: Secondary | ICD-10-CM

## 2016-09-22 DIAGNOSIS — O0992 Supervision of high risk pregnancy, unspecified, second trimester: Secondary | ICD-10-CM

## 2016-09-22 DIAGNOSIS — O0990 Supervision of high risk pregnancy, unspecified, unspecified trimester: Secondary | ICD-10-CM

## 2016-09-22 DIAGNOSIS — O09892 Supervision of other high risk pregnancies, second trimester: Secondary | ICD-10-CM

## 2016-09-23 ENCOUNTER — Encounter: Payer: 59 | Admitting: Obstetrics and Gynecology

## 2016-09-29 ENCOUNTER — Ambulatory Visit (INDEPENDENT_AMBULATORY_CARE_PROVIDER_SITE_OTHER): Payer: 59 | Admitting: Obstetrics and Gynecology

## 2016-09-29 VITALS — BP 108/61 | HR 71 | Wt 130.1 lb

## 2016-09-29 DIAGNOSIS — O0993 Supervision of high risk pregnancy, unspecified, third trimester: Secondary | ICD-10-CM

## 2016-09-29 DIAGNOSIS — O26873 Cervical shortening, third trimester: Secondary | ICD-10-CM

## 2016-09-29 DIAGNOSIS — O26879 Cervical shortening, unspecified trimester: Secondary | ICD-10-CM

## 2016-09-29 DIAGNOSIS — O09213 Supervision of pregnancy with history of pre-term labor, third trimester: Secondary | ICD-10-CM

## 2016-09-29 DIAGNOSIS — O0990 Supervision of high risk pregnancy, unspecified, unspecified trimester: Secondary | ICD-10-CM

## 2016-09-29 DIAGNOSIS — Z8751 Personal history of pre-term labor: Secondary | ICD-10-CM

## 2016-09-29 NOTE — Progress Notes (Signed)
Prenatal Visit Note Date: 09/29/2016 Clinic: [facility]  Subjective:  Lenell AntuJasmine J Ficco is a 26 y.o. [phone] at 5436w1d being seen today for ongoing prenatal care.  She is currently monitored for the following issues for this high-risk pregnancy and has History of preterm delivery; Supervision of high risk pregnancy, antepartum; Chlamydia infection affecting pregnancy in first trimester; Headache in pregnancy, antepartum, second trimester; History of incompetent cervix, currently pregnant; and Cervical shortening during pregnancy, antepartum on her problem list.  Patient reports no complaints.   Contractions: Not present. Vag. Bleeding: None.  Movement: Present. Denies leaking of fluid.   The following portions of the patient's history were reviewed and updated as appropriate: allergies, current medications, past family history, past medical history, past social history, past surgical history and problem list. Problem list updated.  Objective:   Vitals:   09/29/16 1008  BP: 108/61  Pulse: 71  Weight: 130 lb 1.6 oz (59 kg)    Fetal Status: Fetal Heart Rate (bpm): 141 Fundal Height: 29 cm Movement: Present     General:  Alert, oriented and cooperative. Patient is in no acute distress.  Skin: Skin is warm and dry. No rash noted.   Cardiovascular: Normal heart rate noted  Respiratory: Normal respiratory effort, no problems with respiration noted  Abdomen: Soft, gravid, appropriate for gestational age. Pain/Pressure: Absent     Pelvic:  Cervical exam deferred        Extremities: Normal range of motion.     Mental Status: Normal mood and affect. Normal behavior. Normal judgment and thought content.   Urinalysis:      Assessment and Plan:  Pregnancy: G3P0201 at 6736w1d  1. Supervision of high risk pregnancy, antepartum Routine care. D/w pt re: BC nv  2. Chlamydia infection affecting pregnancy in first trimester TOC neg. Rpt at 36ks  3. History of incompetent  cervix, currently pregnant See below  4. Cervical shortening during pregnancy, antepartum Has rescue mcdonald cerclage in place. Remove at 37wks  5. History of preterm delivery 17p today and continue qwk inj  Preterm labor symptoms and general obstetric precautions including but not limited to vaginal bleeding, contractions, leaking of fluid and fetal movement were reviewed in detail with the patient. Please refer to After Visit Summary for other counseling recommendations.  Return in about 1 week (around 10/06/2016) for 17p and 2wks for 17p and rob.   [place] BingPickens, Katsumi Wisler, MD

## 2016-10-06 ENCOUNTER — Ambulatory Visit (INDEPENDENT_AMBULATORY_CARE_PROVIDER_SITE_OTHER): Payer: 59

## 2016-10-06 VITALS — BP 108/67 | HR 79 | Wt 128.4 lb

## 2016-10-06 DIAGNOSIS — Z8751 Personal history of pre-term labor: Secondary | ICD-10-CM

## 2016-10-06 DIAGNOSIS — O09213 Supervision of pregnancy with history of pre-term labor, third trimester: Secondary | ICD-10-CM | POA: Diagnosis not present

## 2016-10-06 NOTE — Patient Instructions (Signed)
Hydroxyprogesterone solution for injection What is this medicine? HYDROXYPROGESTERONE (hye drox ee proe JES ter one) is a female hormone. This medicine is used in women who are pregnant and who have delivered a baby too early (preterm) in the past. It helps lower the risk of having a preterm baby again. This medicine may be used for other purposes; ask your health care provider or pharmacist if you have questions. COMMON BRAND NAME(S): Makena What should I tell my health care provider before I take this medicine? They need to know if you have any of these conditions: -blood clotting disorders -breast, cervical, uterine, or vaginal cancer -depression -diabetes or prediabetes -heart disease -high blood pressure -kidney disease -liver disease -lung or breathing disease, like asthma -migraine headaches -seizures -vaginal bleeding -an unusual or allergic reaction to hydroxyprogesterone, other hormones, medicines, foods, dyes, castor oil, benzyl alcohol, or other preservatives -breast-feeding How should I use this medicine? This medicine is for injection into a muscle. It is given by a health care professional in a hospital or clinic setting. You are likely to get an injection once a week to prevent preterm delivery. Talk to your pediatrician regarding the use of this medicine in children. Special care may be needed. Overdosage: If you think you have taken too much of this medicine contact a poison control center or emergency room at once. NOTE: This medicine is only for you. Do not share this medicine with others. What if I miss a dose? It is important not to miss your dose. Call your doctor or health care professional if you are unable to keep an appointment. What may interact with this medicine? -acetaminophen -bupropion -clozapine -efavirenz -halothane -methadone -nicotine -theophylline, aminophylline -tizanidine This list may not describe all possible interactions. Give your  health care provider a list of all the medicines, herbs, non-prescription drugs, or dietary supplements you use. Also tell them if you smoke, drink alcohol, or use illegal drugs. Some items may interact with your medicine. What should I watch for while using this medicine? Your condition will be monitored carefully while you are receiving this medicine. What side effects may I notice from receiving this medicine? Side effects that you should report to your doctor or health care professional as soon as possible: -allergic reactions like skin rash, itching or hives, swelling of the face, lips, or tongue -breathing problems -breast tissue changes or discharge -changes in vision -confusion, trouble speaking or understanding -depressed mood -increased hunger or thirst -increased urination -pain, redness, or irritation at site where injected -pain, swelling, warmth in the leg -shortness of breath, chest pain, swelling in a leg -sudden numbness or weakness of the face, arm or leg -sudden severe headaches -trouble walking, dizziness, loss of balance or coordination -unusually weak or tired -vaginal bleeding -yellowing of the eyes or skin Side effects that usually do not require medical attention (report to your doctor or health care professional if they continue or are bothersome): -changes in emotions or moods -diarrhea -fluid retention and swelling -nausea This list may not describe all possible side effects. Call your doctor for medical advice about side effects. You may report side effects to FDA at 1-800-FDA-1088. Where should I keep my medicine? This drug is given in a hospital or clinic and will not be stored at home. NOTE: This sheet is a summary. It may not cover all possible information. If you have questions about this medicine, talk to your doctor, pharmacist, or health care provider.  2018 Elsevier/Gold Standard (2009-04-03 11:17:12)  

## 2016-10-14 ENCOUNTER — Ambulatory Visit (INDEPENDENT_AMBULATORY_CARE_PROVIDER_SITE_OTHER): Payer: 59 | Admitting: Obstetrics and Gynecology

## 2016-10-14 VITALS — BP 101/68 | HR 96 | Wt 131.5 lb

## 2016-10-14 DIAGNOSIS — O09213 Supervision of pregnancy with history of pre-term labor, third trimester: Secondary | ICD-10-CM | POA: Diagnosis not present

## 2016-10-14 DIAGNOSIS — O0990 Supervision of high risk pregnancy, unspecified, unspecified trimester: Secondary | ICD-10-CM

## 2016-10-14 DIAGNOSIS — O0993 Supervision of high risk pregnancy, unspecified, third trimester: Secondary | ICD-10-CM

## 2016-10-14 DIAGNOSIS — O09299 Supervision of pregnancy with other poor reproductive or obstetric history, unspecified trimester: Secondary | ICD-10-CM

## 2016-10-14 DIAGNOSIS — O09293 Supervision of pregnancy with other poor reproductive or obstetric history, third trimester: Secondary | ICD-10-CM

## 2016-10-14 NOTE — Progress Notes (Signed)
Prenatal Visit Note Date: 10/14/2016 Clinic: [facility]  Subjective:  Sherri Shepherd is a 26 y.o. [phone] at [redacted]w[redacted]d being seen today for ongoing prenatal care.  She is currently monitored for the following issues for this high-risk pregnancy and has History of preterm delivery; Supervision of high risk pregnancy, antepartum; Chlamydia infection affecting pregnancy in first trimester; Headache in pregnancy, antepartum, second trimester; History of incompetent cervix, currently pregnant; and Cervical shortening during pregnancy, antepartum on her problem list.  Patient reports no complaints.   Contractions: Not present. Vag. Bleeding: None.  Movement: Present. Denies leaking of fluid.   The following portions of the patient's history were reviewed and updated as appropriate: allergies, current medications, past family history, past medical history, past social history, past surgical history and problem list. Problem list updated.  Objective:   Vitals:   10/14/16 1352  BP: 101/68  Pulse: 96  Weight: 131 lb 8 oz (59.6 kg)    Fetal Status: Fetal Heart Rate (bpm): 143 Fundal Height: 31 cm Movement: Present     General:  Alert, oriented and cooperative. Patient is in no acute distress.  Skin: Skin is warm and dry. No rash noted.   Cardiovascular: Normal heart rate noted  Respiratory: Normal respiratory effort, no problems with respiration noted  Abdomen: Soft, gravid, appropriate for gestational age. Pain/Pressure: Present     Pelvic:  Cervical exam deferred        Extremities: Normal range of motion.  Edema: Trace  Mental Status: Normal mood and affect. Normal behavior. Normal judgment and thought content.   Urinalysis:      Assessment and Plan:  Pregnancy: G3P0201 at [redacted]w[redacted]d  1. Supervision of high risk pregnancy, antepartum Routine care  2. History of incompetent cervix, currently pregnant 17p today. Rescue cerclage in place  Preterm labor symptoms and  general obstetric precautions including but not limited to vaginal bleeding, contractions, leaking of fluid and fetal movement were reviewed in detail with the patient. Please refer to After Visit Summary for other counseling recommendations.  Return in about 1 week (around 10/21/2016) for 17p and 2wk rob.   [place] Bing, MD

## 2016-10-20 ENCOUNTER — Ambulatory Visit (INDEPENDENT_AMBULATORY_CARE_PROVIDER_SITE_OTHER): Payer: 59 | Admitting: *Deleted

## 2016-10-20 VITALS — BP 116/73 | HR 79

## 2016-10-20 DIAGNOSIS — O0990 Supervision of high risk pregnancy, unspecified, unspecified trimester: Secondary | ICD-10-CM

## 2016-10-20 DIAGNOSIS — O09213 Supervision of pregnancy with history of pre-term labor, third trimester: Secondary | ICD-10-CM

## 2016-10-20 DIAGNOSIS — Z8751 Personal history of pre-term labor: Secondary | ICD-10-CM

## 2016-10-28 ENCOUNTER — Ambulatory Visit (INDEPENDENT_AMBULATORY_CARE_PROVIDER_SITE_OTHER): Payer: 59 | Admitting: Obstetrics and Gynecology

## 2016-10-28 ENCOUNTER — Encounter: Payer: Self-pay | Admitting: Obstetrics and Gynecology

## 2016-10-28 VITALS — BP 116/72 | HR 79 | Wt 131.0 lb

## 2016-10-28 DIAGNOSIS — O0990 Supervision of high risk pregnancy, unspecified, unspecified trimester: Secondary | ICD-10-CM

## 2016-10-28 DIAGNOSIS — O09293 Supervision of pregnancy with other poor reproductive or obstetric history, third trimester: Secondary | ICD-10-CM

## 2016-10-28 DIAGNOSIS — O26873 Cervical shortening, third trimester: Secondary | ICD-10-CM

## 2016-10-28 DIAGNOSIS — O26879 Cervical shortening, unspecified trimester: Secondary | ICD-10-CM

## 2016-10-28 DIAGNOSIS — Z8751 Personal history of pre-term labor: Secondary | ICD-10-CM

## 2016-10-28 DIAGNOSIS — O0993 Supervision of high risk pregnancy, unspecified, third trimester: Secondary | ICD-10-CM

## 2016-10-28 DIAGNOSIS — O09299 Supervision of pregnancy with other poor reproductive or obstetric history, unspecified trimester: Secondary | ICD-10-CM

## 2016-10-28 NOTE — Progress Notes (Signed)
Subjective:  [name] is a 26 y.o. G3P0201 at 5369w2d being seen today for ongoing prenatal care.  She is currently monitored for the following issues for this high-risk pregnancy and has History of preterm delivery; Supervision of high risk pregnancy, antepartum; Headache in pregnancy, antepartum, second trimester; History of incompetent cervix, currently pregnant; and Cervical shortening during pregnancy, antepartum on her problem list.  Patient reports ext vaginal irritation. Thinks related to laundry detergent.  Contractions: Not present. Vag. Bleeding: None.  Movement: Present. Denies leaking of fluid.   The following portions of the patient's history were reviewed and updated as appropriate: allergies, current medications, past family history, past medical history, past social history, past surgical history and problem list. Problem list updated.  Objective:   Vitals:   10/28/16 1404  BP: 116/72  Pulse: 79  Weight: 131 lb (59.4 kg)    Fetal Status: Fetal Heart Rate (bpm): 140   Movement: Present     General:  Alert, oriented and cooperative. Patient is in no acute distress.  Skin: Skin is warm and dry. No rash noted.   Cardiovascular: Normal heart rate noted  Respiratory: Normal respiratory effort, no problems with respiration noted  Abdomen: Soft, gravid, appropriate for gestational age. Pain/Pressure: Present     Pelvic:  Cervical exam deferred        Extremities: Normal range of motion.  Edema: Trace  Mental Status: Normal mood and affect. Normal behavior. Normal judgment and thought content.   Urinalysis:      Assessment and Plan:  Pregnancy: G3P0201 at 6969w2d  1. History of preterm delivery Stable Continue with weekly 17 OHP  2. Supervision of high risk pregnancy, antepartum Stable Advised to d/c laundry detergent.   3. Cervical shortening during pregnancy, antepartum Stable Rescue cerclage in place  4. History of incompetent cervix, currently  pregnant Stable Rescue cerclage in place  Preterm labor symptoms and general obstetric precautions including but not limited to vaginal bleeding, contractions, leaking of fluid and fetal movement were reviewed in detail with the patient. Please refer to After Visit Summary for other counseling recommendations.  Return in about 2 weeks (around 11/11/2016) for OB visit.   Hermina StaggersErvin, Michael L, MD

## 2016-11-04 ENCOUNTER — Ambulatory Visit (INDEPENDENT_AMBULATORY_CARE_PROVIDER_SITE_OTHER): Payer: 59

## 2016-11-04 DIAGNOSIS — O09212 Supervision of pregnancy with history of pre-term labor, second trimester: Principal | ICD-10-CM

## 2016-11-04 DIAGNOSIS — O09213 Supervision of pregnancy with history of pre-term labor, third trimester: Secondary | ICD-10-CM

## 2016-11-04 DIAGNOSIS — O09892 Supervision of other high risk pregnancies, second trimester: Secondary | ICD-10-CM

## 2016-11-04 NOTE — Progress Notes (Signed)
Patient presented to the office today for 17-p injection. Patient tolerated well and will follow up next week.

## 2016-11-05 ENCOUNTER — Inpatient Hospital Stay (HOSPITAL_COMMUNITY)
Admission: AD | Admit: 2016-11-05 | Discharge: 2016-11-05 | Disposition: A | Discharge status: Home / Self Care | Payer: 59 | Source: Ambulatory Visit | Attending: Obstetrics and Gynecology | Admitting: Obstetrics and Gynecology

## 2016-11-05 ENCOUNTER — Encounter (HOSPITAL_COMMUNITY): Payer: Self-pay | Admitting: *Deleted

## 2016-11-05 ENCOUNTER — Encounter (HOSPITAL_COMMUNITY): Payer: Self-pay

## 2016-11-05 ENCOUNTER — Emergency Department (HOSPITAL_COMMUNITY): Admission: EM | Admit: 2016-11-05 | Payer: 59 | Source: Home / Self Care

## 2016-11-05 DIAGNOSIS — B9689 Other specified bacterial agents as the cause of diseases classified elsewhere: Secondary | ICD-10-CM | POA: Diagnosis not present

## 2016-11-05 DIAGNOSIS — Z9889 Other specified postprocedural states: Secondary | ICD-10-CM | POA: Insufficient documentation

## 2016-11-05 DIAGNOSIS — Z3A34 34 weeks gestation of pregnancy: Secondary | ICD-10-CM | POA: Insufficient documentation

## 2016-11-05 DIAGNOSIS — Z8249 Family history of ischemic heart disease and other diseases of the circulatory system: Secondary | ICD-10-CM | POA: Insufficient documentation

## 2016-11-05 DIAGNOSIS — O4703 False labor before 37 completed weeks of gestation, third trimester: Secondary | ICD-10-CM

## 2016-11-05 DIAGNOSIS — O26893 Other specified pregnancy related conditions, third trimester: Secondary | ICD-10-CM | POA: Diagnosis present

## 2016-11-05 DIAGNOSIS — Z803 Family history of malignant neoplasm of breast: Secondary | ICD-10-CM | POA: Diagnosis not present

## 2016-11-05 DIAGNOSIS — N760 Acute vaginitis: Secondary | ICD-10-CM | POA: Insufficient documentation

## 2016-11-05 DIAGNOSIS — Z8261 Family history of arthritis: Secondary | ICD-10-CM | POA: Insufficient documentation

## 2016-11-05 DIAGNOSIS — Z833 Family history of diabetes mellitus: Secondary | ICD-10-CM | POA: Insufficient documentation

## 2016-11-05 DIAGNOSIS — Z801 Family history of malignant neoplasm of trachea, bronchus and lung: Secondary | ICD-10-CM | POA: Insufficient documentation

## 2016-11-05 DIAGNOSIS — Z79899 Other long term (current) drug therapy: Secondary | ICD-10-CM | POA: Insufficient documentation

## 2016-11-05 DIAGNOSIS — O26899 Other specified pregnancy related conditions, unspecified trimester: Secondary | ICD-10-CM

## 2016-11-05 DIAGNOSIS — R102 Pelvic and perineal pain: Secondary | ICD-10-CM

## 2016-11-05 DIAGNOSIS — Z3689 Encounter for other specified antenatal screening: Secondary | ICD-10-CM

## 2016-11-05 LAB — URINALYSIS, ROUTINE W REFLEX MICROSCOPIC
Bilirubin Urine: NEGATIVE
GLUCOSE, UA: NEGATIVE mg/dL
Hgb urine dipstick: NEGATIVE
KETONES UR: 80 mg/dL — AB
Nitrite: NEGATIVE
PH: 5 (ref 5.0–8.0)
Protein, ur: NEGATIVE mg/dL
SPECIFIC GRAVITY, URINE: 1.024 (ref 1.005–1.030)

## 2016-11-05 LAB — WET PREP, GENITAL
SPERM: NONE SEEN
Trich, Wet Prep: NONE SEEN
Yeast Wet Prep HPF POC: NONE SEEN

## 2016-11-05 LAB — OB RESULTS CONSOLE GC/CHLAMYDIA: GC PROBE AMP, GENITAL: NEGATIVE

## 2016-11-05 MED ORDER — NIFEDIPINE 10 MG PO CAPS
10.0000 mg | ORAL_CAPSULE | ORAL | Status: AC
  Administered 2016-11-05 (×3): 10 mg via ORAL
  Filled 2016-11-05 (×3): qty 1

## 2016-11-05 MED ORDER — METRONIDAZOLE 500 MG PO TABS: 500.0000 mg | ORAL_TABLET | Freq: Two times a day (BID) | ORAL | 0 refills | Status: DC

## 2016-11-05 MED ORDER — BETAMETHASONE SOD PHOS & ACET 6 (3-3) MG/ML IJ SUSP
12.0000 mg | INTRAMUSCULAR | Status: DC
  Administered 2016-11-05: 12 mg via INTRAMUSCULAR
  Filled 2016-11-05: qty 2

## 2016-11-05 MED ORDER — LACTATED RINGERS IV BOLUS (SEPSIS)
1000.0000 mL | Freq: Once | INTRAVENOUS | Status: AC
  Administered 2016-11-05: 1000 mL via INTRAVENOUS

## 2016-11-05 NOTE — MAU Note (Signed)
Pt presents to MAU with pelvic pain that started 2 weeks ago. Pt has concerns about STD's. Pt denies vaginal bleeding and discharge. +FM

## 2016-11-05 NOTE — ED Triage Notes (Signed)
PT is states she is irritated or bump down there where she pees and it burns and she wants to be checked for std. No vaginal discharge or bleeding.  Pt is [redacted] weeks pregnant and denies abdominal pain.  She has felt baby move today.

## 2016-11-05 NOTE — ED Notes (Signed)
Called pt to re-assess vitals, pt did not answer.  

## 2016-11-05 NOTE — ED Notes (Signed)
Called pt x2 to re-assess vitals, had no answer.

## 2016-11-05 NOTE — Discharge Instructions (Signed)
Braxton Hicks Contractions °Contractions of the uterus can occur throughout pregnancy, but they are not always a sign that you are in labor. You may have practice contractions called Braxton Hicks contractions. These false labor contractions are sometimes confused with true labor. °What are Braxton Hicks contractions? °Braxton Hicks contractions are tightening movements that occur in the muscles of the uterus before labor. Unlike true labor contractions, these contractions do not result in opening (dilation) and thinning of the cervix. Toward the end of pregnancy (32-34 weeks), Braxton Hicks contractions can happen more often and may become stronger. These contractions are sometimes difficult to tell apart from true labor because they can be very uncomfortable. You should not feel embarrassed if you go to the hospital with false labor. °Sometimes, the only way to tell if you are in true labor is for your health care provider to look for changes in the cervix. The health care provider will do a physical exam and may monitor your contractions. If you are not in true labor, the exam should show that your cervix is not dilating and your water has not broken. °If there are no prenatal problems or other health problems associated with your pregnancy, it is completely safe for you to be sent home with false labor. You may continue to have Braxton Hicks contractions until you go into true labor. °How can I tell the difference between true labor and false labor? °· Differences °? False labor °? Contractions last 30-70 seconds.: Contractions are usually shorter and not as strong as true labor contractions. °? Contractions become very regular.: Contractions are usually irregular. °? Discomfort is usually felt in the top of the uterus, and it spreads to the lower abdomen and low back.: Contractions are often felt in the front of the lower abdomen and in the groin. °? Contractions do not go away with walking.: Contractions may  go away when you walk around or change positions while lying down. °? Contractions usually become more intense and increase in frequency.: Contractions get weaker and are shorter-lasting as time goes on. °? The cervix dilates and gets thinner.: The cervix usually does not dilate or become thin. °Follow these instructions at home: °· Take over-the-counter and prescription medicines only as told by your health care provider. °· Keep up with your usual exercises and follow other instructions from your health care provider. °· Eat and drink lightly if you think you are going into labor. °· If Braxton Hicks contractions are making you uncomfortable: °? Change your position from lying down or resting to walking, or change from walking to resting. °? Sit and rest in a tub of warm water. °? Drink enough fluid to keep your urine clear or pale yellow. Dehydration may cause these contractions. °? Do slow and deep breathing several times an hour. °· Keep all follow-up prenatal visits as told by your health care provider. This is important. °Contact a health care provider if: °· You have a fever. °· You have continuous pain in your abdomen. °Get help right away if: °· Your contractions become stronger, more regular, and closer together. °· You have fluid leaking or gushing from your vagina. °· You pass blood-tinged mucus (bloody show). °· You have bleeding from your vagina. °· You have low back pain that you never had before. °· You feel your baby’s head pushing down and causing pelvic pressure. °· Your baby is not moving inside you as much as it used to. °Summary °· Contractions that occur before labor are   called Braxton Hicks contractions, false labor, or practice contractions.  Braxton Hicks contractions are usually shorter, weaker, farther apart, and less regular than true labor contractions. True labor contractions usually become progressively stronger and regular and they become more frequent.  Manage discomfort from  [facility] contractions by changing position, resting in a warm bath, drinking plenty of water, or practicing deep breathing. This information is not intended to replace advice given to you by your health care provider. Make sure you discuss any questions you have with your health care provider. Document Released: 02/10/2005 Document Revised: 12/31/2015 Document Reviewed: 12/31/2015 Elsevier Interactive Patient Education  2017 [employer] Bacterial Vaginosis Bacterial vaginosis is an infection of the vagina. It happens when too many germs (bacteria) grow in the vagina. This infection puts you at risk for infections from sex (STIs). Treating this infection can lower your risk for some STIs. You should also treat this if you are pregnant. It can cause your baby to be born early. Follow these instructions at home: Medicines  Take over-the-counter and prescription medicines only as told by your doctor.  Take or use your antibiotic medicine as told by your doctor. Do not stop taking or using it even if you start to feel better. General instructions  If you your sexual partner is a woman, tell her that you have this infection. She needs to get treatment if she has symptoms. If you have a female partner, he does not need to be treated.  During treatment: ? Avoid sex. ? Do not douche. ? Avoid alcohol as told. ? Avoid breastfeeding as told.  Drink enough fluid to keep your pee (urine) clear or pale yellow.  Keep your vagina and butt (rectum) clean. ? Wash the area with warm water every day. ? Wipe from front to back after you use the toilet.  Keep all follow-up visits as told by your doctor. This is important. Preventing this condition  Do not douche.  Use only warm water to wash around your vagina.  Use protection when you have sex. This includes: ? Latex condoms. ? Dental dams.  Limit how many people you have sex with. It is best to only have sex with the same person (be  monogamous).  Get tested for STIs. Have your partner get tested.  Wear underwear that is cotton or lined with cotton.  Avoid tight pants and pantyhose. This is most important in summer.  Do not use any products that have nicotine or tobacco in them. These include cigarettes and e-cigarettes. If you need help quitting, ask your doctor.  Do not use illegal drugs.  Limit how much alcohol you drink. Contact a doctor if:  Your symptoms do not get better, even after you are treated.  You have more discharge or pain when you pee (urinate).  You have a fever.  You have pain in your belly (abdomen).  You have pain with sex.  Your bleed from your vagina between periods. Summary  This infection happens when too many germs (bacteria) grow in the vagina.  Treating this condition can lower your risk for some infections from sex (STIs).  You should also treat this if you are pregnant. It can cause early (premature) birth.  Do not stop taking or using your antibiotic medicine even if you start to feel better. This information is not intended to replace advice given to you by your health care provider. Make sure you discuss any questions you have with your health care provider. Document Released:  11/20/2007 Document Revised: 10/27/2015 Document Reviewed: 10/27/2015 Elsevier Interactive Patient Education  2017 [employer]

## 2016-11-05 NOTE — ED Notes (Addendum)
Unable to move pt from waiting room or discharge pt from [facility] due to message stating that [facility] is [facility].  Pt is not in waiting room.

## 2016-11-05 NOTE — MAU Provider Note (Signed)
History     CSN: [id number]  Arrival date and time: 11/05/16 2015   First Provider Initiated Contact with Patient 11/05/16 2126      Chief Complaint  Patient presents with  . Pelvic Pain   G3P0201 .3 weeks here with pelvic pressure. Sx started about 2-3 weeks ago and getting worse. Sx worsen when she sits up or turns over in bed. No VB or LOF. Denies ctx but feels abdomen tighten. Recent IC yesterday. Reports good FM. She also requests STD screen. No known exposure. No new partner.    OB History    Gravida Para Term Preterm AB Living   3 2 0 2 0 1   SAB TAB Ectopic Multiple Live Births   0 0 0 0 1      Past Medical History:  Diagnosis Date  . Chlamydia Feb 2017  . H/O varicella   . Headache in pregnancy, antepartum, second trimester 06/25/2016  . Premature dilatation of cervix during pregnancy 10/01/2011   5/90%/-1  . Preterm delivery 12/04/2011   SVD at [redacted]w[redacted]d on 10/22/11  . Preterm labor     Past Surgical History:  Procedure Laterality Date  . CERVICAL CERCLAGE N/A 08/09/2016   Procedure: CERCLAGE CERVICAL;  Surgeon: Catalina Antigua, MD;  Location: [facility];  Service: Gynecology;  Laterality: N/A;    Family History  Problem Relation Age of Onset  . Hypertension Mother   . Diabetes Brother   . Cancer Maternal Grandmother        LUNG  . Diabetes Maternal Grandfather   . Arthritis Paternal Grandmother   . Cancer Paternal Grandmother        BREAST  . Diabetes Maternal Uncle     Social History  Substance Use Topics  . Smoking status: Never Smoker  . Smokeless tobacco: Never Used  . Alcohol use No    Allergies: No Known Allergies  Facility-Administered Medications Prior to Admission  Medication Dose Route Frequency Provider Last Rate Last Dose  . hydroxyprogesterone caproate (MAKENA) 250 mg/mL injection 250 mg  250 mg Intramuscular Weekly Constant, Peggy, MD   250 mg at 10/28/16 1414  . HYDROXYprogesterone Caproate SOAJ 275 mg  275 mg Subcutaneous Weekly  Constant, Peggy, MD   275 mg at 07/24/16 1315   Prescriptions Prior to Admission  Medication Sig Dispense Refill Last Dose  . acetaminophen (TYLENOL) 500 MG tablet Take 500 mg by mouth every 6 (six) hours as needed.   Not Taking  . Prenatal Vit-Fe Fumarate-FA (PRENATAL MULTIVITAMIN) TABS tablet Take 1 tablet by mouth daily at 12 noon. 30 tablet 0 Taking    Review of Systems  Constitutional: Negative for fever.  Gastrointestinal: Negative for abdominal pain, constipation and diarrhea.  Genitourinary: Positive for pelvic pain. Negative for dysuria, vaginal bleeding and vaginal discharge.   Physical Exam   Blood pressure 124/79, pulse 87, temperature 98.2 F (36.8 C), temperature source Oral, resp. rate 18, last menstrual period 03/24/2016, unknown if currently breastfeeding.  Physical Exam  Constitutional: She is oriented to person, place, and time. She appears well-developed and well-nourished.  HENT:  Head: Normocephalic and atraumatic.  Neck: Normal range of motion.  Cardiovascular: Normal rate.   Respiratory: Effort normal.  GI: Soft. She exhibits no distension. There is no tenderness.  gravid  Genitourinary:  Genitourinary Comments: External: no lesions or erythema Vagina: rugated, pink, moist, thin white discharge, cerclage in place, visually closed Cervix 1.5/60/-2, vtx    Musculoskeletal: Normal range of motion.  Neurological: She is  alert and oriented to person, place, and time.  Skin: Skin is warm and dry.  Psychiatric: She has a normal mood and affect.   EFM: 135 bpm, mod variability, + accels, no decels Toco: 2-3 Results for orders placed or performed during the hospital encounter of 11/05/16 (from the past 24 hour(s))  Urinalysis, Routine w reflex microscopic     Status: Abnormal   Collection Time: 11/05/16  8:50 PM  Result Value Ref Range   Color, Urine YELLOW YELLOW   APPearance CLEAR CLEAR   Specific Gravity, Urine 1.024 1.005 - 1.030   pH 5.0 5.0 - 8.0    Glucose, UA NEGATIVE NEGATIVE mg/dL   Hgb urine dipstick NEGATIVE NEGATIVE   Bilirubin Urine NEGATIVE NEGATIVE   Ketones, ur 80 (A) NEGATIVE mg/dL   Protein, ur NEGATIVE NEGATIVE mg/dL   Nitrite NEGATIVE NEGATIVE   Leukocytes, UA TRACE (A) NEGATIVE   RBC / HPF 0-5 0 - 5 RBC/hpf   WBC, UA 0-5 0 - 5 WBC/hpf   Bacteria, UA RARE (A) NONE SEEN   Squamous Epithelial / LPF 0-5 (A) NONE SEEN   Mucus PRESENT   Wet prep, genital     Status: Abnormal   Collection Time: 11/05/16  9:30 PM  Result Value Ref Range   Yeast Wet Prep HPF POC NONE SEEN NONE SEEN   Trich, Wet Prep NONE SEEN NONE SEEN   Clue Cells Wet Prep HPF POC PRESENT (A) NONE SEEN   WBC, Wet Prep HPF POC MODERATE (A) NONE SEEN   Sperm NONE SEEN    MAU Course  Procedures LR 1 L Procardia x3 doses Betamethasone   MDM Labs ordered and reviewed. Ctx decreased after Procardia. No cervical change. Will treat BV. Stable for discharge home.   Assessment and Plan   1. [redacted] weeks gestation of pregnancy   2. NST (non-stress test) reactive   3. Preterm uterine contractions in third trimester, antepartum   4. Pelvic pressure in pregnancy   5. Bacterial vaginosis    Discharge home Rx Flagyl Follow up in [facility] as scheduled Return to [facility]U tomorrow night for BMZ #2 PTL precautions Pelvic rest  Allergies as of 11/05/2016   No Known Allergies     Medication List    TAKE these medications   acetaminophen 500 MG tablet Commonly known as:  TYLENOL Take 500 mg by mouth every 6 (six) hours as needed.   metroNIDAZOLE 500 MG tablet Commonly known as:  FLAGYL Take 1 tablet (500 mg total) by mouth 2 (two) times daily.   prenatal multivitamin Tabs tablet Take 1 tablet by mouth daily at 12 noon.            Discharge Care Instructions        Start     Ordered   11/05/16 0000  Discharge patient    Question Answer Comment  Discharge disposition 01-Home or Self Care   Discharge patient date 11/05/2016      11/05/16 2323    11/05/16 0000  metroNIDAZOLE (FLAGYL) 500 MG tablet  2 times daily    Question:  Supervising Provider  Answer:  Tilda BurrowFERGUSON, JOHN V   11/05/16 2323     Donette LarryMelanie Berdell Hostetler, CNM 11/05/2016, 9:35 PM

## 2016-11-06 LAB — GC/CHLAMYDIA PROBE AMP (~~LOC~~) NOT AT ARMC
Chlamydia: NEGATIVE
NEISSERIA GONORRHEA: NEGATIVE

## 2016-11-09 ENCOUNTER — Inpatient Hospital Stay (HOSPITAL_COMMUNITY)
Admission: AD | Admit: 2016-11-09 | Discharge: 2016-11-09 | Disposition: A | Discharge status: Home / Self Care | Payer: 59 | Source: Ambulatory Visit | Attending: Obstetrics & Gynecology | Admitting: Obstetrics & Gynecology

## 2016-11-09 ENCOUNTER — Encounter (HOSPITAL_COMMUNITY): Payer: Self-pay

## 2016-11-09 DIAGNOSIS — Z801 Family history of malignant neoplasm of trachea, bronchus and lung: Secondary | ICD-10-CM | POA: Insufficient documentation

## 2016-11-09 DIAGNOSIS — O4703 False labor before 37 completed weeks of gestation, third trimester: Secondary | ICD-10-CM | POA: Diagnosis not present

## 2016-11-09 DIAGNOSIS — Z833 Family history of diabetes mellitus: Secondary | ICD-10-CM | POA: Insufficient documentation

## 2016-11-09 DIAGNOSIS — Z8759 Personal history of other complications of pregnancy, childbirth and the puerperium: Secondary | ICD-10-CM | POA: Insufficient documentation

## 2016-11-09 DIAGNOSIS — Z8261 Family history of arthritis: Secondary | ICD-10-CM | POA: Diagnosis not present

## 2016-11-09 DIAGNOSIS — Z8249 Family history of ischemic heart disease and other diseases of the circulatory system: Secondary | ICD-10-CM | POA: Insufficient documentation

## 2016-11-09 DIAGNOSIS — R109 Unspecified abdominal pain: Secondary | ICD-10-CM | POA: Diagnosis present

## 2016-11-09 DIAGNOSIS — Z8619 Personal history of other infectious and parasitic diseases: Secondary | ICD-10-CM | POA: Insufficient documentation

## 2016-11-09 DIAGNOSIS — Z3A35 35 weeks gestation of pregnancy: Secondary | ICD-10-CM | POA: Diagnosis not present

## 2016-11-09 DIAGNOSIS — O3433 Maternal care for cervical incompetence, third trimester: Secondary | ICD-10-CM | POA: Diagnosis not present

## 2016-11-09 DIAGNOSIS — Z803 Family history of malignant neoplasm of breast: Secondary | ICD-10-CM | POA: Insufficient documentation

## 2016-11-09 DIAGNOSIS — R102 Pelvic and perineal pain: Secondary | ICD-10-CM | POA: Diagnosis present

## 2016-11-09 LAB — URINALYSIS, ROUTINE W REFLEX MICROSCOPIC
BILIRUBIN URINE: NEGATIVE
Bacteria, UA: NONE SEEN
Glucose, UA: NEGATIVE mg/dL
HGB URINE DIPSTICK: NEGATIVE
Ketones, ur: NEGATIVE mg/dL
Nitrite: NEGATIVE
Protein, ur: NEGATIVE mg/dL
SPECIFIC GRAVITY, URINE: 1.026 (ref 1.005–1.030)
pH: 6 (ref 5.0–8.0)

## 2016-11-09 MED ORDER — CYCLOBENZAPRINE HCL 10 MG PO TABS: 10.0000 mg | ORAL_TABLET | Freq: Three times a day (TID) | ORAL | 2 refills | Status: DC | PRN

## 2016-11-09 MED ORDER — CYCLOBENZAPRINE HCL 10 MG PO TABS
10.0000 mg | ORAL_TABLET | Freq: Once | ORAL | Status: AC
  Administered 2016-11-09: 10 mg via ORAL
  Filled 2016-11-09: qty 1

## 2016-11-09 NOTE — MAU Provider Note (Signed)
Chief Complaint:  Abdominal Pain and Pelvic Pressure   None     HPI: Sherri Shepherd is a 26 y.o. G3P0201 at [redacted]w[redacted]d with hx of 20 week previable delivery and cerclage this pregnancy who presents to maternity admissions reporting constant pelvic pressure that started weeks ago but became stronger last night.  She initially denies any intermittent pain but her s/o reports that she appears to have pain that comes and goes and the pt agreed that there is some intermittent abdominal cramping pain in addition to the pressure.  She is unsure of the timing of this cramping pain.  She denies LOF or vaginal bleeding. There are no other associated symptoms.  She reports good fetal movement, denies vaginal itching/burning, urinary symptoms, h/a, dizziness, n/v, or fever/chills.    HPI  Past Medical History: Past Medical History:  Diagnosis Date  . Chlamydia Feb 2017  . H/O varicella   . Headache in pregnancy, antepartum, second trimester 06/25/2016  . Premature dilatation of cervix during pregnancy 10/01/2011   5/90%/-1  . Preterm delivery 12/04/2011   SVD at [redacted]w[redacted]d on 10/22/11  . Preterm labor     Past obstetric history: OB History  Gravida Para Term Preterm AB Living  3 2 0 2 0 1  SAB TAB Ectopic Multiple Live Births  0 0 0 0 1    # Outcome Date GA Lbr Len/2nd Weight Sex Delivery Anes PTL Lv  3 Current           2 Preterm 03/14/15 [redacted]w[redacted]d  10 oz (0.283 kg) M Vag-Spont None  FD  1 Preterm 10/22/11 [redacted]w[redacted]d 11:10 / 03:30 5 lb 12.4 oz (2.62 kg) M Vag-Spont EPI  LIV     Birth Comments: none      Past Surgical History: Past Surgical History:  Procedure Laterality Date  . CERVICAL CERCLAGE N/A 08/09/2016   Procedure: CERCLAGE CERVICAL;  Surgeon: Catalina Antigua, MD;  Location: [facility];  Service: Gynecology;  Laterality: N/A;    Family History: Family History  Problem Relation Age of Onset  . Hypertension Mother   . Diabetes Brother   . Cancer Maternal Grandmother        LUNG  . Diabetes  Maternal Grandfather   . Arthritis Paternal Grandmother   . Cancer Paternal Grandmother        BREAST  . Diabetes Maternal Uncle     Social History: Social History  Substance Use Topics  . Smoking status: Never Smoker  . Smokeless tobacco: Never Used  . Alcohol use No    Allergies: No Known Allergies  Meds:  No prescriptions prior to admission.    ROS:  Review of Systems  Constitutional: Negative for chills, fatigue and fever.  Eyes: Negative for visual disturbance.  Respiratory: Negative for shortness of breath.   Cardiovascular: Negative for chest pain.  Gastrointestinal: Positive for abdominal pain. Negative for nausea and vomiting.  Genitourinary: Positive for pelvic pain. Negative for difficulty urinating, dysuria, flank pain, vaginal bleeding, vaginal discharge and vaginal pain.  Musculoskeletal: Negative for back pain.  Neurological: Negative for dizziness and headaches.  Psychiatric/Behavioral: Negative.      I have reviewed patient's Past Medical Hx, Surgical Hx, Family Hx, Social Hx, medications and allergies.   Physical Exam   Patient Vitals for the past 24 hrs:  BP Temp Temp src Pulse Resp  11/09/16 1053 104/63 - - - -  11/09/16 0839 112/74 97.8 F (36.6 C) Oral 87 16   Constitutional: Well-developed, well-nourished female in  no acute distress.  Cardiovascular: normal rate Respiratory: normal effort GI: Abd soft, non-tender, gravid appropriate for gestational age.  MS: Extremities nontender, no edema, normal ROM Neurologic: Alert and oriented x 4.  GU: Neg CVAT.  PELVIC EXAM: Cervix pink, visually closed, without lesion, scant white creamy discharge, vaginal walls and external genitalia normal Bimanual exam: Cervix 0/long/high, firm, anterior, neg CMT, uterus nontender, nonenlarged, adnexa without tenderness, enlargement, or mass  Cervix 2/50/-2 posterior, vertex by Misty Stanley Leftwich-Kirby,CNM  Dilation: 2 Effacement (%): 50 Cervical Position:  Posterior Presentation: Vertex Exam by:: dr Erin Fulling  FHT:  Baseline 135 , moderate variability, accelerations present, no decelerations Contractions: q 7-9 mins, irregular, mild to palpation   Labs: Results for orders placed or performed during the hospital encounter of 11/09/16 (from the past 24 hour(s))  Urinalysis, Routine w reflex microscopic     Status: Abnormal   Collection Time: 11/09/16  9:08 AM  Result Value Ref Range   Color, Urine YELLOW YELLOW   APPearance CLEAR CLEAR   Specific Gravity, Urine 1.026 1.005 - 1.030   pH 6.0 5.0 - 8.0   Glucose, UA NEGATIVE NEGATIVE mg/dL   Hgb urine dipstick NEGATIVE NEGATIVE   Bilirubin Urine NEGATIVE NEGATIVE   Ketones, ur NEGATIVE NEGATIVE mg/dL   Protein, ur NEGATIVE NEGATIVE mg/dL   Nitrite NEGATIVE NEGATIVE   Leukocytes, UA TRACE (A) NEGATIVE   RBC / HPF 0-5 0 - 5 RBC/hpf   WBC, UA 0-5 0 - 5 WBC/hpf   Bacteria, UA NONE SEEN NONE SEEN   Squamous Epithelial / LPF 0-5 (A) NONE SEEN   Mucus PRESENT    Hyaline Casts, UA PRESENT    O/Positive/-- (04/12 1610)  Imaging:  No results found.  MAU Course/MDM: I have ordered labs and reviewed results.  NST reviewed and reactive No evidence of labor, cervix reexamined and no change in 1 hour in MAU Cerclage in place and no tension noted on cerclage Flexeril 10 mg PO x 1 dose in MAU with some improvement in pain Rx for Flexeril 10 mg TID PRN D/C home with preterm labor precautions  Pt stable at time of discharge.  Assessment: 1. Preterm uterine contractions in third trimester, antepartum     Plan: Discharge home Labor precautions and fetal kick counts  Follow-up Information    [facility] Follow up.   Why:  9/18with Dr.Anyanwu as scheduled Contact information: [address] [place] [address] [phone]         Allergies as of 11/09/2016   No Known Allergies     Medication List    TAKE these medications   acetaminophen 500  MG tablet Commonly known as:  TYLENOL Take 1,000 mg by mouth every 6 (six) hours as needed for headache.   cyclobenzaprine 10 MG tablet Commonly known as:  FLEXERIL Take 1 tablet (10 mg total) by mouth 3 (three) times daily as needed for muscle spasms.   metroNIDAZOLE 500 MG tablet Commonly known as:  FLAGYL Take 1 tablet (500 mg total) by mouth 2 (two) times daily.   prenatal multivitamin Tabs tablet Take 1 tablet by mouth daily at 12 noon.            Discharge Care Instructions        Start     Ordered   11/09/16 0000  PRETERM LABOR:  Includes any of the follwing symptoms that occur between 20 - [redacted] weeks gestation.  If these symptoms are not stopped, preterm labor can result  in preterm delivery, placing your baby at risk  (Preterm Labor Notify MD)     11/09/16 1046   11/09/16 0000  Notify physician for menstrual like cramps  (Preterm Labor Notify MD)     11/09/16 1046   11/09/16 0000  Notify physician for uterine contractions.  These may be painless and feel like the uterus is tightening or the baby is  "balling up"  (Preterm Labor Notify MD)     11/09/16 1046   11/09/16 0000  Notify physician for low, dull backache, unrelieved by heat or Tylenol  (Preterm Labor Notify MD)     11/09/16 1046   11/09/16 0000  Notify physician for intestinal cramps, with or without diarrhea, sometimes described as "gas pain"  (Preterm Labor Notify MD)     11/09/16 1046   11/09/16 0000  Notify physician for pelvic pressure  (Preterm Labor Notify MD)     11/09/16 1046   11/09/16 0000  Notify physician for increase or change in vaginal discharge  (Preterm Labor Notify MD)     11/09/16 1046   11/09/16 0000  Notify physician for vaginal bleeding  (Preterm Labor Notify MD)     11/09/16 1046   11/09/16 0000  Notify physician for a general feeling that "something is not right"  (Preterm Labor Notify MD)     11/09/16 1046   11/09/16 0000  Notify physician for leaking of fluid  (Preterm Labor Notify  MD)     11/09/16 1046   11/09/16 0000  Discharge activity:  No Restrictions     11/09/16 1046   11/09/16 0000  Do not have sex or do anything that might make you have an orgasm     11/09/16 1046   11/09/16 0000  Discharge diet:  No restrictions     11/09/16 1046   11/09/16 0000  cyclobenzaprine (FLEXERIL) 10 MG tablet  3 times daily PRN     11/09/16 1046      [employer] Certified Nurse-Midwife 11/09/2016 11:22 AM

## 2016-11-09 NOTE — Discharge Instructions (Signed)

## 2016-11-09 NOTE — MAU Note (Signed)
Reports increased pelvic pressure since 7PM. Feels like the baby is trying to crawl out. Was seen a couple weeks for increased pelvic pressure but she reports today it is much worse. No vaginal bleeding, no LOF. +fetal movement

## 2016-11-11 ENCOUNTER — Ambulatory Visit (INDEPENDENT_AMBULATORY_CARE_PROVIDER_SITE_OTHER): Payer: 59 | Admitting: Obstetrics & Gynecology

## 2016-11-11 ENCOUNTER — Encounter: Payer: Self-pay | Admitting: Obstetrics & Gynecology

## 2016-11-11 VITALS — BP 121/74 | HR 97 | Wt 133.0 lb

## 2016-11-11 DIAGNOSIS — Z23 Encounter for immunization: Secondary | ICD-10-CM | POA: Diagnosis not present

## 2016-11-11 DIAGNOSIS — O0993 Supervision of high risk pregnancy, unspecified, third trimester: Secondary | ICD-10-CM

## 2016-11-11 DIAGNOSIS — O0990 Supervision of high risk pregnancy, unspecified, unspecified trimester: Secondary | ICD-10-CM

## 2016-11-11 DIAGNOSIS — O09293 Supervision of pregnancy with other poor reproductive or obstetric history, third trimester: Secondary | ICD-10-CM

## 2016-11-11 DIAGNOSIS — O365930 Maternal care for other known or suspected poor fetal growth, third trimester, not applicable or unspecified: Secondary | ICD-10-CM

## 2016-11-11 DIAGNOSIS — O09299 Supervision of pregnancy with other poor reproductive or obstetric history, unspecified trimester: Secondary | ICD-10-CM

## 2016-11-11 DIAGNOSIS — O26873 Cervical shortening, third trimester: Secondary | ICD-10-CM

## 2016-11-11 DIAGNOSIS — O26879 Cervical shortening, unspecified trimester: Secondary | ICD-10-CM

## 2016-11-11 DIAGNOSIS — Z8751 Personal history of pre-term labor: Secondary | ICD-10-CM

## 2016-11-11 DIAGNOSIS — O09213 Supervision of pregnancy with history of pre-term labor, third trimester: Secondary | ICD-10-CM

## 2016-11-11 LAB — OB RESULTS CONSOLE GBS: GBS: NEGATIVE

## 2016-11-11 NOTE — Progress Notes (Signed)
   PRENATAL VISIT NOTE  Subjective:  Sherri Shepherd is a 26 y.o. G3P0201 at [redacted]w[redacted]d being seen today for ongoing prenatal care.  She is currently monitored for the following issues for this high-risk pregnancy and has History of preterm delivery; Supervision of high risk pregnancy, antepartum; Headache in pregnancy, antepartum, second trimester; History of incompetent cervix, currently pregnant; and Cervical shortening during pregnancy, antepartum on her problem list.  Patient reports occasional contractions; was 2 cm dilated at last check.  Contractions: Irregular. Vag. Bleeding: None.  Movement: Present. Denies leaking of fluid.   The following portions of the patient's history were reviewed and updated as appropriate: allergies, current medications, past family history, past medical history, past social history, past surgical history and problem list. Problem list updated.  Objective:   Vitals:   11/11/16 1432  BP: 121/74  Pulse: 97  Weight: 133 lb (60.3 kg)    Fetal Status: Fetal Heart Rate (bpm): 148 Fundal Height: 30 cm Movement: Present     General:  Alert, oriented and cooperative. Patient is in no acute distress.  Skin: Skin is warm and dry. No rash noted.   Cardiovascular: Normal heart rate noted  Respiratory: Normal respiratory effort, no problems with respiration noted  Abdomen: Soft, gravid, appropriate for gestational age.  Pain/Pressure: Present     Pelvic: Cervical exam deferred Dilation: 2 Effacement (%): 50 Station: Ballotable Cerclage in place  Extremities: Normal range of motion.  Edema: None  Mental Status:  Normal mood and affect. Normal behavior. Normal judgment and thought content.   Assessment and Plan:  Pregnancy: G3P0201 at [redacted]w[redacted]d  1. History of incompetent cervix, currently pregnant 2. History of preterm delivery 3. Cervical shortening during pregnancy, antepartum Cerclage still in place, will remove in next 1-2 weeks.  Continue weekly 17P. - Flu  Vaccine QUAD 36+ mos IM (Fluarix, Quad PF) - Culture, beta strep (group b only)  4. Poor fetal growth affecting management of mother in third trimester, single or unspecified fetus Growth scan ordered - US MFM OB FOLLOW UP; Future  5. Supervision of high risk pregnancy, antepartum Preterm labor symptoms and general obstetric precautions including but not limited to vaginal bleeding, contractions, leaking of fluid and fetal movement were reviewed in detail with the patient. Please refer to After Visit Summary for other counseling recommendations.  Return in about 1 week (around 11/18/2016) for OB Visit (HOB), 17P.   Jaynie Collins, MD

## 2016-11-11 NOTE — Patient Instructions (Signed)
Return to clinic for any scheduled appointments or obstetric concerns, or go to MAU for evaluation  

## 2016-11-13 ENCOUNTER — Encounter (HOSPITAL_COMMUNITY): Payer: Self-pay | Admitting: Certified Nurse Midwife

## 2016-11-13 ENCOUNTER — Inpatient Hospital Stay (HOSPITAL_COMMUNITY)
Admission: AD | Admit: 2016-11-13 | Discharge: 2016-11-13 | Disposition: A | Discharge status: Home / Self Care | Payer: 59 | Source: Ambulatory Visit | Attending: Obstetrics and Gynecology | Admitting: Obstetrics and Gynecology

## 2016-11-13 DIAGNOSIS — O9989 Other specified diseases and conditions complicating pregnancy, childbirth and the puerperium: Secondary | ICD-10-CM | POA: Diagnosis not present

## 2016-11-13 DIAGNOSIS — Z833 Family history of diabetes mellitus: Secondary | ICD-10-CM | POA: Insufficient documentation

## 2016-11-13 DIAGNOSIS — Z3A35 35 weeks gestation of pregnancy: Secondary | ICD-10-CM

## 2016-11-13 DIAGNOSIS — Z8249 Family history of ischemic heart disease and other diseases of the circulatory system: Secondary | ICD-10-CM | POA: Diagnosis not present

## 2016-11-13 DIAGNOSIS — O26893 Other specified pregnancy related conditions, third trimester: Secondary | ICD-10-CM | POA: Diagnosis not present

## 2016-11-13 DIAGNOSIS — R0989 Other specified symptoms and signs involving the circulatory and respiratory systems: Secondary | ICD-10-CM | POA: Diagnosis present

## 2016-11-13 DIAGNOSIS — Z3689 Encounter for other specified antenatal screening: Secondary | ICD-10-CM

## 2016-11-13 NOTE — MAU Note (Signed)
Pt came in by EMS, pt states she took flagyl around 1900 last night, she feels like the pill has been stuck in her throat ever since.  Has been vomiting, feels a contraction when she vomits.  Denies bleeding or LOF.

## 2016-11-13 NOTE — Discharge Instructions (Signed)

## 2016-11-13 NOTE — MAU Provider Note (Signed)
History     CSN: [id number]  Arrival date and time: 11/13/16 1711  First Provider Initiated Contact with Patient 11/13/16 2041      Chief Complaint  Patient presents with  . pill lodged in throat   HPI Sherri Shepherd is a 26 y.o. [id number] at [redacted]w[redacted]d who presents for throat irritation. Has been taking flagyl for BV. Took dose yesterday evening at 7 pm & felt like the pill got stuck in her throat. Since then has been nauseated & "scared to eat". Denies SOB, difficulty swallowing, chest pain, abdominal pain, or cough. Vomited twice this morning. Was able to eat chips earlier today but states it was irritating to her throat.   OB History    Gravida Para Term Preterm AB Living   3 2 0 2 0 1   SAB TAB Ectopic Multiple Live Births   0 0 0 0 1      Past Medical History:  Diagnosis Date  . Chlamydia Feb 2017  . H/O varicella   . Headache in pregnancy, antepartum, second trimester 06/25/2016  . Premature dilatation of cervix during pregnancy 10/01/2011   5/90%/-1  . Preterm delivery 12/04/2011   SVD at [redacted]w[redacted]d on 10/22/11    Past Surgical History:  Procedure Laterality Date  . CERVICAL CERCLAGE N/A 08/09/2016   Procedure: CERCLAGE CERVICAL;  Surgeon: Catalina Antigua, MD;  Location: [facility];  Service: Gynecology;  Laterality: N/A;    Family History  Problem Relation Age of Onset  . Hypertension Mother   . Diabetes Brother   . Cancer Maternal Grandmother        LUNG  . Diabetes Maternal Grandfather   . Arthritis Paternal Grandmother   . Cancer Paternal Grandmother        BREAST  . Diabetes Maternal Uncle     Social History  Substance Use Topics  . Smoking status: Never Smoker  . Smokeless tobacco: Never Used  . Alcohol use No    Allergies: No Known Allergies  Facility-Administered Medications Prior to Admission  Medication Dose Route Frequency Provider Last Rate Last Dose  . hydroxyprogesterone caproate (MAKENA) 250 mg/mL injection 250 mg  250 mg Intramuscular Weekly  Constant, Peggy, MD   250 mg at 11/11/16 1447   Prescriptions Prior to Admission  Medication Sig Dispense Refill Last Dose  . metroNIDAZOLE (FLAGYL) 500 MG tablet Take 1 tablet (500 mg total) by mouth 2 (two) times daily. 14 tablet 0 11/12/2016 at Unknown time  . Prenatal Vit-Fe Fumarate-FA (PRENATAL MULTIVITAMIN) TABS tablet Take 1 tablet by mouth daily at 12 noon. 30 tablet 0 11/12/2016 at Unknown time  . acetaminophen (TYLENOL) 500 MG tablet Take 1,000 mg by mouth every 6 (six) hours as needed for headache.    Taking  . cyclobenzaprine (FLEXERIL) 10 MG tablet Take 1 tablet (10 mg total) by mouth 3 (three) times daily as needed for muscle spasms. 30 tablet 2 Taking    Review of Systems  Constitutional: Negative.   HENT: Positive for sore throat. Negative for trouble swallowing.   Respiratory: Negative for cough, choking, shortness of breath and wheezing.   Cardiovascular: Negative for chest pain.  Gastrointestinal: Negative.   Genitourinary: Negative.    Physical Exam   Blood pressure 110/77, pulse 96, temperature 98.4 F (36.9 C), temperature source Oral, resp. rate 16, last menstrual period 03/24/2016, unknown if currently breastfeeding.  Physical Exam  Nursing note and vitals reviewed. Constitutional: She is oriented to person, place, and time. She appears well-developed and  well-nourished. No distress.  HENT:  Head: Normocephalic and atraumatic.  Mouth/Throat: Oropharynx is clear and moist.  Eyes: Conjunctivae are normal. Right eye exhibits no discharge. Left eye exhibits no discharge. No scleral icterus.  Neck: Normal range of motion.  Cardiovascular: Normal rate, regular rhythm and normal heart sounds.   No murmur heard. Respiratory: Effort normal and breath sounds normal. No respiratory distress. She has no wheezes.  Neurological: She is alert and oriented to person, place, and time.  Skin: Skin is warm and dry. She is not diaphoretic.  Psychiatric: She has a normal mood  and affect. Her behavior is normal. Judgment and thought content normal.   Fetal Tracing:  Baseline: 140 Variability: moderate Accelerations: 15x15 Decelerations: none  Toco: x1 MAU Course  Procedures No results found for this or any previous visit (from the past 24 hour(s)).  MDM VSS, NAD Patient able to tolerate crackers & drink Benign exam Reactive NST  Assessment and Plan  A: 1. Foreign body sensation in throat   2. NST (non-stress test) reactive   3. [redacted] weeks gestation of pregnancy    P: Discharge home Continue meds as previously prescribed -- instructed to drink plenty of water with medication administration Discussed reasons to return to MAU Keep f/u with OB  Judeth Horn 11/13/2016, 8:41 PM

## 2016-11-15 LAB — CULTURE, BETA STREP (GROUP B ONLY): Strep Gp B Culture: NEGATIVE

## 2016-11-17 ENCOUNTER — Ambulatory Visit (INDEPENDENT_AMBULATORY_CARE_PROVIDER_SITE_OTHER): Payer: 59 | Admitting: Family Medicine

## 2016-11-17 ENCOUNTER — Other Ambulatory Visit: Payer: Self-pay | Admitting: Obstetrics & Gynecology

## 2016-11-17 ENCOUNTER — Ambulatory Visit (HOSPITAL_COMMUNITY)
Admission: RE | Admit: 2016-11-17 | Discharge: 2016-11-17 | Disposition: A | Discharge status: Home / Self Care | Payer: 59 | Source: Ambulatory Visit | Attending: Obstetrics & Gynecology | Admitting: Obstetrics & Gynecology

## 2016-11-17 VITALS — BP 115/74 | HR 98 | Wt 136.6 lb

## 2016-11-17 DIAGNOSIS — O0993 Supervision of high risk pregnancy, unspecified, third trimester: Secondary | ICD-10-CM

## 2016-11-17 DIAGNOSIS — O3433 Maternal care for cervical incompetence, third trimester: Secondary | ICD-10-CM

## 2016-11-17 DIAGNOSIS — Z3A36 36 weeks gestation of pregnancy: Secondary | ICD-10-CM | POA: Insufficient documentation

## 2016-11-17 DIAGNOSIS — O26843 Uterine size-date discrepancy, third trimester: Secondary | ICD-10-CM | POA: Insufficient documentation

## 2016-11-17 DIAGNOSIS — O09213 Supervision of pregnancy with history of pre-term labor, third trimester: Secondary | ICD-10-CM

## 2016-11-17 DIAGNOSIS — O09299 Supervision of pregnancy with other poor reproductive or obstetric history, unspecified trimester: Secondary | ICD-10-CM

## 2016-11-17 DIAGNOSIS — O09212 Supervision of pregnancy with history of pre-term labor, second trimester: Secondary | ICD-10-CM

## 2016-11-17 DIAGNOSIS — O365930 Maternal care for other known or suspected poor fetal growth, third trimester, not applicable or unspecified: Secondary | ICD-10-CM

## 2016-11-17 DIAGNOSIS — O0990 Supervision of high risk pregnancy, unspecified, unspecified trimester: Secondary | ICD-10-CM

## 2016-11-17 DIAGNOSIS — O09293 Supervision of pregnancy with other poor reproductive or obstetric history, third trimester: Secondary | ICD-10-CM

## 2016-11-17 DIAGNOSIS — Z8751 Personal history of pre-term labor: Secondary | ICD-10-CM

## 2016-11-17 DIAGNOSIS — O09892 Supervision of other high risk pregnancies, second trimester: Secondary | ICD-10-CM

## 2016-11-17 NOTE — Progress Notes (Signed)
   PRENATAL VISIT NOTE  Subjective:  Sherri Shepherd is a 26 y.o. G3P0201 at [redacted]w[redacted]d being seen today for ongoing prenatal care.  She is currently monitored for the following issues for this high-risk pregnancy and has History of preterm delivery; Supervision of high risk pregnancy, antepartum; Headache in pregnancy, antepartum, second trimester; History of incompetent cervix, currently pregnant; and Cervical shortening during pregnancy, antepartum on her problem list.  Patient reports occasional contractions.  Contractions: Irregular. Vag. Bleeding: None.  Movement: Present. Denies leaking of fluid.   The following portions of the patient's history were reviewed and updated as appropriate: allergies, current medications, past family history, past medical history, past social history, past surgical history and problem list. Problem list updated.  Objective:   Vitals:   11/17/16 1409  BP: 115/74  Pulse: 98  Weight: 136 lb 9.6 oz (62 kg)    Fetal Status: Fetal Heart Rate (bpm): 160 Fundal Height: 32 cm Movement: Present     General:  Alert, oriented and cooperative. Patient is in no acute distress.  Skin: Skin is warm and dry. No rash noted.   Cardiovascular: Normal heart rate noted  Respiratory: Normal respiratory effort, no problems with respiration noted  Abdomen: Soft, gravid, appropriate for gestational age.  Pain/Pressure: Present     Pelvic: Cervical exam deferred        Extremities: Normal range of motion.  Edema: None  Mental Status:  Normal mood and affect. Normal behavior. Normal judgment and thought content.   Assessment and Plan:  Pregnancy: G3P0201 at [redacted]w[redacted]d  1. Supervision of high risk pregnancy, antepartum FHT normal. Size/dates inconsistent - [place] today  2. History of incompetent cervix, currently pregnant Has cerclage - remove next visit  3. History of preterm delivery, currently pregnant in second trimester   4. History of preterm delivery   Preterm labor  symptoms and general obstetric precautions including but not limited to vaginal bleeding, contractions, leaking of fluid and fetal movement were reviewed in detail with the patient. Please refer to After Visit Summary for other counseling recommendations.  Return in about 1 week (around 11/24/2016).   Levie Heritage, DO

## 2016-11-18 ENCOUNTER — Ambulatory Visit: Payer: 59

## 2016-11-19 ENCOUNTER — Telehealth: Payer: Self-pay | Admitting: *Deleted

## 2016-11-19 NOTE — Telephone Encounter (Signed)
mychart message to patient. 

## 2016-11-19 NOTE — Telephone Encounter (Signed)
-----   Message from Levie Heritage, DO sent at 11/18/2016  3:44 PM EDT ----- Regarding: RE: Out of work I talked with the patient yesterday - it sounded like her back pain was due to contractions (back labor). This is not a reason to be out of work.  ----- Message ----- From: Rash, Rae Lips, LPN Sent: 1/61/0960  11:51 AM To: Levie Heritage, DO Subject: Out of work                                    Pt is requesting to be taken out of work due to back pain and she is unable to perform her job duties. Dr. Vergie Living gave her a note for light duty but patient feels that she is unable to do this. Please advise. Send answer to clinical pool.   Thanks,   Marchelle Folks

## 2016-11-20 ENCOUNTER — Inpatient Hospital Stay (HOSPITAL_COMMUNITY): Payer: 59 | Admitting: Anesthesiology

## 2016-11-20 ENCOUNTER — Encounter: Payer: Self-pay | Admitting: Family Medicine

## 2016-11-20 ENCOUNTER — Encounter (HOSPITAL_COMMUNITY): Payer: Self-pay | Admitting: *Deleted

## 2016-11-20 ENCOUNTER — Telehealth: Payer: Self-pay | Admitting: *Deleted

## 2016-11-20 ENCOUNTER — Inpatient Hospital Stay (HOSPITAL_COMMUNITY)
Admission: AD | Admit: 2016-11-20 | Discharge: 2016-11-22 | DRG: 765 | Disposition: A | Discharge status: Home / Self Care | Payer: 59 | Source: Ambulatory Visit | Attending: Obstetrics & Gynecology | Admitting: Obstetrics & Gynecology

## 2016-11-20 ENCOUNTER — Encounter (HOSPITAL_COMMUNITY)
Admission: AD | Disposition: A | Discharge status: Home / Self Care | Payer: Self-pay | Source: Ambulatory Visit | Attending: Obstetrics & Gynecology

## 2016-11-20 ENCOUNTER — Encounter: Payer: Self-pay | Admitting: Obstetrics and Gynecology

## 2016-11-20 DIAGNOSIS — O3433 Maternal care for cervical incompetence, third trimester: Secondary | ICD-10-CM | POA: Diagnosis present

## 2016-11-20 DIAGNOSIS — O321XX0 Maternal care for breech presentation, not applicable or unspecified: Secondary | ICD-10-CM | POA: Diagnosis present

## 2016-11-20 DIAGNOSIS — O42913 Preterm premature rupture of membranes, unspecified as to length of time between rupture and onset of labor, third trimester: Secondary | ICD-10-CM | POA: Diagnosis present

## 2016-11-20 DIAGNOSIS — Z23 Encounter for immunization: Secondary | ICD-10-CM

## 2016-11-20 DIAGNOSIS — Z3A36 36 weeks gestation of pregnancy: Secondary | ICD-10-CM

## 2016-11-20 DIAGNOSIS — Z98891 History of uterine scar from previous surgery: Secondary | ICD-10-CM

## 2016-11-20 LAB — CBC
HEMATOCRIT: 33.1 % — AB (ref 36.0–46.0)
HEMOGLOBIN: 10.8 g/dL — AB (ref 12.0–15.0)
MCH: 29.6 pg (ref 26.0–34.0)
MCHC: 32.6 g/dL (ref 30.0–36.0)
MCV: 90.7 fL (ref 78.0–100.0)
Platelets: 266 10*3/uL (ref 150–400)
RBC: 3.65 MIL/uL — ABNORMAL LOW (ref 3.87–5.11)
RDW: 16 % — AB (ref 11.5–15.5)
WBC: 15.5 10*3/uL — ABNORMAL HIGH (ref 4.0–10.5)

## 2016-11-20 LAB — TYPE AND SCREEN
ABO/RH(D): O POS
ANTIBODY SCREEN: NEGATIVE

## 2016-11-20 LAB — POCT FERN TEST: POCT Fern Test: POSITIVE

## 2016-11-20 SURGERY — Surgical Case
Anesthesia: Spinal

## 2016-11-20 MED ORDER — DEXAMETHASONE SODIUM PHOSPHATE 4 MG/ML IJ SOLN
INTRAMUSCULAR | Status: DC | PRN
  Administered 2016-11-20: 4 mg via INTRAVENOUS

## 2016-11-20 MED ORDER — FENTANYL CITRATE (PF) 100 MCG/2ML IJ SOLN
INTRAMUSCULAR | Status: AC
  Filled 2016-11-20: qty 2

## 2016-11-20 MED ORDER — LACTATED RINGERS IV SOLN
INTRAVENOUS | Status: DC
  Administered 2016-11-20 (×2): via INTRAVENOUS

## 2016-11-20 MED ORDER — SCOPOLAMINE 1 MG/3DAYS TD PT72
MEDICATED_PATCH | TRANSDERMAL | Status: DC | PRN
  Administered 2016-11-20: 1 via TRANSDERMAL

## 2016-11-20 MED ORDER — BUPIVACAINE IN DEXTROSE 0.75-8.25 % IT SOLN
INTRATHECAL | Status: DC | PRN
  Administered 2016-11-20: 12 mL via INTRATHECAL

## 2016-11-20 MED ORDER — MORPHINE SULFATE (PF) 0.5 MG/ML IJ SOLN: INTRAMUSCULAR | Status: DC | PRN

## 2016-11-20 MED ORDER — SOD CITRATE-CITRIC ACID 500-334 MG/5ML PO SOLN
30.0000 mL | ORAL | Status: AC
  Administered 2016-11-20: 30 mL via ORAL
  Filled 2016-11-20: qty 15

## 2016-11-20 MED ORDER — ONDANSETRON HCL 4 MG/2ML IJ SOLN
INTRAMUSCULAR | Status: DC | PRN
  Administered 2016-11-20: 4 mg via INTRAVENOUS

## 2016-11-20 MED ORDER — BUPIVACAINE HCL (PF) 0.5 % IJ SOLN
INTRAMUSCULAR | Status: DC | PRN
  Administered 2016-11-20: 30 mL

## 2016-11-20 MED ORDER — PHENYLEPHRINE 8 MG IN D5W 100 ML (0.08MG/ML) PREMIX OPTIME
INJECTION | INTRAVENOUS | Status: DC | PRN
  Administered 2016-11-20: 60 ug/min via INTRAVENOUS

## 2016-11-20 MED ORDER — FAMOTIDINE IN NACL 20-0.9 MG/50ML-% IV SOLN
20.0000 mg | Freq: Once | INTRAVENOUS | Status: AC
  Administered 2016-11-20: 20 mg via INTRAVENOUS
  Filled 2016-11-20: qty 50

## 2016-11-20 MED ORDER — SODIUM CHLORIDE 0.9 % IR SOLN
Status: DC | PRN
  Administered 2016-11-20: 1000 mL

## 2016-11-20 MED ORDER — OXYTOCIN 10 UNIT/ML IJ SOLN
INTRAVENOUS | Status: DC | PRN
  Administered 2016-11-20: 40 [IU] via INTRAVENOUS

## 2016-11-20 MED ORDER — BETAMETHASONE SOD PHOS & ACET 6 (3-3) MG/ML IJ SUSP
12.0000 mg | Freq: Once | INTRAMUSCULAR | Status: AC
  Administered 2016-11-20: 12 mg via INTRAMUSCULAR
  Filled 2016-11-20: qty 2

## 2016-11-20 MED ORDER — BUPIVACAINE HCL (PF) 0.5 % IJ SOLN
INTRAMUSCULAR | Status: AC
  Filled 2016-11-20: qty 30

## 2016-11-20 MED ORDER — ONDANSETRON HCL 4 MG/2ML IJ SOLN
INTRAMUSCULAR | Status: AC
  Filled 2016-11-20: qty 2

## 2016-11-20 MED ORDER — BUPIVACAINE IN DEXTROSE 0.75-8.25 % IT SOLN
INTRATHECAL | Status: AC
  Filled 2016-11-20: qty 2

## 2016-11-20 MED ORDER — CEFAZOLIN SODIUM-DEXTROSE 2-4 GM/100ML-% IV SOLN
2.0000 g | INTRAVENOUS | Status: AC
  Administered 2016-11-20: 2 g via INTRAVENOUS
  Filled 2016-11-20: qty 100

## 2016-11-20 MED ORDER — DEXTROSE 5 % IV SOLN
500.0000 mg | Freq: Once | INTRAVENOUS | Status: AC
  Administered 2016-11-20: 500 mg via INTRAVENOUS
  Filled 2016-11-20: qty 500

## 2016-11-20 MED ORDER — DEXAMETHASONE SODIUM PHOSPHATE 4 MG/ML IJ SOLN
INTRAMUSCULAR | Status: AC
  Filled 2016-11-20: qty 1

## 2016-11-20 MED ORDER — FENTANYL CITRATE (PF) 100 MCG/2ML IJ SOLN
INTRAMUSCULAR | Status: DC | PRN
  Administered 2016-11-20: 50 ug via INTRAVENOUS
  Administered 2016-11-20: 10 ug via INTRATHECAL

## 2016-11-20 MED ORDER — MORPHINE SULFATE (PF) 0.5 MG/ML IJ SOLN
INTRAMUSCULAR | Status: AC
  Filled 2016-11-20: qty 10

## 2016-11-20 MED ORDER — OXYTOCIN 10 UNIT/ML IJ SOLN
INTRAMUSCULAR | Status: AC
  Filled 2016-11-20: qty 4

## 2016-11-20 MED ORDER — PHENYLEPHRINE 8 MG IN D5W 100 ML (0.08MG/ML) PREMIX OPTIME
INJECTION | INTRAVENOUS | Status: AC
  Filled 2016-11-20: qty 100

## 2016-11-20 SURGICAL SUPPLY — 36 items
BENZOIN TINCTURE PRP APPL 2/3 (GAUZE/BANDAGES/DRESSINGS) ×3 IMPLANT
BLADE TIP J-PLASMA PRECISE LAP (MISCELLANEOUS) ×3 IMPLANT
CHLORAPREP W/TINT 26ML (MISCELLANEOUS) ×3 IMPLANT
CLAMP CORD UMBIL (MISCELLANEOUS) IMPLANT
CLOSURE WOUND 1/2 X4 (GAUZE/BANDAGES/DRESSINGS) ×1
CLOTH BEACON ORANGE TIMEOUT ST (SAFETY) ×3 IMPLANT
DRSG OPSITE POSTOP 4X10 (GAUZE/BANDAGES/DRESSINGS) ×3 IMPLANT
ELECT REM PT RETURN 9FT ADLT (ELECTROSURGICAL) ×3
ELECTRODE REM PT RTRN 9FT ADLT (ELECTROSURGICAL) ×1 IMPLANT
EXTRACTOR VACUUM KIWI (MISCELLANEOUS) IMPLANT
GLOVE BIO SURGEON STRL SZ7 (GLOVE) ×3 IMPLANT
GLOVE BIOGEL PI IND STRL 7.0 (GLOVE) ×2 IMPLANT
GLOVE BIOGEL PI INDICATOR 7.0 (GLOVE) ×4
GOWN STRL REUS W/TWL LRG LVL3 (GOWN DISPOSABLE) ×6 IMPLANT
GOWN STRL REUS W/TWL XL LVL3 (GOWN DISPOSABLE) ×3 IMPLANT
HEMOSTAT ARISTA ABSORB 3G PWDR (MISCELLANEOUS) ×3 IMPLANT
KIT ABG SYR 3ML LUER SLIP (SYRINGE) IMPLANT
NEEDLE HYPO 22GX1.5 SAFETY (NEEDLE) ×3 IMPLANT
NEEDLE HYPO 25X5/8 SAFETYGLIDE (NEEDLE) IMPLANT
NS IRRIG 1000ML POUR BTL (IV SOLUTION) ×3 IMPLANT
PACK C SECTION WH (CUSTOM PROCEDURE TRAY) ×3 IMPLANT
PAD OB MATERNITY 4.3X12.25 (PERSONAL CARE ITEMS) ×3 IMPLANT
PENCIL SMOKE EVAC W/HOLSTER (ELECTROSURGICAL) ×3 IMPLANT
RTRCTR C-SECT PINK 25CM LRG (MISCELLANEOUS) ×3 IMPLANT
SPONGE SURGIFOAM ABS GEL 12-7 (HEMOSTASIS) IMPLANT
STRIP CLOSURE SKIN 1/2X4 (GAUZE/BANDAGES/DRESSINGS) ×2 IMPLANT
SUT PDS AB 0 CTX 60 (SUTURE) IMPLANT
SUT PLAIN 0 NONE (SUTURE) IMPLANT
SUT SILK 0 TIES 10X30 (SUTURE) IMPLANT
SUT VIC AB 0 CT1 36 (SUTURE) ×9 IMPLANT
SUT VIC AB 3-0 CT1 27 (SUTURE) ×2
SUT VIC AB 3-0 CT1 TAPERPNT 27 (SUTURE) ×1 IMPLANT
SUT VIC AB 4-0 KS 27 (SUTURE) IMPLANT
SYR CONTROL 10ML LL (SYRINGE) ×3 IMPLANT
TOWEL OR 17X24 6PK STRL BLUE (TOWEL DISPOSABLE) ×3 IMPLANT
TRAY FOLEY BAG SILVER LF 14FR (SET/KITS/TRAYS/PACK) ×3 IMPLANT

## 2016-11-20 NOTE — MAU Note (Signed)
Carloyn Jaeger, CNM bedside u/s for presentation--Breech. Dr. Nira Retort notified.

## 2016-11-20 NOTE — Anesthesia Procedure Notes (Signed)
Spinal  Patient location during procedure: OR Start time: 11/20/2016 10:42 PM End time: 11/20/2016 10:49 PM Staffing Anesthesiologist: Heather Roberts Performed: anesthesiologist  Preanesthetic Checklist Completed: patient identified, surgical consent, pre-op evaluation, timeout performed, IV checked, risks and benefits discussed and monitors and equipment checked Spinal Block Patient position: sitting Prep: DuraPrep Patient monitoring: cardiac monitor, continuous pulse ox and blood pressure Approach: midline Location: L2-3 Injection technique: single-shot Needle Needle type: Pencan  Needle gauge: 24 G Needle length: 9 cm Additional Notes Functioning IV was confirmed and monitors were applied. Sterile prep and drape, including hand hygiene and sterile gloves were used. The patient was positioned and the spine was prepped. The skin was anesthetized with lidocaine.  Free flow of clear CSF was obtained prior to injecting local anesthetic into the CSF.  The spinal needle aspirated freely following injection.  The needle was carefully withdrawn.  The patient tolerated the procedure well.

## 2016-11-20 NOTE — MAU Note (Signed)
LOF--clear since 4pm today Denies any pain at this time +FM States she still has her cerclage in place

## 2016-11-20 NOTE — MAU Note (Signed)
Dr. Nira Retort bedside discussing c/section

## 2016-11-20 NOTE — Telephone Encounter (Signed)
Siyana also called and left a voice message 11/19/16 am stating she is calling to see if Dr. Adrian Blackwater approved her note.  I called Ayden and left a message I am returning her call and that we have also sent  A My Chart message- please check your MyChart and if you still have a question- please call our office.

## 2016-11-20 NOTE — Op Note (Signed)
Cesarean Section Operative Report  Sherri Shepherd  PROCEDURE DATE: 11/20/2016  PREOPERATIVE DIAGNOSES: Intrauterine pregnancy at [redacted]w[redacted]d weeks gestation; PPROM; breech presentation; s/p cerclage  POSTOPERATIVE DIAGNOSES: The same  PROCEDURE: Primary Low Transverse Cesarean Section; Cerclage removal  SURGEON:   Surgeon(s) and Role:    * Willodean Rosenthal, MD - Primary    * Jazmynn Pho, Kandra Nicolas, MD - OB Fellow  ASSISTANT:  Nolene Ebbs, MD - OB Fellow   INDICATIONS: Sherri Shepherd is a 26 y.o. [phone] at [redacted]w[redacted]d here for cesarean section secondary to the indications listed under preoperative diagnoses; please see preoperative note for further details.  The risks of cesarean section were discussed with the patient including but were not limited to: bleeding which may require transfusion or reoperation; infection which may require antibiotics; injury to bowel, bladder, ureters or other surrounding organs; injury to the fetus; need for additional procedures including hysterectomy in the event of a life-threatening hemorrhage; placental abnormalities wth subsequent pregnancies, incisional problems, thromboembolic phenomenon and other postoperative/anesthesia complications.   The patient concurred with the proposed plan, giving informed written consent for the procedure.    FINDINGS:  Viable female infant in complete breech presentation.  Apgars 10 and 10.  Clear amniotic fluid.  Intact placenta, three vessel cord.  Normal uterus, fallopian tubes and ovaries bilaterally.  ANESTHESIA: Spinal* Total I/O  In: 1000 [I.V.:1000]  Out: 1045 [Urine:300; Blood:745] SPECIMENS: Placenta sent to pathology COMPLICATIONS: None immediate  PROCEDURE IN DETAIL:  The patient preoperatively received intravenous antibiotics and had sequential compression devices applied to her lower extremities.  She was then taken to the operating room where spinal anesthesia was administeredand was found to be adequate. She  was then placed in a dorsal supine position with a leftward tilt. A foley catheter was placed into her bladder and attached to constant gravity.  Her cervical cerclage was cut and removed.  She was then prepped and draped in a sterile manner.   After an adequate timeout was performed, a Pfannenstiel skin incision was made with scalpel and carried through to the underlying layer of fascia. The fascia was incised in the midline, and this incision was extended bilaterally using the Mayo scissors.  Kocher clamps were applied to the superior aspect of the fascial incision and the underlying rectus muscles were dissected off bluntly.  A similar process was carried out on the inferior aspect of the fascial incision. The rectus muscles were separated in the midline bluntly and the peritoneum was entered bluntly. Attention was turned to the lower uterine segment; a bladder flap was created, and then a low transverse hysterotomy was made with a scalpel and extended bilaterally bluntly.  The infant was successfully delivered in breech presentation, the cord was clamped and cut after one minute, and the infant was handed over to the awaiting neonatology team. Uterine massage was then administered, and the placenta delivered intact with a three-vessel cord. The uterus was then cleared of clots and debris.  The hysterotomy was closed with 0 Vicryl in a running locked fashion, and an imbricating layer was also placed with 0 Vicryl.  Figure-of-eight 0 Vicryl serosal stitches were placed to help with hemostasis.  The pelvis was cleared of all clot and debris. Hemostasis was confirmed on all surfaces.  The peritoneum and rectus muscles were reapproximated using 0 Vicryl interrupted stitch. The fascia was then closed using 0 Vicryl in a running fashion.  The subcutaneous layer was irrigated, and 30 ml of 0.5% Marcaine was injected subcutaneously  around the incision.  The skin was closed with a 4-0 Vicryl subcuticular stitch.   The  patient tolerated the procedure well. Sponge, lap, instrument and needle counts were correct x 3.  She was taken to the recovery room in stable condition.    Disposition: PACU - hemodynamically stable.   Maternal Condition: stable    Signed: Frederik Pear, MD OB Fellow 11/20/2016 11:57 PM

## 2016-11-20 NOTE — H&P (Signed)
Obstetric Preoperative History and Physical  Sherri Shepherd is a 26 y.o. [id number] with IUP at [redacted]w[redacted]d, and pregnancy complicated by history of PTD, incompetent cervix, s/p cerclage (08/09/16), presenting for ROM at 1600 today. Reports good fetal movement. Not feeling contractions. Fetal presentation cephalic on last ultrasound, and today on bedside ultrasound in [facility].  Her last meal was at 14:15 today.  Prenatal Course Source of Care: [facility] Pregnancy complications or risks: Patient Active Problem List   Diagnosis Date Noted  . Cervical shortening during pregnancy, antepartum 08/09/2016  . History of incompetent cervix, currently pregnant   . Headache in pregnancy, antepartum, second trimester 06/25/2016  . Supervision of high risk pregnancy, antepartum 06/04/2016  . History of preterm delivery 03/13/2015   She plans to bottle feed She desires Nexplanon for postpartum contraception.   Prenatal labs and studies: ABO, Rh: O/Positive/-- (04/12 2956) Antibody: Negative (04/12 0858) Rubella: 2.11 (04/12 0858) RPR: Non Reactive (07/23 0834)  HBsAg: Negative (04/12 0858)  HIV:   NR [id number] (09/18 0000) 2-hr GTT: 79, 144, 90 (normal) Genetic screening negative first trimester screen Anatomy US normal  Prenatal Transfer Tool  Maternal Diabetes: No Genetic Screening: Normal Maternal Ultrasounds/Referrals: Normal Fetal Ultrasounds or other Referrals:  None Maternal Substance Abuse:  No Significant Maternal Medications:  Tylenol, flexeril Significant Maternal Lab Results: None  Past Medical History:  Diagnosis Date  . Chlamydia Feb 2017  . H/O varicella   . Headache in pregnancy, antepartum, second trimester 06/25/2016  . Premature dilatation of cervix during pregnancy 10/01/2011   5/90%/-1  . Preterm delivery 12/04/2011   SVD at [redacted]w[redacted]d on 10/22/11    Past Surgical History:  Procedure Laterality Date  . CERVICAL CERCLAGE N/A 08/09/2016   Procedure: CERCLAGE CERVICAL;  Surgeon:  Catalina Antigua, MD;  Location: [facility];  Service: Gynecology;  Laterality: N/A;    OB History  Gravida Para Term Preterm AB Living  3 2 0 2 0 1  SAB TAB Ectopic Multiple Live Births  0 0 0 0 1    # Outcome Date GA Lbr Len/2nd Weight Sex Delivery Anes PTL Lv  3 Current           2 Preterm 03/14/15 [redacted]w[redacted]d  10 oz (0.283 kg) M Vag-Spont None  FD  1 Preterm 10/22/11 [redacted]w[redacted]d 11:10 / 03:30 5 lb 12.4 oz (2.62 kg) M Vag-Spont EPI  LIV     Birth Comments: none      Social History   Social History  . Marital status: Single    Spouse name: N/A  . Number of children: N/A  . Years of education: 11   Occupational History  . HOMEMAKER    Social History Main Topics  . Smoking status: Never Smoker  . Smokeless tobacco: Never Used  . Alcohol use No  . Drug use: No  . Sexual activity: Yes    Partners: Male    Birth control/ protection: None   Other Topics Concern  . Not on file   Social History Narrative  . No narrative on file    Family History  Problem Relation Age of Onset  . Hypertension Mother   . Diabetes Brother   . Cancer Maternal Grandmother        LUNG  . Diabetes Maternal Grandfather   . Arthritis Paternal Grandmother   . Cancer Paternal Grandmother        BREAST  . Diabetes Maternal Uncle     Prescriptions Prior to Admission  Medication Sig Dispense Refill  Last Dose  . acetaminophen (TYLENOL) 500 MG tablet Take 1,000 mg by mouth every 6 (six) hours as needed for headache.    Taking  . cyclobenzaprine (FLEXERIL) 10 MG tablet Take 1 tablet (10 mg total) by mouth 3 (three) times daily as needed for muscle spasms. 30 tablet 2 Taking  . metroNIDAZOLE (FLAGYL) 500 MG tablet Take 1 tablet (500 mg total) by mouth 2 (two) times daily. 14 tablet 0 11/12/2016 at Unknown time  . Prenatal Vit-Fe Fumarate-FA (PRENATAL MULTIVITAMIN) TABS tablet Take 1 tablet by mouth daily at 12 noon. 30 tablet 0 Taking    No Known Allergies  Review of Systems: Negative except for what is  mentioned in HPI.  Physical Exam: BP 129/83 (BP Location: Right Arm)   Pulse (!) 105   Temp 97.9 F (36.6 C) (Oral)   Resp 16   LMP 03/24/2016 (Exact Date)   SpO2 100%   CONSTITUTIONAL: Well-developed, well-nourished female in no acute distress.  HENT:  Normocephalic, atraumatic. Oropharynx is clear and moist EYES: Conjunctivae and EOM are normal. No scleral icterus.  NECK: Normal range of motion, supple SKIN: Skin is warm and dry. No rash noted. Not diaphoretic. NEUROLGIC: Alert and oriented to person, place, and time. Normal muscle tone coordination. No cranial nerve deficit noted. PSYCHIATRIC: Normal mood and affect. Normal behavior. CARDIOVASCULAR: Normal heart rate noted, regular rhythm RESPIRATORY: Effort and breath sounds normal, no problems with respiration noted ABDOMEN: Soft, nontender, nondistended, gravid. PELVIC: Deferred MUSCULOSKELETAL: Normal range of motion. No edema and no tenderness. 2+ distal pulses.  FHT: 145, moderate variability, +acel, no decel  Pertinent Labs/Studies:   Results for orders placed or performed during the hospital encounter of 11/20/16 (from the past 72 hour(s))  POCT fern test     Status: None   Collection Time: 11/20/16  4:57 PM  Result Value Ref Range   POCT Fern Test Positive = ruptured amniotic membanes     Assessment and Plan :Sherri Shepherd is a 26 y.o. [id number] at [redacted]w[redacted]d being admitted for PPROM. Fetal presentation is breech by U/S.  The risks of cesarean section discussed with the patient included but were not limited to: bleeding which may require transfusion or reoperation; infection which may require antibiotics; injury to bowel, bladder, ureters or other surrounding organs; injury to the fetus; need for additional procedures including hysterectomy in the event of a life-threatening hemorrhage; placental abnormalities wth subsequent pregnancies, incisional problems, thromboembolic phenomenon and other postoperative/anesthesia  complications. The patient concurred with the proposed plan, giving informed written consent for the procedure.  Anesthesia and OR aware. Preoperative prophylactic antibiotics and SCDs ordered on call to the OR. To OR when ready.    Raynelle Fanning P. Degele, MD OB Fellow Faculty Practice, [facility]   The risks of cesarean section discussed with the patient included but were not limited to: bleeding which may require transfusion or reoperation; infection which may require antibiotics; injury to bowel, bladder, ureters or other surrounding organs; injury to the fetus; need for additional procedures including hysterectomy in the event of a life-threatening hemorrhage; placental abnormalities wth subsequent pregnancies, incisional problems, thromboembolic phenomenon and other postoperative/anesthesia complications. The patient concurred with the proposed plan, giving informed written consent for the procedure.   Patient has been NPO since this afternoon. She will remain NPO for procedure. Anesthesia and OR aware. Preoperative prophylactic antibiotics and SCDs ordered on call to the OR.  To OR when ready.  Dollye Glasser L. Harraway-Smith, M.D., [facility]

## 2016-11-20 NOTE — Telephone Encounter (Signed)
See previous phone calls and mychart messages. Sherri Shepherd has sent several messages today and called front desk and office. She understands Dr. Adrian Blackwater said he can not give her a note to be out of work but states Dr. Vergie Living told her before that he was making her light duty but if it got worse he would take her out of work. She states she is having pelvic pain and it hurts when she stands or walks. We discussed since she is 36 weeks now that it is common in many pregnant women to have pain and be uncomfortable when standing, walking, lying down and hard to move. I explained I will forward her request to Dr. Vergie Living since she states he told her this previously; but that he is not in the office today and I am not sure how soon he will see this- but when he replies we will get back to her. She voices understanding.

## 2016-11-20 NOTE — Anesthesia Preprocedure Evaluation (Signed)
Anesthesia Evaluation  Patient identified by MRN, date of birth, ID band Patient awake    Reviewed: Allergy & Precautions, NPO status , Patient's Chart, lab work & pertinent test results  History of Anesthesia Complications Negative for: history of anesthetic complications  Airway Mallampati: II  TM Distance: >3 FB Neck ROM: Full    Dental no notable dental hx. (+) Dental Advisory Given   Pulmonary neg pulmonary ROS,    Pulmonary exam normal breath sounds clear to auscultation       Cardiovascular negative cardio ROS Normal cardiovascular exam Rhythm:Regular Rate:Normal     Neuro/Psych negative neurological ROS  negative psych ROS   GI/Hepatic negative GI ROS, Neg liver ROS,   Endo/Other  negative endocrine ROS  Renal/GU negative Renal ROS  negative genitourinary   Musculoskeletal negative musculoskeletal ROS (+)   Abdominal   Peds negative pediatric ROS (+)  Hematology negative hematology ROS (+)   Anesthesia Other Findings   Reproductive/Obstetrics (+) Pregnancy                             Anesthesia Physical  Anesthesia Plan  ASA: II  Anesthesia Plan: Spinal   Post-op Pain Management:    Induction:   PONV Risk Score and Plan: 0 and 2  Airway Management Planned: Natural Airway  Additional Equipment:   Intra-op Plan:   Post-operative Plan:   Informed Consent: I have reviewed the patients History and Physical, chart, labs and discussed the procedure including the risks, benefits and alternatives for the proposed anesthesia with the patient or authorized representative who has indicated his/her understanding and acceptance.   Dental advisory given  Plan Discussed with: CRNA and Anesthesiologist  Anesthesia Plan Comments:         Anesthesia Quick Evaluation

## 2016-11-21 ENCOUNTER — Encounter (HOSPITAL_COMMUNITY): Payer: Self-pay

## 2016-11-21 ENCOUNTER — Encounter (HOSPITAL_COMMUNITY)
Admission: AD | Disposition: A | Discharge status: Home / Self Care | Payer: Self-pay | Source: Ambulatory Visit | Attending: Obstetrics & Gynecology

## 2016-11-21 DIAGNOSIS — Z98891 History of uterine scar from previous surgery: Secondary | ICD-10-CM

## 2016-11-21 LAB — CBC
HEMATOCRIT: 30.7 % — AB (ref 36.0–46.0)
Hemoglobin: 10.2 g/dL — ABNORMAL LOW (ref 12.0–15.0)
MCH: 29.9 pg (ref 26.0–34.0)
MCHC: 33.2 g/dL (ref 30.0–36.0)
MCV: 90 fL (ref 78.0–100.0)
Platelets: 258 10*3/uL (ref 150–400)
RBC: 3.41 MIL/uL — ABNORMAL LOW (ref 3.87–5.11)
RDW: 16.1 % — AB (ref 11.5–15.5)
WBC: 26.5 10*3/uL — ABNORMAL HIGH (ref 4.0–10.5)

## 2016-11-21 LAB — RPR: RPR Ser Ql: NONREACTIVE

## 2016-11-21 SURGERY — CERCLAGE, CERVIX, VAGINAL APPROACH
Wound class: Clean Contaminated

## 2016-11-21 MED ORDER — DIBUCAINE 1 % RE OINT: 1.0000 "application " | TOPICAL_OINTMENT | RECTAL | Status: DC | PRN

## 2016-11-21 MED ORDER — SIMETHICONE 80 MG PO CHEW: 80.0000 mg | CHEWABLE_TABLET | ORAL | Status: DC | PRN

## 2016-11-21 MED ORDER — NALBUPHINE HCL 10 MG/ML IJ SOLN: 5.0000 mg | INTRAMUSCULAR | Status: DC | PRN

## 2016-11-21 MED ORDER — MENTHOL 3 MG MT LOZG: 1.0000 | LOZENGE | OROMUCOSAL | Status: DC | PRN

## 2016-11-21 MED ORDER — NALBUPHINE HCL 10 MG/ML IJ SOLN: 5.0000 mg | Freq: Once | INTRAMUSCULAR | Status: DC | PRN

## 2016-11-21 MED ORDER — SODIUM CHLORIDE 0.9% FLUSH: 3.0000 mL | INTRAVENOUS | Status: DC | PRN

## 2016-11-21 MED ORDER — ACETAMINOPHEN 325 MG PO TABS
650.0000 mg | ORAL_TABLET | ORAL | Status: DC | PRN
  Administered 2016-11-21 – 2016-11-22 (×4): 650 mg via ORAL
  Filled 2016-11-21 (×4): qty 2

## 2016-11-21 MED ORDER — SCOPOLAMINE 1 MG/3DAYS TD PT72: 1.0000 | MEDICATED_PATCH | Freq: Once | TRANSDERMAL | Status: DC

## 2016-11-21 MED ORDER — ONDANSETRON HCL 4 MG/2ML IJ SOLN
4.0000 mg | Freq: Three times a day (TID) | INTRAMUSCULAR | Status: DC | PRN
Start: 2016-11-21 — End: 2016-11-22

## 2016-11-21 MED ORDER — TETANUS-DIPHTH-ACELL PERTUSSIS 5-2.5-18.5 LF-MCG/0.5 IM SUSP: 0.5000 mL | Freq: Once | INTRAMUSCULAR | Status: DC

## 2016-11-21 MED ORDER — OXYCODONE HCL 5 MG PO TABS: 10.0000 mg | ORAL_TABLET | ORAL | Status: DC | PRN

## 2016-11-21 MED ORDER — IBUPROFEN 600 MG PO TABS
600.0000 mg | ORAL_TABLET | Freq: Four times a day (QID) | ORAL | Status: DC
  Administered 2016-11-21 – 2016-11-22 (×6): 600 mg via ORAL
  Filled 2016-11-21 (×6): qty 1

## 2016-11-21 MED ORDER — OXYTOCIN 40 UNITS IN LACTATED RINGERS INFUSION - SIMPLE MED: 2.5000 [IU]/h | INTRAVENOUS | Status: AC

## 2016-11-21 MED ORDER — SENNOSIDES-DOCUSATE SODIUM 8.6-50 MG PO TABS
2.0000 | ORAL_TABLET | ORAL | Status: DC
  Administered 2016-11-21 – 2016-11-22 (×2): 2 via ORAL
  Filled 2016-11-21 (×2): qty 2

## 2016-11-21 MED ORDER — PROMETHAZINE HCL 25 MG/ML IJ SOLN
INTRAMUSCULAR | Status: AC
  Administered 2016-11-21: 6.25 mg via INTRAVENOUS
  Filled 2016-11-21: qty 1

## 2016-11-21 MED ORDER — DIPHENHYDRAMINE HCL 25 MG PO CAPS
25.0000 mg | ORAL_CAPSULE | Freq: Four times a day (QID) | ORAL | Status: DC | PRN
  Administered 2016-11-21 (×2): 25 mg via ORAL
  Filled 2016-11-21 (×2): qty 1

## 2016-11-21 MED ORDER — WITCH HAZEL-GLYCERIN EX PADS: 1.0000 "application " | MEDICATED_PAD | CUTANEOUS | Status: DC | PRN

## 2016-11-21 MED ORDER — SIMETHICONE 80 MG PO CHEW
80.0000 mg | CHEWABLE_TABLET | ORAL | Status: DC
  Administered 2016-11-21: 80 mg via ORAL
  Filled 2016-11-21: qty 1

## 2016-11-21 MED ORDER — NALOXONE HCL 0.4 MG/ML IJ SOLN: 0.4000 mg | INTRAMUSCULAR | Status: DC | PRN

## 2016-11-21 MED ORDER — LACTATED RINGERS IV SOLN
INTRAVENOUS | Status: DC
  Administered 2016-11-21: 14:00:00 via INTRAVENOUS

## 2016-11-21 MED ORDER — DIPHENHYDRAMINE HCL 25 MG PO CAPS: 25.0000 mg | ORAL_CAPSULE | ORAL | Status: DC | PRN

## 2016-11-21 MED ORDER — HYDROMORPHONE HCL 1 MG/ML IJ SOLN: 0.2500 mg | INTRAMUSCULAR | Status: DC | PRN

## 2016-11-21 MED ORDER — COCONUT OIL OIL: 1.0000 "application " | TOPICAL_OIL | Status: DC | PRN

## 2016-11-21 MED ORDER — NALOXONE HCL 2 MG/2ML IJ SOSY
1.0000 ug/kg/h | PREFILLED_SYRINGE | INTRAVENOUS | Status: DC | PRN
  Filled 2016-11-21: qty 2

## 2016-11-21 MED ORDER — OXYCODONE HCL 5 MG PO TABS
5.0000 mg | ORAL_TABLET | ORAL | Status: DC | PRN
  Administered 2016-11-22: 5 mg via ORAL
  Filled 2016-11-21: qty 1

## 2016-11-21 MED ORDER — PRENATAL MULTIVITAMIN CH
1.0000 | ORAL_TABLET | Freq: Every day | ORAL | Status: DC
  Administered 2016-11-21 – 2016-11-22 (×2): 1 via ORAL
  Filled 2016-11-21 (×2): qty 1

## 2016-11-21 MED ORDER — MORPHINE SULFATE (PF) 0.5 MG/ML IJ SOLN
INTRAMUSCULAR | Status: DC | PRN
  Administered 2016-11-20: .2 mg via INTRATHECAL

## 2016-11-21 MED ORDER — SIMETHICONE 80 MG PO CHEW
80.0000 mg | CHEWABLE_TABLET | Freq: Three times a day (TID) | ORAL | Status: DC
  Administered 2016-11-21 – 2016-11-22 (×4): 80 mg via ORAL
  Filled 2016-11-21 (×4): qty 1

## 2016-11-21 MED ORDER — DIPHENHYDRAMINE HCL 50 MG/ML IJ SOLN
12.5000 mg | INTRAMUSCULAR | Status: DC | PRN
Start: 2016-11-21 — End: 2016-11-22

## 2016-11-21 MED ORDER — PROMETHAZINE HCL 25 MG/ML IJ SOLN
6.2500 mg | INTRAMUSCULAR | Status: DC | PRN
  Administered 2016-11-21: 6.25 mg via INTRAVENOUS

## 2016-11-21 MED ORDER — MEPERIDINE HCL 25 MG/ML IJ SOLN: 6.2500 mg | INTRAMUSCULAR | Status: DC | PRN

## 2016-11-21 MED ORDER — ZOLPIDEM TARTRATE 5 MG PO TABS: 5.0000 mg | ORAL_TABLET | Freq: Every evening | ORAL | Status: DC | PRN

## 2016-11-21 NOTE — Progress Notes (Signed)
MOB honeycomb dsg saturated and coming off. Called midwife on call for dsg change order. New order received. Will continue to monitor.

## 2016-11-21 NOTE — Anesthesia Postprocedure Evaluation (Signed)
Anesthesia Post Note  Patient: Sherri Shepherd  Procedure(s) Performed: Procedure(s) (LRB): CESAREAN SECTION (N/A)     Patient location during evaluation: PACU Anesthesia Type: Spinal Level of consciousness: awake and alert Pain management: pain level controlled Vital Signs Assessment: post-procedure vital signs reviewed and stable Respiratory status: spontaneous breathing and respiratory function stable Cardiovascular status: blood pressure returned to baseline and stable Postop Assessment: spinal receding Anesthetic complications: no    Last Vitals:  Vitals:   11/21/16 0115 11/21/16 0130  BP: 100/83 115/68  Pulse: 90 88  Resp: 14 18  Temp:    SpO2: 99% 99%    Last Pain:  Vitals:   11/21/16 0130  TempSrc:   PainSc: 0-No pain   Pain Goal:                 [name] DANIEL

## 2016-11-21 NOTE — Progress Notes (Signed)
Called midwife on call about MOB about incision oozing once old honeycomb dgs was removed. Midwife instructed to apply pressure dsg.

## 2016-11-21 NOTE — Anesthesia Postprocedure Evaluation (Signed)
Anesthesia Post Note  Patient: Sherri Shepherd  Procedure(s) Performed: Procedure(s) (LRB): CESAREAN SECTION (N/A)     Patient location during evaluation: Mother Baby Anesthesia Type: Spinal Level of consciousness: awake Pain management: satisfactory to patient Vital Signs Assessment: post-procedure vital signs reviewed and stable Respiratory status: spontaneous breathing Cardiovascular status: stable Postop Assessment: patient able to bend at knees, no backache, no headache, no apparent nausea or vomiting and adequate PO intake Anesthetic complications: no Comments: Nurse informed of pt c/o burning pain 4-5 out of 10.  Pt instructed to ask nurse for more pain medicine.     Last Vitals:  Vitals:   11/21/16 0445 11/21/16 0603  BP: 114/62 111/66  Pulse: 78 78  Resp: 16 16  Temp: 36.7 C 36.4 C  SpO2: 99% 100%    Last Pain:  Vitals:   11/21/16 0603  TempSrc: Oral  PainSc: 0-No pain   Pain Goal:                 [employer]

## 2016-11-21 NOTE — Progress Notes (Signed)
Subjective: Postpartum Day #1: Cesarean Delivery Patient reports tolerating PO.  No dizziness with ambulation; foley cath still in place.  Objective: Vital signs in last 24 hours: Temp:  [97.6 F (36.4 C)-98.1 F (36.7 C)] 98.1 F (36.7 C) (09/28 0815) Pulse Rate:  [62-128] 86 (09/28 0815) Resp:  [14-20] 18 (09/28 0815) BP: (100-129)/(62-94) 129/79 (09/28 0815) SpO2:  [96 %-100 %] 98 % (09/28 0815)  Physical Exam:  General: alert and cooperative Lochia: appropriate Uterine Fundus: firm Incision: honeycomb saturated; some serosang d/c on bottom of pressure dsg DVT Evaluation: No evidence of DVT seen on physical exam.   Recent Labs  11/20/16 1711 11/21/16 0351  HGB 10.8* 10.2*  HCT 33.1* 30.7*    Assessment/Plan: Status post Cesarean section. Doing well postoperatively.  Continue current care. Will d/c foley later this morning. Anticipate d/c 11/22/16.  Cam Hai CNM 11/21/2016, 10:16 AM

## 2016-11-21 NOTE — Addendum Note (Signed)
Addendum  created 11/21/16 0817 by Algis Greenhouse, CRNA   Sign clinical note

## 2016-11-21 NOTE — Progress Notes (Signed)
md called regarding honeycomb saturated and coming off   New honeycomb applied with some new steris   Margins are approximated w/some old blood noted

## 2016-11-21 NOTE — Transfer of Care (Signed)
Immediate Anesthesia Transfer of Care Note  Patient: Sherri Shepherd  Procedure(s) Performed: Procedure(s): CESAREAN SECTION (N/A)  Patient Location: PACU  Anesthesia Type:Spinal  Level of Consciousness: awake, alert  and oriented  Airway & Oxygen Therapy: Patient Spontanous Breathing  Post-op Assessment: Report given to RN and Post -op Vital signs reviewed and stable BP 105/73, HR 72, RR 14, SaO2 99%  Post vital signs: Reviewed and stable  Last Vitals:  Vitals:   11/20/16 2032 11/20/16 2234  BP: 120/76 113/73  Pulse: (!) 103 (!) 105  Resp: 16   Temp: 36.7 C   SpO2:      Last Pain:  Vitals:   11/20/16 2236  TempSrc:   PainSc: 2          Complications: No apparent anesthesia complications

## 2016-11-21 NOTE — Progress Notes (Signed)
Called Midwife d/t pressure dsg coming off and MOB incision continuing to ooze and steri strips saturated instructed to call Surgeon to come assess MOB incision. Surgeon came removed steri-strips and reapplied new ones. Also applied honeycomb dsg and pressure dsg. Will coninue to monitor

## 2016-11-22 DIAGNOSIS — O3433 Maternal care for cervical incompetence, third trimester: Secondary | ICD-10-CM | POA: Diagnosis not present

## 2016-11-22 MED ORDER — OXYCODONE HCL 5 MG PO TABS: 5.0000 mg | ORAL_TABLET | ORAL | 0 refills | Status: DC | PRN

## 2016-11-22 MED ORDER — SENNOSIDES-DOCUSATE SODIUM 8.6-50 MG PO TABS: 2.0000 | ORAL_TABLET | Freq: Every evening | ORAL | 0 refills | Status: DC | PRN

## 2016-11-22 MED ORDER — IBUPROFEN 600 MG PO TABS: 600.0000 mg | ORAL_TABLET | Freq: Four times a day (QID) | ORAL | 0 refills | Status: DC

## 2016-11-22 NOTE — Discharge Summary (Signed)
OB Discharge Summary     Patient Name: Sherri Shepherd DOB: 19-Jan-1991 MRN: [id number]  Date of admission: 11/20/2016 Delivering MD: Willodean Rosenthal   Date of discharge: 11/22/2016  Admitting diagnosis: 36WKS WATER BROKE CTX remove cerclage Intrauterine pregnancy: [redacted]w[redacted]d     Secondary diagnosis:  Active Problems:   Status post primary low transverse cesarean section  Additional problems:  Patient Active Problem List   Diagnosis Date Noted  . Status post primary low transverse cesarean section 11/21/2016  . Cervical shortening during pregnancy, antepartum 08/09/2016  . History of incompetent cervix, currently pregnant   . Headache in pregnancy, antepartum, second trimester 06/25/2016  . Supervision of high risk pregnancy, antepartum 06/04/2016  . History of preterm delivery 03/13/2015      Discharge diagnosis: Preterm Pregnancy Delivered                                                                                                Post partum procedures:none  Augmentation: none  Complications: None  Hospital course:  Onset of Labor With Unplanned C/S  26 y.o. yo [id number] at [redacted]w[redacted]d was admitted with PPROM on 11/20/2016. Patient had a labor course significant for PTD, incompetent cervix, s/p cerclage (08/09/16). Membrane Rupture Time/Date: 4:00 PM ,11/20/2016   The patient went for cesarean section due to Malpresentation(breech), and delivered a Viable infant,11/20/2016  Details of operation can be found in separate operative note. Patient had an uncomplicated postpartum course.  She is ambulating,tolerating a regular diet, passing flatus, and urinating well.  Patient is discharged home in stable condition 11/22/16.  Physical exam  Vitals:   11/21/16 1330 11/21/16 1733 11/21/16 2120 11/22/16 0536  BP: 124/82 104/69 112/66 114/73  Pulse: 82 80 79 83  Resp: Temp:  97.9 F (36.6 C) 98.2 F (36.8 C) 97.9 F (36.6 C)  TempSrc:   Oral Oral  SpO2: 100% 97%      General: alert, cooperative and no distress Lochia: appropriate Uterine Fundus: firm Incision: Healing well with no significant drainage, Dressing is clean, dry, and intact DVT Evaluation: No evidence of DVT seen on physical exam. Labs: Lab Results  Component Value Date   WBC 26.5 (H) 11/21/2016   HGB 10.2 (L) 11/21/2016   HCT 30.7 (L) 11/21/2016   MCV 90.0 11/21/2016   PLT 258 11/21/2016   CMP Latest Ref Rng & Units 06/25/2016  Glucose 65 - 99 mg/dL 81  BUN 6 - 20 mg/dL 11  Creatinine 1.30 - 8.65 mg/dL 7.84  Sodium 696 - 295 mmol/L 133(L)  Potassium 3.5 - 5.1 mmol/L 4.3  Chloride 101 - 111 mmol/L 104  CO2 22 - 32 mmol/L 24  Calcium 8.9 - 10.3 mg/dL 9.1  Total Protein 6.5 - 8.1 g/dL 6.9  Total Bilirubin 0.3 - 1.2 mg/dL 0.4  Alkaline Phos 38 - 126 U/L 51  AST 15 - 41 U/L 20  ALT 14 - 54 U/L 17    Discharge instruction: per After Visit Summary and "Baby and Me Booklet".  After visit meds:  Allergies as of 11/22/2016   No Known  Allergies     Medication List    STOP taking these medications   cyclobenzaprine 10 MG tablet Commonly known as:  FLEXERIL     TAKE these medications   acetaminophen 500 MG tablet Commonly known as:  TYLENOL Take 1,000 mg by mouth every 6 (six) hours as needed for headache.   ibuprofen 600 MG tablet Commonly known as:  ADVIL,MOTRIN Take 1 tablet (600 mg total) by mouth every 6 (six) hours.   oxyCODONE 5 MG immediate release tablet Commonly known as:  Oxy IR/ROXICODONE Take 1 tablet (5 mg total) by mouth every 4 (four) hours as needed (pain scale 4-7).   prenatal multivitamin Tabs tablet Take 1 tablet by mouth daily at 12 noon.   senna-docusate 8.6-50 MG tablet Commonly known as:  Senokot-S Take 2 tablets by mouth at bedtime as needed for mild constipation.            Discharge Care Instructions        Start     Ordered   11/22/16 0000  ibuprofen (ADVIL,MOTRIN) 600 MG tablet  Every 6 hours     11/22/16 1200   11/22/16  0000  senna-docusate (SENOKOT-S) 8.6-50 MG tablet  At bedtime PRN     11/22/16 1200   11/22/16 0000  oxyCODONE (OXY IR/ROXICODONE) 5 MG immediate release tablet  Every 4 hours PRN     11/22/16 1200   11/20/16 0000  OB RESULT CONSOLE Group B Strep    Comments:  This external order was created through the Results Console.   11/20/16 1708   11/20/16 0000  OB RESULTS CONSOLE GC/Chlamydia    Comments:  This external order was created through the Results Console.   11/20/16 1708      Diet: routine diet  Activity: Advance as tolerated. Pelvic rest for 6 weeks.   Outpatient follow up:4 weeks Follow up Appt:No future appointments. Follow up Visit:  Follow-up Information    [facility]. Schedule an appointment as soon as possible for a visit.   Specialty:  Obstetrics and Gynecology Why:  For postpartum visit in 4 weeks Contact information: [address] [place] [address] [phone]          Postpartum contraception: Undecided  Newborn Data: Live born female  Birth Weight: 5 lb 11.4 oz (2590 g) APGAR: 10, 10  Baby Feeding: Bottle and Breast Disposition:home with mother   11/22/2016 Caryl Ada, DO

## 2016-11-22 NOTE — Discharge Instructions (Signed)

## 2016-11-26 ENCOUNTER — Encounter: Payer: 59 | Admitting: Family Medicine

## 2016-12-02 NOTE — Telephone Encounter (Signed)
Patient delivered on 9/27.

## 2016-12-25 ENCOUNTER — Encounter: Payer: Self-pay | Admitting: Obstetrics and Gynecology

## 2016-12-25 ENCOUNTER — Ambulatory Visit (INDEPENDENT_AMBULATORY_CARE_PROVIDER_SITE_OTHER): Payer: 59 | Admitting: Obstetrics and Gynecology

## 2016-12-25 DIAGNOSIS — Z1389 Encounter for screening for other disorder: Secondary | ICD-10-CM

## 2016-12-25 DIAGNOSIS — O0990 Supervision of high risk pregnancy, unspecified, unspecified trimester: Secondary | ICD-10-CM

## 2016-12-25 NOTE — Patient Instructions (Addendum)
Contraception Choices Contraception (birth control) is the use of any methods or devices to prevent pregnancy. Below are some methods to help avoid pregnancy. Hormonal methods  Contraceptive implant. This is a thin, plastic tube containing progesterone hormone. It does not contain estrogen hormone. Your health care provider inserts the tube in the inner part of the upper arm. The tube can remain in place for up to 3 years. After 3 years, the implant must be removed. The implant prevents the ovaries from releasing an egg (ovulation), thickens the cervical mucus to prevent sperm from entering the uterus, and thins the lining of the inside of the uterus.  Progesterone-only injections. These injections are given every 3 months by your health care provider to prevent pregnancy. This synthetic progesterone hormone stops the ovaries from releasing eggs. It also thickens cervical mucus and changes the uterine lining. This makes it harder for sperm to survive in the uterus.  Birth control pills. These pills contain estrogen and progesterone hormone. They work by preventing the ovaries from releasing eggs (ovulation). They also cause the cervical mucus to thicken, preventing the sperm from entering the uterus. Birth control pills are prescribed by a health care provider.Birth control pills can also be used to treat heavy periods.  Minipill. This type of birth control pill contains only the progesterone hormone. They are taken every day of each month and must be prescribed by your health care provider.  Birth control patch. The patch contains hormones similar to those in birth control pills. It must be changed once a week and is prescribed by a health care provider.  Vaginal ring. The ring contains hormones similar to those in birth control pills. It is left in the vagina for 3 weeks, removed for 1 week, and then a new one is put back in place. The patient must be comfortable inserting and removing the ring from  the vagina.A health care provider's prescription is necessary.  Emergency contraception. Emergency contraceptives prevent pregnancy after unprotected sexual intercourse. This pill can be taken right after sex or up to 5 days after unprotected sex. It is most effective the sooner you take the pills after having sexual intercourse. Most emergency contraceptive pills are available without a prescription. Check with your pharmacist. Do not use emergency contraception as your only form of birth control. Barrier methods  Female condom. This is a thin sheath (latex or rubber) that is worn over the penis during sexual intercourse. It can be used with spermicide to increase effectiveness.  Female condom. This is a soft, loose-fitting sheath that is put into the vagina before sexual intercourse.  Diaphragm. This is a soft, latex, dome-shaped barrier that must be fitted by a health care provider. It is inserted into the vagina, along with a spermicidal jelly. It is inserted before intercourse. The diaphragm should be left in the vagina for 6 to 8 hours after intercourse.  Cervical cap. This is a round, soft, latex or plastic cup that fits over the cervix and must be fitted by a health care provider. The cap can be left in place for up to 48 hours after intercourse.  Sponge. This is a soft, circular piece of polyurethane foam. The sponge has spermicide in it. It is inserted into the vagina after wetting it and before sexual intercourse.  Spermicides. These are chemicals that kill or block sperm from entering the cervix and uterus. They come in the form of creams, jellies, suppositories, foam, or tablets. They do not require a prescription. They   are inserted into the vagina with an applicator before having sexual intercourse. The process must be repeated every time you have sexual intercourse. Intrauterine contraception  Intrauterine device (IUD). This is a T-shaped device that is put in a woman's uterus during  a menstrual period to prevent pregnancy. There are 2 types: ? Copper IUD. This type of IUD is wrapped in copper wire and is placed inside the uterus. Copper makes the uterus and fallopian tubes produce a fluid that kills sperm. It can stay in place for 10 years. ? Hormone IUD. This type of IUD contains the hormone progestin (synthetic progesterone). The hormone thickens the cervical mucus and prevents sperm from entering the uterus, and it also thins the uterine lining to prevent implantation of a fertilized egg. The hormone can weaken or kill the sperm that get into the uterus. It can stay in place for 3-5 years, depending on which type of IUD is used. Permanent methods of contraception  Female tubal ligation. This is when the woman's fallopian tubes are surgically sealed, tied, or blocked to prevent the egg from traveling to the uterus.  Hysteroscopic sterilization. This involves placing a small coil or insert into each fallopian tube. Your doctor uses a technique called hysteroscopy to do the procedure. The device causes scar tissue to form. This results in permanent blockage of the fallopian tubes, so the sperm cannot fertilize the egg. It takes about 3 months after the procedure for the tubes to become blocked. You must use another form of birth control for these 3 months.  Female sterilization. This is when the female has the tubes that carry sperm tied off (vasectomy).This blocks sperm from entering the vagina during sexual intercourse. After the procedure, the man can still ejaculate fluid (semen). Natural planning methods  Natural family planning. This is not having sexual intercourse or using a barrier method (condom, diaphragm, cervical cap) on days the woman could become pregnant.  Calendar method. This is keeping track of the length of each menstrual cycle and identifying when you are fertile.  Ovulation method. This is avoiding sexual intercourse during ovulation.  Symptothermal method.  This is avoiding sexual intercourse during ovulation, using a thermometer and ovulation symptoms.  Post-ovulation method. This is timing sexual intercourse after you have ovulated. Regardless of which type or method of contraception you choose, it is important that you use condoms to protect against the transmission of sexually transmitted infections (STIs). Talk with your health care provider about which form of contraception is most appropriate for you. This information is not intended to replace advice given to you by your health care provider. Make sure you discuss any questions you have with your health care provider. Document Released: 02/10/2005 Document Revised: 07/19/2015 Document Reviewed: 08/05/2012 Elsevier Interactive Patient Education  2017 [employer]vier Inc.  

## 2016-12-25 NOTE — Progress Notes (Signed)
Subjective:     Sherri Shepherd is a 26 y.o. female who presents for a postpartum visit. She is 5 weeks postpartum following a low cervical transverse Cesarean section. I have fully reviewed the prenatal and intrapartum course. The delivery was at 36 gestational weeks. Outcome: primary cesarean section, low transverse incision. Anesthesia: spinal. Postpartum course has been uncomplicated. Baby's course has been uncomplicated. Baby is feeding by bottle - gerber gentle. Bleeding no bleeding. Bowel function is normal. Bladder function is normal. Patient is sexually active. Contraception method is abstinence. Postpartum depression screening: negative.     Review of Systems Pertinent items are noted in HPI.   Objective:    BP 114/74   Pulse 68   Wt 126 lb 2 oz (57.2 kg)   Breastfeeding? No   BMI 20.99 kg/m   General:  alert, cooperative and no distress   Breasts:  inspection negative, no nipple discharge or bleeding, no masses or nodularity palpable  Lungs: clear to auscultation bilaterally  Heart:  regular rate and rhythm  Abdomen: soft, non-tender; bowel sounds normal; no masses,  no organomegaly   Vulva:  normal  Vagina: normal vagina, no discharge, exudate, lesion, or erythema  Cervix:  multiparous appearance  Corpus: normal size, contour, position, consistency, mobility, non-tender  Adnexa:  no mass, fullness, tenderness  Rectal Exam: Not performed.        Assessment:     Normal postpartum exam. Pap smear not done at today's visit.   Plan:    1. Contraception: none Patient is undecided and is considering depo-provera vs Nexplanon (both option she has tried before) 2. Patient is medically cleared to return to all activities of daily living 3. Follow up in: 6 months for annual exam or as needed for contraception.

## 2017-06-04 ENCOUNTER — Ambulatory Visit (INDEPENDENT_AMBULATORY_CARE_PROVIDER_SITE_OTHER): Payer: Medicaid Other | Admitting: General Practice

## 2017-06-04 DIAGNOSIS — Z3202 Encounter for pregnancy test, result negative: Secondary | ICD-10-CM

## 2017-06-04 LAB — POCT PREGNANCY, URINE: Preg Test, Ur: NEGATIVE

## 2017-06-04 NOTE — Progress Notes (Signed)
I have reviewed the chart and agree with nursing staff's documentation of this patient's encounter.  Tenzin Pavon, MD 06/04/2017 1:17 PM    

## 2017-06-04 NOTE — Progress Notes (Signed)
Patient here for UPT today. UPT -. Patient states she was concerned about pregnancy because she got her LMP 04/27/17 & 05/21/17. Patient will come back in a few weeks for annual exam/pap smear.

## 2017-07-06 ENCOUNTER — Ambulatory Visit: Payer: Medicaid Other | Admitting: Advanced Practice Midwife

## 2017-07-06 NOTE — Progress Notes (Signed)
Per Heather Hogan, CNM pt does not need to be contacted in regards to missed appt.  

## 2017-08-02 ENCOUNTER — Encounter (HOSPITAL_COMMUNITY): Payer: Self-pay | Admitting: *Deleted

## 2017-08-02 ENCOUNTER — Inpatient Hospital Stay (HOSPITAL_COMMUNITY)
Admission: AD | Admit: 2017-08-02 | Discharge: 2017-08-02 | Disposition: A | Discharge status: Home / Self Care | Payer: Medicaid Other | Source: Ambulatory Visit | Attending: Obstetrics & Gynecology | Admitting: Obstetrics & Gynecology

## 2017-08-02 DIAGNOSIS — Z3202 Encounter for pregnancy test, result negative: Secondary | ICD-10-CM | POA: Insufficient documentation

## 2017-08-02 DIAGNOSIS — N923 Ovulation bleeding: Secondary | ICD-10-CM | POA: Diagnosis not present

## 2017-08-02 DIAGNOSIS — B379 Candidiasis, unspecified: Secondary | ICD-10-CM | POA: Diagnosis not present

## 2017-08-02 DIAGNOSIS — B373 Candidiasis of vulva and vagina: Secondary | ICD-10-CM | POA: Diagnosis not present

## 2017-08-02 DIAGNOSIS — N939 Abnormal uterine and vaginal bleeding, unspecified: Secondary | ICD-10-CM | POA: Insufficient documentation

## 2017-08-02 LAB — WET PREP, GENITAL
CLUE CELLS WET PREP: NONE SEEN
Sperm: NONE SEEN
Trich, Wet Prep: NONE SEEN

## 2017-08-02 LAB — URINALYSIS, ROUTINE W REFLEX MICROSCOPIC
Bilirubin Urine: NEGATIVE
Glucose, UA: NEGATIVE mg/dL
KETONES UR: NEGATIVE mg/dL
Nitrite: NEGATIVE
PH: 5 (ref 5.0–8.0)
Protein, ur: NEGATIVE mg/dL
Specific Gravity, Urine: 1.029 (ref 1.005–1.030)

## 2017-08-02 LAB — POCT PREGNANCY, URINE: Preg Test, Ur: NEGATIVE

## 2017-08-02 MED ORDER — FLUCONAZOLE 150 MG PO TABS: 150.0000 mg | ORAL_TABLET | Freq: Once | ORAL | 0 refills | Status: AC

## 2017-08-02 NOTE — MAU Provider Note (Signed)
History     CSN: [id number]  Arrival date and time: 08/02/17 0900   First Provider Initiated Contact with Patient 08/02/17 1011      Chief Complaint  Patient presents with  . Vaginal Bleeding  . Nausea   HPI  [name] is a 27 y.o. female [phone] [id number] non pregnant here in MAU with vaginal spotting that started yesterday around 1600. She noticed it in the toilet and on the tissue. The spotting was pink in color. Last intercourse was 2 days ago. No new sexual partner. History of STI from this partner. Says they were both treated. Denies pain at this time. Some mild nausea. Has not taken any new medications.    OB History    Gravida  3   Para  3   Term  0   Preterm  3   AB  0   Living  2     SAB  0   TAB  0   Ectopic  0   Multiple  0   Live Births  1           Past Medical History:  Diagnosis Date  . Chlamydia Feb 2017  . H/O varicella   . Headache in pregnancy, antepartum, second trimester 06/25/2016  . Premature dilatation of cervix during pregnancy 10/01/2011   5/90%/-1  . Preterm delivery 12/04/2011   SVD at 3522w3d on 10/22/11    Past Surgical History:  Procedure Laterality Date  . CERVICAL CERCLAGE N/A 08/09/2016   Procedure: CERCLAGE CERVICAL;  Surgeon: Catalina Antiguaonstant, Peggy, MD;  Location: [facility];  Service: Gynecology;  Laterality: N/A;  . CESAREAN SECTION N/A 11/20/2016   Procedure: CESAREAN SECTION;  Surgeon: Willodean RosenthalHarraway-Smith, Carolyn, MD;  [facility];  Service: Obstetrics;  Laterality: N/A;    Family History  Problem Relation Age of Onset  . Hypertension Mother   . Diabetes Brother   . Cancer Maternal Grandmother        LUNG  . Diabetes Maternal Grandfather   . Arthritis Paternal Grandmother   . Cancer Paternal Grandmother        BREAST  . Diabetes Maternal Uncle     Social History   Tobacco Use  . Smoking status: Never Smoker  . Smokeless tobacco: Never Used  Substance Use Topics  . Alcohol use: No  . Drug use: No     Allergies: No Known Allergies  Medications Prior to Admission  Medication Sig Dispense Refill Last Dose  . acetaminophen (TYLENOL) 500 MG tablet Take 1,000 mg by mouth every 6 (six) hours as needed for headache.    Taking  . ibuprofen (ADVIL,MOTRIN) 600 MG tablet Take 1 tablet (600 mg total) by mouth every 6 (six) hours. 30 tablet 0 Taking  . senna-docusate (SENOKOT-S) 8.6-50 MG tablet Take 2 tablets by mouth at bedtime as needed for mild constipation. 30 tablet 0 Taking   Results for orders placed or performed during the hospital encounter of 08/02/17 (from the past 48 hour(s))  Urinalysis, Routine w reflex microscopic     Status: Abnormal   Collection Time: 08/02/17  9:31 AM  Result Value Ref Range   Color, Urine YELLOW YELLOW   APPearance HAZY (A) CLEAR   Specific Gravity, Urine 1.029 1.005 - 1.030   pH 5.0 5.0 - 8.0   Glucose, UA NEGATIVE NEGATIVE mg/dL   Hgb urine dipstick MODERATE (A) NEGATIVE   Bilirubin Urine NEGATIVE NEGATIVE   Ketones, ur NEGATIVE NEGATIVE mg/dL   Protein, ur NEGATIVE  NEGATIVE mg/dL   Nitrite NEGATIVE NEGATIVE   Leukocytes, UA SMALL (A) NEGATIVE   RBC / HPF 0-5 0 - 5 RBC/hpf   WBC, UA 6-10 0 - 5 WBC/hpf   Bacteria, UA RARE (A) NONE SEEN   Squamous Epithelial / LPF 0-5 0 - 5   Mucus PRESENT     Comment: Performed at [facility], [address], [place], [place] [address]  Pregnancy, urine POC     Status: None   Collection Time: 08/02/17  9:40 AM  Result Value Ref Range   Preg Test, Ur NEGATIVE NEGATIVE    Comment:        THE SENSITIVITY OF THIS METHODOLOGY IS >24 mIU/mL   Wet prep, genital     Status: Abnormal   Collection Time: 08/02/17  9:55 AM  Result Value Ref Range   Yeast Wet Prep HPF POC PRESENT (A) NONE SEEN   Trich, Wet Prep NONE SEEN NONE SEEN   Clue Cells Wet Prep HPF POC NONE SEEN NONE SEEN   WBC, Wet Prep HPF POC MANY (A) NONE SEEN    Comment: MANY BACTERIA SEEN   Sperm NONE SEEN     Comment: Performed at [facility], [address], [place], [place] [address]   Review of Systems  Constitutional: Negative for fever.  Gastrointestinal: Negative for abdominal pain.  Genitourinary: Positive for vaginal bleeding and vaginal discharge. Negative for dysuria.   Physical Exam   Blood pressure 118/87, pulse 83, temperature 97.6 F (36.4 C), temperature source Oral, resp. rate 16, height 5\' 5"  (1.651 m), weight 113 lb 0.6 oz (51.3 kg), last menstrual period 07/16/2017, SpO2 98 %, not currently breastfeeding.  Physical Exam  Constitutional: She is oriented to person, place, and time. She appears well-developed and well-nourished. No distress.  HENT:  Head: Normocephalic.  Eyes: Pupils are equal, round, and reactive to light.  GI: Soft. She exhibits no distension. There is no tenderness. There is no rebound and no guarding.  Genitourinary:  Genitourinary Comments: Wet prep and GC collected by RN   Musculoskeletal: Normal range of motion.  Neurological: She is alert and oriented to person, place, and time.  Skin: Skin is warm. She is not diaphoretic.  Psychiatric: Her behavior is normal.    MAU Course  Procedures  None  MDM  Wet prep & GC UA Urine pregnancy test negative   Assessment and Plan   A:  1. Spotting between menses   2. Yeast infection     P:  Discharge home in stable condition Rx: Diflucan Return to MAU for emergencies only Establish care with GYN Condoms only  Rasch, Harolyn Rutherford, NP 08/02/2017 11:09 AM

## 2017-08-02 NOTE — Progress Notes (Addendum)
D/c instructions given with pt understanding.   1029: Pt left unit via ambulatory with px in hand.

## 2017-08-02 NOTE — MAU Note (Addendum)
Patient c/o  +vaginal bleeding Spotting pink in color Not having to wear a pad Started yesterday Does report vaginal irritation but denies discharge.   Denies pain  +nausea/vomiting Vomiting yesterday at dinner time but not today  LMP 07/16/17

## 2017-08-02 NOTE — Discharge Instructions (Signed)

## 2017-08-03 LAB — GC/CHLAMYDIA PROBE AMP (~~LOC~~) NOT AT ARMC
CHLAMYDIA, DNA PROBE: NEGATIVE
NEISSERIA GONORRHEA: NEGATIVE

## 2017-08-28 ENCOUNTER — Ambulatory Visit: Payer: Medicaid Other

## 2017-09-07 ENCOUNTER — Other Ambulatory Visit: Payer: Self-pay

## 2017-09-07 ENCOUNTER — Encounter: Payer: Self-pay | Admitting: Medical

## 2017-09-07 ENCOUNTER — Inpatient Hospital Stay (HOSPITAL_COMMUNITY)
Admission: AD | Admit: 2017-09-07 | Discharge: 2017-09-07 | Discharge status: Against Medical Advice | Payer: Medicaid Other | Source: Ambulatory Visit | Attending: Obstetrics and Gynecology | Admitting: Obstetrics and Gynecology

## 2017-09-07 ENCOUNTER — Encounter (HOSPITAL_COMMUNITY): Payer: Self-pay | Admitting: Medical

## 2017-09-07 ENCOUNTER — Ambulatory Visit (INDEPENDENT_AMBULATORY_CARE_PROVIDER_SITE_OTHER): Payer: Medicaid Other | Admitting: Medical

## 2017-09-07 VITALS — BP 109/75 | HR 72 | Wt 112.5 lb

## 2017-09-07 DIAGNOSIS — Z3201 Encounter for pregnancy test, result positive: Secondary | ICD-10-CM | POA: Diagnosis present

## 2017-09-07 DIAGNOSIS — O26891 Other specified pregnancy related conditions, first trimester: Secondary | ICD-10-CM | POA: Diagnosis not present

## 2017-09-07 DIAGNOSIS — R109 Unspecified abdominal pain: Secondary | ICD-10-CM | POA: Diagnosis not present

## 2017-09-07 DIAGNOSIS — Z3A01 Less than 8 weeks gestation of pregnancy: Secondary | ICD-10-CM | POA: Diagnosis not present

## 2017-09-07 DIAGNOSIS — Z331 Pregnant state, incidental: Secondary | ICD-10-CM

## 2017-09-07 DIAGNOSIS — O26899 Other specified pregnancy related conditions, unspecified trimester: Secondary | ICD-10-CM

## 2017-09-07 DIAGNOSIS — Z01419 Encounter for gynecological examination (general) (routine) without abnormal findings: Secondary | ICD-10-CM

## 2017-09-07 LAB — POCT URINE PREGNANCY: PREG TEST UR: POSITIVE — AB

## 2017-09-07 NOTE — MAU Note (Signed)
Pt not in lobby.  

## 2017-09-07 NOTE — MAU Note (Signed)
Pt presents with c/o abdominal cramping, denies VB.  Had +UPT today in clinic. Instructed to come to MAU for eval of cramping

## 2017-09-07 NOTE — MAU Note (Signed)
NOT IN LOBBY 

## 2017-09-07 NOTE — Progress Notes (Signed)
Abdominal  cramping x 2 weeks

## 2017-09-07 NOTE — Patient Instructions (Addendum)
First Trimester of Pregnancy The first trimester of pregnancy is from week 1 until the end of week 13 (months 1 through 3). During this time, your baby will begin to develop inside you. At 6-8 weeks, the eyes and face are formed, and the heartbeat can be seen on ultrasound. At the end of 12 weeks, all the baby's organs are formed. Prenatal care is all the medical care you receive before the birth of your baby. Make sure you get good prenatal care and follow all of your doctor's instructions. Follow these instructions at home: Medicines  Take over-the-counter and prescription medicines only as told by your doctor. Some medicines are safe and some medicines are not safe during pregnancy.  Take a prenatal vitamin that contains at least 600 micrograms (mcg) of folic acid.  If you have trouble pooping (constipation), take medicine that will make your stool soft (stool softener) if your doctor approves. Eating and drinking  Eat regular, healthy meals.  Your doctor will tell you the amount of weight gain that is right for you.  Avoid raw meat and uncooked cheese.  If you feel sick to your stomach (nauseous) or throw up (vomit): ? Eat 4 or 5 small meals a day instead of 3 large meals. ? Try eating a few soda crackers. ? Drink liquids between meals instead of during meals.  To prevent constipation: ? Eat foods that are high in fiber, like fresh fruits and vegetables, whole grains, and beans. ? Drink enough fluids to keep your pee (urine) clear or pale yellow. Activity  Exercise only as told by your doctor. Stop exercising if you have cramps or pain in your lower belly (abdomen) or low back.  Do not exercise if it is too hot, too humid, or if you are in a place of great height (high altitude).  Try to avoid standing for long periods of time. Move your legs often if you must stand in one place for a long time.  Avoid heavy lifting.  Wear low-heeled shoes. Sit and stand up straight.  You  can have sex unless your doctor tells you not to. Relieving pain and discomfort  Wear a good support bra if your breasts are sore.  Take warm water baths (sitz baths) to soothe pain or discomfort caused by hemorrhoids. Use hemorrhoid cream if your doctor says it is okay.  Rest with your legs raised if you have leg cramps or low back pain.  If you have puffy, bulging veins (varicose veins) in your legs: ? Wear support hose or compression stockings as told by your doctor. ? Raise (elevate) your feet for 15 minutes, 3-4 times a day. ? Limit salt in your food. Prenatal care  Schedule your prenatal visits by the twelfth week of pregnancy.  Write down your questions. Take them to your prenatal visits.  Keep all your prenatal visits as told by your doctor. This is important. Safety  Wear your seat belt at all times when driving.  Make a list of emergency phone numbers. The list should include numbers for family, friends, the hospital, and police and fire departments. General instructions  Ask your doctor for a referral to a local prenatal class. Begin classes no later than at the start of month 6 of your pregnancy.  Ask for help if you need counseling or if you need help with nutrition. Your doctor can give you advice or tell you where to go for help.  Do not use hot tubs, steam rooms, or   saunas.  Do not douche or use tampons or scented sanitary pads.  Do not cross your legs for long periods of time.  Avoid all herbs and alcohol. Avoid drugs that are not approved by your doctor.  Do not use any tobacco products, including cigarettes, chewing tobacco, and electronic cigarettes. If you need help quitting, ask your doctor. You may get counseling or other support to help you quit.  Avoid cat litter boxes and soil used by cats. These carry germs that can cause birth defects in the baby and can cause a loss of your baby (miscarriage) or stillbirth.  Visit your dentist. At home, brush  your teeth with a soft toothbrush. Be gentle when you floss. Contact a doctor if:  You are dizzy.  You have mild cramps or pressure in your lower belly.  You have a nagging pain in your belly area.  You continue to feel sick to your stomach, you throw up, or you have watery poop (diarrhea).  You have a bad smelling fluid coming from your vagina.  You have pain when you pee (urinate).  You have increased puffiness (swelling) in your face, hands, legs, or ankles. Get help right away if:  You have a fever.  You are leaking fluid from your vagina.  You have spotting or bleeding from your vagina.  You have very bad belly cramping or pain.  You gain or lose weight rapidly.  You throw up blood. It may look like coffee grounds.  You are around people who have German measles, fifth disease, or chickenpox.  You have a very bad headache.  You have shortness of breath.  You have any kind of trauma, such as from a fall or a car accident. Summary  The first trimester of pregnancy is from week 1 until the end of week 13 (months 1 through 3).  To take care of yourself and your unborn baby, you will need to eat healthy meals, take medicines only if your doctor tells you to do so, and do activities that are safe for you and your baby.  Keep all follow-up visits as told by your doctor. This is important as your doctor will have to ensure that your baby is healthy and growing well. This information is not intended to replace advice given to you by your health care provider. Make sure you discuss any questions you have with your health care provider. Document Released: 07/30/2007 Document Revised: 02/19/2016 Document Reviewed: 02/19/2016 Elsevier Interactive Patient Education  2017 [employer] Inc.  

## 2017-09-07 NOTE — Progress Notes (Signed)
[name] is a 27 y.o. [phone] [id number] at [facility] [place] who presented to [facility] today for an annual exam. Last pap smear was 05/2016 and normal. She states lower abdominal cramping x 2 weeks. She is late for her period, but states negative HPT x 2 recently. UPT today is positive. Due to cramping patient will be sent to MAU for further evaluation for pregnancy location. Patient voiced understanding of need for further evaluation and report called to Sherri SowEve Key, NP.   Sherri Shepherd, Sherri N, PA-C 09/07/2017 6:27 PM

## 2017-09-08 ENCOUNTER — Inpatient Hospital Stay (HOSPITAL_COMMUNITY)
Admission: AD | Admit: 2017-09-08 | Discharge: 2017-09-08 | Disposition: A | Discharge status: Home / Self Care | Payer: Medicaid Other | Source: Ambulatory Visit | Attending: Obstetrics & Gynecology | Admitting: Obstetrics & Gynecology

## 2017-09-08 ENCOUNTER — Inpatient Hospital Stay (HOSPITAL_COMMUNITY): Payer: Medicaid Other

## 2017-09-08 DIAGNOSIS — R109 Unspecified abdominal pain: Secondary | ICD-10-CM | POA: Diagnosis not present

## 2017-09-08 DIAGNOSIS — O26891 Other specified pregnancy related conditions, first trimester: Secondary | ICD-10-CM

## 2017-09-08 DIAGNOSIS — Z3401 Encounter for supervision of normal first pregnancy, first trimester: Secondary | ICD-10-CM

## 2017-09-08 DIAGNOSIS — Z3A01 Less than 8 weeks gestation of pregnancy: Secondary | ICD-10-CM | POA: Diagnosis not present

## 2017-09-08 DIAGNOSIS — Z79899 Other long term (current) drug therapy: Secondary | ICD-10-CM | POA: Insufficient documentation

## 2017-09-08 LAB — COMPREHENSIVE METABOLIC PANEL WITH GFR
ALT: 15 U/L (ref 0–44)
AST: 18 U/L (ref 15–41)
Albumin: 4.1 g/dL (ref 3.5–5.0)
Alkaline Phosphatase: 98 U/L (ref 38–126)
Anion gap: 8 (ref 5–15)
BUN: 10 mg/dL (ref 6–20)
CO2: 23 mmol/L (ref 22–32)
Calcium: 9.1 mg/dL (ref 8.9–10.3)
Chloride: 103 mmol/L (ref 98–111)
Creatinine, Ser: 0.61 mg/dL (ref 0.44–1.00)
GFR calc Af Amer: >60 mL/min
GFR calc non Af Amer: >60 mL/min
Glucose, Bld: 84 mg/dL (ref 70–99)
Potassium: 3.8 mmol/L (ref 3.5–5.1)
Sodium: 134 mmol/L — ABNORMAL LOW (ref 135–145)
Total Bilirubin: 0.8 mg/dL (ref 0.3–1.2)
Total Protein: 7.2 g/dL (ref 6.5–8.1)

## 2017-09-08 LAB — CBC
HCT: 39.6 % (ref 36.0–46.0)
Hemoglobin: 13.3 g/dL (ref 12.0–15.0)
MCH: 30.4 pg (ref 26.0–34.0)
MCHC: 33.6 g/dL (ref 30.0–36.0)
MCV: 90.4 fL (ref 78.0–100.0)
Platelets: 225 K/uL (ref 150–400)
RBC: 4.38 MIL/uL (ref 3.87–5.11)
RDW: 16 % — ABNORMAL HIGH (ref 11.5–15.5)
WBC: 10.2 K/uL (ref 4.0–10.5)

## 2017-09-08 LAB — WET PREP, GENITAL
Clue Cells Wet Prep HPF POC: NONE SEEN
Sperm: NONE SEEN
Trich, Wet Prep: NONE SEEN
Yeast Wet Prep HPF POC: NONE SEEN

## 2017-09-08 LAB — HCG, QUANTITATIVE, PREGNANCY: hCG, Beta Chain, Quant, S: 51903 m[IU]/mL — ABNORMAL HIGH

## 2017-09-08 NOTE — MAU Note (Signed)
Urine in lab 

## 2017-09-08 NOTE — MAU Provider Note (Addendum)
Patient Sherri Shepherd is a 27 y.o. [phone] At 5520w6d here for ectopic work-up. She had a well-[facility] exam [facility] at [facility] and was found to be positive. She reported intermittent cramping for two weeks, denied bleeding or other ob-gyn complaints.  She was sent from clinic for MAU yesterday (7-15) but left after a long wait. She returned today for her appt; she declines physical exam.  She was evaluated in MAU triage room.   History     CSN: [id number]  Arrival date and time: 09/08/17 1428   None     No chief complaint on file.  Abdominal Pain  This is a new problem. The current episode started 1 to 4 weeks ago. The onset quality is sudden. The problem occurs intermittently. The problem has been unchanged. The pain is located in the suprapubic region and generalized abdominal region. The pain is at a severity of 3/10. The quality of the pain is cramping. The abdominal pain does not radiate. Nothing aggravates the pain. The pain is relieved by nothing.  She   OB History    Gravida  4   Para  3   Term  0   Preterm  3   AB  0   Living  2     SAB  0   TAB  0   Ectopic  0   Multiple  0   Live Births  1           Past Medical History:  Diagnosis Date  . Chlamydia Feb 2017  . H/O varicella   . Headache in pregnancy, antepartum, second trimester 06/25/2016  . Premature dilatation of cervix during pregnancy 10/01/2011   5/90%/-1  . Preterm delivery 12/04/2011   SVD at 4928w3d on 10/22/11    Past Surgical History:  Procedure Laterality Date  . CERVICAL CERCLAGE N/A 08/09/2016   Procedure: CERCLAGE CERVICAL;  Surgeon: Catalina Antiguaonstant, Peggy, MD;  Location: [facility];  Service: Gynecology;  Laterality: N/A;  . CESAREAN SECTION N/A 11/20/2016   Procedure: CESAREAN SECTION;  Surgeon: Willodean RosenthalHarraway-Smith, Carolyn, MD;  [facility];  Service: Obstetrics;  Laterality: N/A;    Family History  Problem Relation Age of Onset  . Hypertension Mother   . Diabetes Brother   .  Cancer Maternal Grandmother        LUNG  . Diabetes Maternal Grandfather   . Arthritis Paternal Grandmother   . Cancer Paternal Grandmother        BREAST  . Diabetes Maternal Uncle     Social History   Tobacco Use  . Smoking status: Never Smoker  . Smokeless tobacco: Never Used  Substance Use Topics  . Alcohol use: No  . Drug use: No    Allergies: No Known Allergies  Medications Prior to Admission  Medication Sig Dispense Refill Last Dose  . acetaminophen (TYLENOL) 500 MG tablet Take 1,000 mg by mouth every 6 (six) hours as needed for headache.    Not Taking  . ibuprofen (ADVIL,MOTRIN) 600 MG tablet Take 1 tablet (600 mg total) by mouth every 6 (six) hours. (Patient not taking: Reported on 09/07/2017) 30 tablet 0 Not Taking  . senna-docusate (SENOKOT-S) 8.6-50 MG tablet Take 2 tablets by mouth at bedtime as needed for mild constipation. (Patient not taking: Reported on 09/07/2017) 30 tablet 0 Not Taking    Review of Systems  Constitutional: Negative.   HENT: Negative.   Respiratory: Negative.   Cardiovascular: Negative.   Gastrointestinal: Positive for abdominal pain.  Genitourinary: Negative.   Musculoskeletal: Negative.   Neurological: Negative.   Hematological: Negative.    Physical Exam   Blood pressure 112/70, pulse 75, temperature 98.5 F (36.9 C), temperature source Oral, resp. rate 16, height 5\' 5"  (1.651 m), weight 112 lb (50.8 kg), last menstrual period 08/05/2017, SpO2 100 %, not currently breastfeeding.  Physical Exam  Constitutional: She is oriented to person, place, and time. She appears well-developed and well-nourished.  HENT:  Head: Normocephalic.  Eyes: Pupils are equal, round, and reactive to light.  Neck: Normal range of motion.  Musculoskeletal: Normal range of motion.  Neurological: She is alert and oriented to person, place, and time.  Skin: Skin is warm and dry.  Psychiatric: She has a normal mood and affect.   Patient did not want pelvic  exam.   MAU Course  Procedures  MDM Beta is over 51,000; SIUP visualized.  Blind swabs done in MAU; wet prep was negative.  GC pending.   Assessment and Plan   1. Encounter for supervision of normal first pregnancy in first trimester   2. Abdominal pain    2. Ectopic pregnancy excluded by [place].   3. Patient will call [facility] and make prenatal visit.   4. Warning signs and when to return to MAU reviewed.   Charlesetta Garibaldi Myracle Febres 09/08/2017, 5:18 PM

## 2017-09-08 NOTE — MAU Note (Signed)
Pt states she had a positive preg test yesterday and was told to come here then to have bloodwork and an u/s but did not have childcare.

## 2017-09-09 LAB — GC/CHLAMYDIA PROBE AMP (~~LOC~~) NOT AT ARMC
Chlamydia: NEGATIVE
Neisseria Gonorrhea: NEGATIVE

## 2017-09-30 ENCOUNTER — Ambulatory Visit (INDEPENDENT_AMBULATORY_CARE_PROVIDER_SITE_OTHER): Payer: Medicaid Other | Admitting: Obstetrics and Gynecology

## 2017-09-30 ENCOUNTER — Encounter: Payer: Self-pay | Admitting: Obstetrics and Gynecology

## 2017-09-30 DIAGNOSIS — O3430 Maternal care for cervical incompetence, unspecified trimester: Secondary | ICD-10-CM | POA: Insufficient documentation

## 2017-09-30 DIAGNOSIS — O3680X0 Pregnancy with inconclusive fetal viability, not applicable or unspecified: Secondary | ICD-10-CM

## 2017-09-30 NOTE — Progress Notes (Signed)
OB Note Patient had 7/16 for ectopic rule out that showed 5/5wks based on CRL with FHR of 92. No SAB s/s. Bedside transabdominal u/s showed a YS but no definite fetal pole and GS looks small. Pt should be 8/6wks based on that u/s or 8/0 based on her LMP, which she states is sure. Will set her up for a viability u/s.  Cornelia Copaharlie Barret Esquivel, Jr MD Attending Center [facility] (Faculty Practice) 09/30/2017 Time: [phone]

## 2017-09-30 NOTE — Progress Notes (Signed)
OB US scheduled for 10/02/17 @ 1445.  Pt notified.

## 2017-10-01 ENCOUNTER — Ambulatory Visit (INDEPENDENT_AMBULATORY_CARE_PROVIDER_SITE_OTHER): Payer: Medicaid Other

## 2017-10-01 ENCOUNTER — Ambulatory Visit (HOSPITAL_COMMUNITY)
Admission: RE | Admit: 2017-10-01 | Discharge: 2017-10-01 | Disposition: A | Discharge status: Home / Self Care | Payer: Medicaid Other | Source: Ambulatory Visit | Attending: Obstetrics and Gynecology | Admitting: Obstetrics and Gynecology

## 2017-10-01 DIAGNOSIS — O3680X0 Pregnancy with inconclusive fetal viability, not applicable or unspecified: Secondary | ICD-10-CM | POA: Diagnosis not present

## 2017-10-01 DIAGNOSIS — Z3A09 9 weeks gestation of pregnancy: Secondary | ICD-10-CM | POA: Diagnosis not present

## 2017-10-01 DIAGNOSIS — Z712 Person consulting for explanation of examination or test findings: Secondary | ICD-10-CM

## 2017-10-01 NOTE — Progress Notes (Signed)
Chart reviewed - agree with RN documentation.   

## 2017-10-01 NOTE — Progress Notes (Signed)
Pt here today for OB US results.  Due to pt having to wait long for radiology report pt needed to leave due to transportation.  I asked pt if it was okay that I give a call for results.  Pt stated that it would be okay to call with results.  Called pt and informed her that her US showed that she has a good pregnancy and FHR is 171 which has increased from her last US.  Pt stated thank you and that she will come in to pick up US pics and schedule a NEW OB appt.

## 2017-10-02 ENCOUNTER — Ambulatory Visit (HOSPITAL_COMMUNITY): Payer: Medicaid Other

## 2017-10-20 ENCOUNTER — Encounter: Payer: Medicaid Other | Admitting: Advanced Practice Midwife

## 2017-10-20 ENCOUNTER — Telehealth: Payer: Self-pay | Admitting: Medical

## 2017-10-20 NOTE — BH Specialist Note (Deleted)
Integrated Behavioral Health Initial Visit  MRN: [id number] Name: Lenell AntuJasmine J Matera  Number of Integrated Behavioral Health Clinician visits:: 1/6 Session Start time: ***  Session End time: *** Total time: {IBH Total Time:21014050}  Type of Service: Integrated Behavioral Health- Individual/Family Interpretor:No. Interpretor Name and Language: n/a   Warm Hand Off Completed.       SUBJECTIVE: Lenell AntuJasmine J Topel is a [age] y.o. female accompanied by {CHL AMB ACCOMPANIED WU:[id number]}BY:[phone]} Patient was referred by Sharen CounterLisa Leftwich-Kirby, CNM for initial OB introduction to integrated behavioral health services. Patient reports the following symptoms/concerns: *** Duration of problem: ***; Severity of problem: {Mild/Moderate/Severe:20260}  OBJECTIVE: Mood: {BHH MOOD:22306} and Affect: {BHH AFFECT:22307} Risk of harm to self or others: {CHL AMB BH Suicide Current Mental Status:21022748}  LIFE CONTEXT: Family and Social: *** School/Work: *** Self-Care: *** Life Changes: Current pregnancy ***  GOALS ADDRESSED: Patient will: 1. Reduce symptoms of: {IBH Symptoms:21014056} 2. Increase knowledge and/or ability of: {IBH Patient Tools:21014057}  3. Demonstrate ability to: {IBH Goals:21014053}  INTERVENTIONS: Interventions utilized: {IBH Interventions:21014054}  Standardized Assessments completed: GAD-7 and PHQ 9  ASSESSMENT: Patient currently experiencing Supervision of *** pregnancy, antepartum.   Patient may benefit from Initial OB introduction to integrated behavioral health services .  PLAN: 1. Follow up with behavioral health clinician on : *** 2. Behavioral recommendations: *** 3. Referral(s): {IBH Referrals:21014055} 4. "From scale of 1-10, how likely are you to follow plan?": ***  Rae LipsJamie C Saydi Kobel, LCSW  Depression screen [facility] 2/9 09/30/2017 12/25/2016 10/28/2016 09/15/2016 08/26/2016  Decreased Interest 2 0 1 1 0  Down, Depressed, Hopeless 0 0 0 0 0  PHQ - 2 Score 2 0 1 1 0   Altered sleeping 0 0 0 0 0  Tired, decreased energy 0 0 1 2 2   Change in appetite 0 0 0 0 2  Feeling bad or failure about yourself  0 0 0 0 0  Trouble concentrating 0 0 0 0 0  Moving slowly or fidgety/restless 0 0 0 0 0  Suicidal thoughts 0 0 0 0 0  PHQ-9 Score 2 0 2 3 4    GAD 7 : Generalized Anxiety Score 09/30/2017 12/25/2016 10/28/2016 09/15/2016  Nervous, Anxious, on Edge 0 0 0 0  Control/stop worrying 0 0 0 0  Worry too much - different things 0 0 0 0  Trouble relaxing 0 0 0 0  Restless 0 0 0 0  Easily annoyed or irritable 0 0 0 2  Afraid - awful might happen 0 0 0 0  Total GAD 7 Score 0 0 0 2    ***

## 2017-10-20 NOTE — Telephone Encounter (Signed)
Patient called to say she had no way to get here, and needed to reschedule. We were cut off before I could get her rescheduled. Will call and give her the appointment.

## 2017-10-23 ENCOUNTER — Encounter: Payer: Medicaid Other | Admitting: Obstetrics and Gynecology

## 2017-11-06 NOTE — BH Specialist Note (Signed)
Integrated Behavioral Health Initial Visit  MRN: [id number] Name: Sherri Shepherd  Number of Integrated Behavioral Health Clinician visits:: 1/6 Session Start time: 11:20  Session End time: 11:26 Total time: 15 minutes  Type of Service: Integrated Behavioral Health- Individual/Family Interpretor:No. Interpretor Name and Language: n/a   Warm Hand Off Completed.       SUBJECTIVE: Sherri Shepherd is a 27 y.o. female accompanied by 68mo son and Partner/Significant Other Patient was referred by Candelaria CelesteJacob Stinson, DO for Initial OB introduction to integrated behavioral health services . Patient reports the following symptoms/concerns: Pt states her only concern is wanting to know if eating a lot is normal during early pregnancy, after eating more than usual last night.  Duration of problem: Current pregnancy; Severity of problem: n/a  OBJECTIVE: Mood: Normal and Affect: Appropriate Risk of harm to self or others: No plan to harm self or others  LIFE CONTEXT: Family and Social: - School/Work: - Self-Care: - Life Changes: Current pregnancy   INTERVENTIONS: Standardized Assessments completed: GAD-7 and PHQ 9  ASSESSMENT: Patient currently experiencing Supervision of high risk pregnancy, antepartum, second trimester.   Patient may benefit from Initial OB introduction to integrated behavioral health services .  PLAN: 1. Follow up with behavioral health clinician on : One month, or earlier, as needed 2. Behavioral recommendations:  -Take prenatal vitamin daily, as recommended by medical provider (may help with any nutritional deficits, that may contribute to food cravings) 3. Referral(s): Integrated Behavioral Health Services (In Clinic) 4. "From scale of 1-10, how likely are you to follow plan?": 10  Rae LipsJamie C Ashara Lounsbury, LCSW  Depression screen [facility] 2/9 09/30/2017 12/25/2016 10/28/2016 09/15/2016 08/26/2016  Decreased Interest 2 0 1 1 0  Down, Depressed, Hopeless 0 0 0 0 0  PHQ - 2  Score 2 0 1 1 0  Altered sleeping 0 0 0 0 0  Tired, decreased energy 0 0 1 2 2   Change in appetite 0 0 0 0 2  Feeling bad or failure about yourself  0 0 0 0 0  Trouble concentrating 0 0 0 0 0  Moving slowly or fidgety/restless 0 0 0 0 0  Suicidal thoughts 0 0 0 0 0  PHQ-9 Score 2 0 2 3 4    GAD 7 : Generalized Anxiety Score 09/30/2017 12/25/2016 10/28/2016 09/15/2016  Nervous, Anxious, on Edge 0 0 0 0  Control/stop worrying 0 0 0 0  Worry too much - different things 0 0 0 0  Trouble relaxing 0 0 0 0  Restless 0 0 0 0  Easily annoyed or irritable 0 0 0 2  Afraid - awful might happen 0 0 0 0  Total GAD 7 Score 0 0 0 2

## 2017-11-09 ENCOUNTER — Other Ambulatory Visit (HOSPITAL_COMMUNITY)
Admission: RE | Admit: 2017-11-09 | Discharge: 2017-11-09 | Disposition: A | Discharge status: Home / Self Care | Payer: Medicaid Other | Source: Ambulatory Visit | Attending: Family Medicine | Admitting: Family Medicine

## 2017-11-09 ENCOUNTER — Ambulatory Visit: Payer: Self-pay | Admitting: Clinical

## 2017-11-09 ENCOUNTER — Encounter: Payer: Self-pay | Admitting: Family Medicine

## 2017-11-09 ENCOUNTER — Encounter (HOSPITAL_COMMUNITY): Payer: Self-pay

## 2017-11-09 ENCOUNTER — Ambulatory Visit (INDEPENDENT_AMBULATORY_CARE_PROVIDER_SITE_OTHER): Payer: Medicaid Other | Admitting: Family Medicine

## 2017-11-09 VITALS — BP 115/74 | HR 85 | Wt 123.6 lb

## 2017-11-09 DIAGNOSIS — N898 Other specified noninflammatory disorders of vagina: Secondary | ICD-10-CM

## 2017-11-09 DIAGNOSIS — Z8742 Personal history of other diseases of the female genital tract: Secondary | ICD-10-CM

## 2017-11-09 DIAGNOSIS — O09892 Supervision of other high risk pregnancies, second trimester: Secondary | ICD-10-CM | POA: Diagnosis not present

## 2017-11-09 DIAGNOSIS — O0992 Supervision of high risk pregnancy, unspecified, second trimester: Secondary | ICD-10-CM | POA: Insufficient documentation

## 2017-11-09 DIAGNOSIS — Z8751 Personal history of pre-term labor: Secondary | ICD-10-CM

## 2017-11-09 DIAGNOSIS — Z98891 History of uterine scar from previous surgery: Secondary | ICD-10-CM

## 2017-11-09 DIAGNOSIS — Z8759 Personal history of other complications of pregnancy, childbirth and the puerperium: Secondary | ICD-10-CM

## 2017-11-09 LAB — POCT URINALYSIS DIP (DEVICE)
BILIRUBIN URINE: NEGATIVE
Glucose, UA: NEGATIVE mg/dL
Hgb urine dipstick: NEGATIVE
Ketones, ur: NEGATIVE mg/dL
Leukocytes, UA: NEGATIVE
NITRITE: NEGATIVE
Protein, ur: NEGATIVE mg/dL
Specific Gravity, Urine: 1.025 (ref 1.005–1.030)
Urobilinogen, UA: 0.2 mg/dL (ref 0.0–1.0)
pH: 6 (ref 5.0–8.0)

## 2017-11-09 MED ORDER — FLUCONAZOLE 150 MG PO TABS: 150.0000 mg | ORAL_TABLET | Freq: Once | ORAL | 0 refills | Status: AC

## 2017-11-09 MED ORDER — PRENATAL MULTIVITAMIN CH: 1.0000 | ORAL_TABLET | Freq: Every day | ORAL | 6 refills | Status: DC

## 2017-11-09 NOTE — Progress Notes (Signed)
Pt states has been itching around vagina, thinks she has a yeast infection.

## 2017-11-09 NOTE — Progress Notes (Signed)
Subjective:  Sherri Shepherd is a [id number] [redacted]w[redacted]d being seen today for her first obstetrical visit.  Her obstetrical history is significant for preterm deliveries, history of incompetent cervix necessitating a prophylactic cerclage.. Patient does intend to breast feed. Pregnancy history fully reviewed.  Patient reports no complaints.  BP 115/74   Pulse 85   Wt 123 lb 9.6 oz (56.1 kg)   LMP 08/05/2017 (Exact Date)   BMI 20.57 kg/m   HISTORY: OB History  Gravida Para Term Preterm AB Living  4 3 0 3 0 2  SAB TAB Ectopic Multiple Live Births  0 0 0 0 2    # Outcome Date GA Lbr Len/2nd Weight Sex Delivery Anes PTL Lv  4 Current           3 Preterm 11/20/16 [redacted]w[redacted]d  5 lb 11.4 oz (2.59 kg) M CS-LTranv Spinal  LIV  2 Preterm 03/14/15 [redacted]w[redacted]d  10 oz (0.283 kg) M Vag-Spont None  FD  1 Preterm 10/22/11 [redacted]w[redacted]d 11:10 / 03:30 5 lb 12.4 oz (2.62 kg) M Vag-Spont EPI  LIV     Birth Comments: none    Past Medical History:  Diagnosis Date  . Chlamydia Feb 2017  . H/O varicella   . Headache in pregnancy, antepartum, second trimester 06/25/2016  . Premature dilatation of cervix during pregnancy 10/01/2011   5/90%/-1  . Preterm delivery 12/04/2011   SVD at [redacted]w[redacted]d on 10/22/11    Past Surgical History:  Procedure Laterality Date  . CERVICAL CERCLAGE N/A 08/09/2016   Procedure: CERCLAGE CERVICAL;  Surgeon: Catalina Antigua, MD;  Location: [facility] ORS;  Service: Gynecology;  Laterality: N/A;  . CESAREAN SECTION N/A 11/20/2016   Procedure: CESAREAN SECTION;  Surgeon: Willodean Rosenthal, MD;  Location: [facility] SU[facility];  Service: Obstetrics;  Laterality: N/A;    Family History  Problem Relation Age of Onset  . Hypertension Mother   . Diabetes Brother   . Cancer Maternal Grandmother        LUNG  . Diabetes Maternal Grandfather   . Arthritis Paternal Grandmother   . Cancer Paternal Grandmother        BREAST  . Diabetes Maternal Uncle      Exam    Uterus:     Pelvic Exam:    Perineum: No  Hemorrhoids, Normal Perineum   Vulva: normal, Bartholin's, Urethra, Skene's normal   Vagina:  curdlike discharge   Cervix: multiparous appearance and no cervical motion tenderness   Adnexa: normal adnexa and no mass, fullness, tenderness   Bony Pelvis: gynecoid  System: Breast:  Declined exam   Skin: normal coloration and turgor, no rashes    Neurologic: gait normal; reflexes normal and symmetric   Extremities: normal strength, tone, and muscle mass, no deformities, no erythema, induration, or nodules   HEENT PERRLA and extra ocular movement intact   Mouth/Teeth mucous membranes moist, pharynx normal without lesions   Neck supple and no masses   Cardiovascular: regular rate and rhythm, no murmurs or gallops   Respiratory:  appears well, vitals normal, no respiratory distress, acyanotic, normal RR, ear and throat exam is normal, neck free of mass or lymphadenopathy, chest clear, no wheezing, crepitations, rhonchi, normal symmetric air entry   Abdomen: soft, non-tender; bowel sounds normal; no masses,  no organomegaly   Urinary: urethral meatus normal      Assessment:    Pregnancy: [id number] Patient Active Problem List   Diagnosis Date Noted  . Supervision of high risk pregnancy, antepartum, second trimester 11/09/2017  .  History of cervical incompetence 11/09/2017  . History of cervical cerclage 09/30/2017  . Pregnancy with inconclusive fetal viability 09/30/2017  . Status post primary low transverse cesarean section 11/21/2016  . History of preterm delivery 03/13/2015      Plan:   1. Supervision of high risk pregnancy, antepartum, second trimester FHT and FH normal. US scheduled. Panorama. - CHL AMB BABYSCRIPTS OPT IN - Culture, OB Urine - Cystic fibrosis gene test - Hemoglobinopathy Evaluation - Obstetric Panel, Including HIV - SMN1 COPY NUMBER ANALYSIS (SMA Carrier Screen) - Genetic Screening - US MFM OB DETAIL +14 WK; Future  2. Vaginal itching Diflucan prescribed -  Cervicovaginal ancillary only  3. Status post primary low transverse cesarean section Discussed possibility of VBAC, pt desires elective repeat due to fear of uterine rupture, even though rate of this happening is 1:1000.  4. History of preterm delivery Makena  5. History of cervical incompetence Discussed possibility of prophylactic cerclage - will schedule     Problem list reviewed and updated. 75% of 45 min visit spent on counseling and coordination of care.     Levie HeritageJacob J Stinson 11/09/2017

## 2017-11-09 NOTE — Progress Notes (Signed)
Medicaid Home Form Completed-11/09/17. Makena ordered thru [facility] on 11/09/17.

## 2017-11-10 LAB — CERVICOVAGINAL ANCILLARY ONLY
BACTERIAL VAGINITIS: NEGATIVE
CANDIDA VAGINITIS: POSITIVE — AB
Chlamydia: NEGATIVE
NEISSERIA GONORRHEA: NEGATIVE
Trichomonas: NEGATIVE

## 2017-11-11 LAB — URINE CULTURE, OB REFLEX

## 2017-11-11 LAB — CULTURE, OB URINE

## 2017-11-16 ENCOUNTER — Encounter: Payer: Self-pay | Admitting: *Deleted

## 2017-11-16 ENCOUNTER — Encounter (HOSPITAL_COMMUNITY): Payer: Self-pay | Admitting: *Deleted

## 2017-11-16 ENCOUNTER — Other Ambulatory Visit: Payer: Self-pay | Admitting: Obstetrics and Gynecology

## 2017-11-16 ENCOUNTER — Encounter (HOSPITAL_COMMUNITY)
Admission: RE | Disposition: A | Discharge status: Home / Self Care | Payer: Self-pay | Source: Ambulatory Visit | Attending: Obstetrics and Gynecology

## 2017-11-16 ENCOUNTER — Observation Stay (HOSPITAL_COMMUNITY)
Admission: RE | Admit: 2017-11-16 | Discharge: 2017-11-16 | Disposition: A | Discharge status: Home / Self Care | Payer: Medicaid Other | Source: Ambulatory Visit | Attending: Obstetrics and Gynecology | Admitting: Obstetrics and Gynecology

## 2017-11-16 DIAGNOSIS — Z3A15 15 weeks gestation of pregnancy: Secondary | ICD-10-CM | POA: Insufficient documentation

## 2017-11-16 DIAGNOSIS — O09899 Supervision of other high risk pregnancies, unspecified trimester: Secondary | ICD-10-CM

## 2017-11-16 DIAGNOSIS — O3432 Maternal care for cervical incompetence, second trimester: Secondary | ICD-10-CM | POA: Diagnosis not present

## 2017-11-16 DIAGNOSIS — Z8759 Personal history of other complications of pregnancy, childbirth and the puerperium: Secondary | ICD-10-CM

## 2017-11-16 DIAGNOSIS — O09219 Supervision of pregnancy with history of pre-term labor, unspecified trimester: Secondary | ICD-10-CM

## 2017-11-16 DIAGNOSIS — Z8742 Personal history of other diseases of the female genital tract: Secondary | ICD-10-CM

## 2017-11-16 DIAGNOSIS — Z8751 Personal history of pre-term labor: Secondary | ICD-10-CM

## 2017-11-16 LAB — CBC
HCT: 35 % — ABNORMAL LOW (ref 36.0–46.0)
Hemoglobin: 12.1 g/dL (ref 12.0–15.0)
MCH: 32.2 pg (ref 26.0–34.0)
MCHC: 34.6 g/dL (ref 30.0–36.0)
MCV: 93.1 fL (ref 78.0–100.0)
PLATELETS: 225 10*3/uL (ref 150–400)
RBC: 3.76 MIL/uL — ABNORMAL LOW (ref 3.87–5.11)
RDW: 15.3 % (ref 11.5–15.5)
WBC: 10.8 10*3/uL — AB (ref 4.0–10.5)

## 2017-11-16 LAB — TYPE AND SCREEN
ABO/RH(D): O POS
ANTIBODY SCREEN: NEGATIVE

## 2017-11-16 SURGERY — CERCLAGE, CERVIX, VAGINAL APPROACH
Anesthesia: Choice

## 2017-11-16 MED ORDER — LACTATED RINGERS IV SOLN
INTRAVENOUS | Status: DC
  Administered 2017-11-16: 12:00:00 via INTRAVENOUS

## 2017-11-16 MED ORDER — SOD CITRATE-CITRIC ACID 500-334 MG/5ML PO SOLN: 30.0000 mL | ORAL | Status: DC

## 2017-11-16 MED ORDER — IBUPROFEN 600 MG PO TABS: 600.0000 mg | ORAL_TABLET | Freq: Four times a day (QID) | ORAL | Status: DC

## 2017-11-16 MED ORDER — LACTATED RINGERS IV SOLN: INTRAVENOUS | Status: DC

## 2017-11-16 NOTE — H&P (Signed)
Obstetrics & Gynecology H&P   Date of Admission: 11/16/2017   Primary OBGYN: Center for [facility] Primary Care Provider: Patient, No Pcp Per  Reason for Admission: scheduled prophylactic cerclage  History of Present Illness: Sherri Shepherd is a 27 y.o. [phone] (Patient's last menstrual period was 08/05/2017 (exact date).), with the above CC. PMHx is significant for h/o STI, h/o c/s and h/o 2018 rescue cerclage at 21wks (pt had 36wk SVD) and h/o 20wk loss prior to that.  No OB problems, questions this morning    ROS: A 12-point review of systems was performed and negative, except as stated in the above HPI.  OBGYN History: As per HPI. OB History  Gravida Para Term Preterm AB Living  4 3 0 3 0 2  SAB TAB Ectopic Multiple Live Births  0 0 0 0 2    # Outcome Date GA Lbr Len/2nd Weight Sex Delivery Anes PTL Lv  4 Current           3 Preterm 11/20/16 [redacted]w[redacted]d  2590 g M CS-LTranv Spinal  LIV  2 Preterm 03/14/15 [redacted]w[redacted]d  283 g M Vag-Spont None  FD  1 Preterm 10/22/11 [redacted]w[redacted]d 11:10 / 03:30 2620 g M Vag-Spont EPI  LIV     Birth Comments: none    Past Medical History: Past Medical History:  Diagnosis Date  . Chlamydia Feb 2017  . H/O varicella   . Headache in pregnancy, antepartum, second trimester 06/25/2016  . Premature dilatation of cervix during pregnancy 10/01/2011   5/90%/-1  . Preterm delivery 12/04/2011   SVD at [redacted]w[redacted]d on 10/22/11    Past Surgical History: Past Surgical History:  Procedure Laterality Date  . CERVICAL CERCLAGE N/A 08/09/2016   Procedure: CERCLAGE CERVICAL;  Surgeon: Catalina Antigua, MD;  Location: [facility];  Service: Gynecology;  Laterality: N/A;  . CESAREAN SECTION N/A 11/20/2016   Procedure: CESAREAN SECTION;  Surgeon: Willodean Rosenthal, MD;  Location: [facility] BIRTHING SUITES;  Service: Obstetrics;  Laterality: N/A;    Family History:  Family History  Problem Relation Age of Onset  . Hypertension Mother   . Diabetes Brother   .  Cancer Maternal Grandmother        LUNG  . Diabetes Maternal Grandfather   . Arthritis Paternal Grandmother   . Cancer Paternal Grandmother        BREAST  . Diabetes Maternal Uncle     Social History:  Social History   Socioeconomic History  . Marital status: Single    Spouse name: Not on file  . Number of children: Not on file  . Years of education: 87  . Highest education level: Not on file  Occupational History  . Occupation: HOMEMAKER  Social Needs  . Financial resource strain: Not on file  . Food insecurity:    Worry: Not on file    Inability: Not on file  . Transportation needs:    Medical: Not on file    Non-medical: Not on file  Tobacco Use  . Smoking status: Never Smoker  . Smokeless tobacco: Never Used  Substance and Sexual Activity  . Alcohol use: No  . Drug use: No  . Sexual activity: Yes    Partners: Male    Birth control/protection: None  Lifestyle  . Physical activity:    Days per week: Not on file    Minutes per session: Not on file  . Stress: Not on file  Relationships  . Social connections:    Talks on phone:  Not on file    Gets together: Not on file    Attends religious service: Not on file    Active member of club or organization: Not on file    Attends meetings of clubs or organizations: Not on file    Relationship status: Not on file  . Intimate partner violence:    Fear of current or ex partner: Not on file    Emotionally abused: Not on file    Physically abused: Not on file    Forced sexual activity: Not on file  Other Topics Concern  . Not on file  Social History Narrative  . Not on file    Allergy: No Known Allergies  Current Outpatient Medications: Medications Prior to Admission  Medication Sig Dispense Refill Last Dose  . acetaminophen (TYLENOL) 500 MG tablet Take 500 mg by mouth every 6 (six) hours as needed for headache.    11/11/2017  . Prenatal Vit-Fe Fumarate-FA (PRENATAL MULTIVITAMIN) TABS tablet Take 1 tablet by  mouth daily at 12 noon. 90 tablet 6 11/15/2017  . senna-docusate (SENOKOT-S) 8.6-50 MG tablet Take 2 tablets by mouth at bedtime as needed for mild constipation. (Patient not taking: Reported on 09/07/2017) 30 tablet 0 Not Taking     Hospital Medications: Current Facility-Administered Medications  Medication Dose Route Frequency Provider Last Rate Last Dose  . ibuprofen (ADVIL,MOTRIN) tablet 600 mg  600 mg Oral Q6H [place] BingPickens, Antonetta Clanton, MD      . lactated ringers infusion   Intravenous Continuous Lewie LoronGermeroth, John, MD      . lactated ringers infusion   Intravenous Continuous [place] BingPickens, Esaul Dorwart, MD 125 mL/hr at 11/16/17 1212    . sodium citrate-citric acid (ORACIT) solution 30 mL  30 mL Oral On Call to OR [place] BingPickens, Reshawn Ostlund, MD         Physical Exam:   Current Vital Signs 24h Vital Sign Ranges  T 98.2 F (36.8 C) Temp  Avg: 98.2 F (36.8 C)  Min: 98.2 F (36.8 C)  Max: 98.2 F (36.8 C)  BP 109/63 BP  Min: 109/63  Max: 109/63  HR 70 Pulse  Avg: 70  Min: 70  Max: 70  RR 18 Resp  Avg: 18  Min: 18  Max: 18  SaO2   Room Air No data recorded       24 Hour I/O Current Shift I/O  Time Ins Outs No intake/output data recorded. No intake/output data recorded.   Patient Vitals for the past 24 hrs:  BP Temp Temp src Pulse Resp Height Weight  11/16/17 1210 109/63 98.2 F (36.8 C) Oral 70 18 5\' 5"  (1.651 m) 56.9 kg   FHR: pending  Body mass index is 20.88 kg/m. General appearance: Well nourished, well developed female in no acute distress.  Neck:  Supple, normal appearance, and no thyromegaly  Cardiovascular: S1, S2 normal, no murmur, rub or gallop, regular rate and rhythm Respiratory:  Clear to auscultation bilateral. Normal respiratory effort Abdomen: soft, gravid, nttp Skin:  Warm and dry.  Extremities: no clubbing, cyanosis, or edema.   Laboratory: 9/16: cffdna pending. NOB labs neg  Imaging:  8/8: SLIUP FHR 177, CRL 9/0 weeks  Assessment: pt doing well  Plan: D/w her re: procedure  and she is amenable with proceeding. D/w her recommend 48h of motrin after procedure and will give 1st dose in PACU. Also d/w her pelvic rest for the duration of the pregnancy. Pt has visit to start 17p next week  Can proceed when OR is ready  Durene Romans MD Attending Center for [facility])

## 2017-11-16 NOTE — Discharge Instructions (Signed)

## 2017-11-16 NOTE — Progress Notes (Signed)
Pt discharge to home/ambulatory/with instructions Sabino Niemann- Tersa Fotopoulos WhitlockRNC 11/16/17 1334

## 2017-11-16 NOTE — Progress Notes (Signed)
Patient states she doesn't have a ride for today. I told her she will need a ride home and someone that can keep an eye on her for the rest of the day. She is due to start her 17p inj on 10/2 and would like to have there cerclage then b/c she'll have a ride. Request sent to scheduling for ppx cerclage placement on 10/2 for late morning/early afternoon.  Cornelia Copaharlie Vasil Juhasz, Jr MD Attending [facility]) 11/16/2017 Time: [phone]

## 2017-11-17 ENCOUNTER — Encounter (HOSPITAL_COMMUNITY): Payer: Self-pay

## 2017-11-17 LAB — HEMOGLOBINOPATHY EVALUATION
FERRITIN: 19 ng/mL (ref 15–150)
HGB F QUANT: 0 % (ref 0.0–2.0)
HGB SOLUBILITY: NEGATIVE
Hgb A2 Quant: 2.6 % (ref 1.8–3.2)
Hgb A: 97.4 % (ref 96.4–98.8)
Hgb C: 0 %
Hgb S: 0 %
Hgb Variant: 0 %

## 2017-11-17 LAB — OBSTETRIC PANEL, INCLUDING HIV
Antibody Screen: NEGATIVE
BASOS ABS: 0.1 10*3/uL (ref 0.0–0.2)
Basos: 1 %
EOS (ABSOLUTE): 0.1 10*3/uL (ref 0.0–0.4)
EOS: 1 %
HEMOGLOBIN: 12.4 g/dL (ref 11.1–15.9)
HEP B S AG: NEGATIVE
HIV Screen 4th Generation wRfx: NONREACTIVE
Hematocrit: 37.4 % (ref 34.0–46.6)
Immature Grans (Abs): 0 10*3/uL (ref 0.0–0.1)
Immature Granulocytes: 0 %
Lymphocytes Absolute: 2.1 10*3/uL (ref 0.7–3.1)
Lymphs: 21 %
MCH: 30.8 pg (ref 26.6–33.0)
MCHC: 33.2 g/dL (ref 31.5–35.7)
MCV: 93 fL (ref 79–97)
MONOCYTES: 6 %
Monocytes Absolute: 0.6 10*3/uL (ref 0.1–0.9)
NEUTROS ABS: 7.1 10*3/uL — AB (ref 1.4–7.0)
Neutrophils: 71 %
Platelets: 236 10*3/uL (ref 150–450)
RBC: 4.02 x10E6/uL (ref 3.77–5.28)
RDW: 15.7 % — ABNORMAL HIGH (ref 12.3–15.4)
RH TYPE: POSITIVE
RPR: NONREACTIVE
RUBELLA: 1.89 {index} (ref >0.99)
WBC: 10 10*3/uL (ref 3.4–10.8)

## 2017-11-17 LAB — SMN1 COPY NUMBER ANALYSIS (SMA CARRIER SCREENING)

## 2017-11-17 LAB — RPR: RPR: NONREACTIVE

## 2017-11-17 LAB — CYSTIC FIBROSIS GENE TEST

## 2017-11-18 ENCOUNTER — Encounter: Payer: Self-pay | Admitting: *Deleted

## 2017-11-19 NOTE — Discharge Summary (Signed)
Gynecology Discharge Summary Date of Admission: 11/16/2017 Date of Discharge: 11/16/2017  The patient was admitted, as scheduled, for placement of a prophylactic cerclage, but she stated that she didn't have a ride or anyone to watch her tonight right before surgery. She was rescheduled for next week.  Allergies as of 11/16/2017   No Known Allergies     Medication List    STOP taking these medications   senna-docusate 8.6-50 MG tablet Commonly known as:  Senokot-S     TAKE these medications   acetaminophen 500 MG tablet Commonly known as:  TYLENOL Take 500 mg by mouth every 6 (six) hours as needed for headache.   prenatal multivitamin Tabs tablet Take 1 tablet by mouth daily at 12 noon.       Future Appointments  Date Time Provider Department Center  11/25/2017  9:35 AM [facility] NURSE [facility] WOC  12/09/2017  1:00 PM [facility] HEALTH CLINICIAN [facility] WOC  12/09/2017  2:15 PM [place] Bing, MD WOC-WOCA WOC  12/14/2017 11:00 AM [facility] [place] 3 [facility]US MFC-US    Cornelia Copa. MD Attending Center for [facility])

## 2017-11-21 NOTE — H&P (Signed)
  Sherri Shepherd is an 27 y.o. [phone] [redacted]w[redacted]d female.   Chief Complaint: H/o cervical incompetence. HPI: H/o rescue cerclage last pregnancy. For prophylactic this pregnancy  Past Medical History:  Diagnosis Date  . Chlamydia Feb 2017  . H/O varicella   . Headache in pregnancy, antepartum, second trimester 06/25/2016  . Premature dilatation of cervix during pregnancy 10/01/2011   5/90%/-1  . Preterm delivery 12/04/2011   SVD at [redacted]w[redacted]d on 10/22/11    Past Surgical History:  Procedure Laterality Date  . CERVICAL CERCLAGE N/A 08/09/2016   Procedure: CERCLAGE CERVICAL;  Surgeon: Catalina Antigua, MD;  Location: [facility];  Service: Gynecology;  Laterality: N/A;  . CESAREAN SECTION N/A 11/20/2016   Procedure: CESAREAN SECTION;  Surgeon: Willodean Rosenthal, MD;  Location: [facility];  Service: Obstetrics;  Laterality: N/A;    Family History  Problem Relation Age of Onset  . Hypertension Mother   . Diabetes Brother   . Cancer Maternal Grandmother        LUNG  . Diabetes Maternal Grandfather   . Arthritis Paternal Grandmother   . Cancer Paternal Grandmother        BREAST  . Diabetes Maternal Uncle    Social History:  reports that she has never smoked. She has never used smokeless tobacco. She reports that she does not drink alcohol or use drugs.   No Known Allergies  No medications prior to admission.     A comprehensive review of systems was negative.  Last menstrual period 08/05/2017, not currently breastfeeding. General appearance: alert, cooperative and appears stated age Head: Normocephalic, without obvious abnormality, atraumatic Neck: supple, symmetrical, trachea midline Lungs: normal effort Heart: regular rate and rhythm Abdomen: soft, non-tender; bowel sounds normal; no masses,  no organomegaly and gravid Extremities: Homans sign is negative, no sign of DVT Skin: Skin color, texture, turgor normal. No rashes or lesions Neurologic: Grossly normal   Lab  Results  Component Value Date   WBC 10.8 (H) 11/16/2017   HGB 12.1 11/16/2017   HCT 35.0 (L) 11/16/2017   MCV 93.1 11/16/2017   PLT 225 11/16/2017         ABO, Rh: --/--/O POS (09/23 1218)  Antibody: NEG (09/23 1218)  Rubella: 1.89 (09/16 1201)  RPR: Non Reactive (09/23 1218)  HBsAg: Negative (09/16 1201)  HIV: Non Reactive (09/16 1201)  GBS:       Assessment/Plan Principal Problem:   History of cervical incompetence Active Problems:   History of cervical cerclage  For cervical cerclage. Risks include but are not limited to bleeding, infection, injury to surrounding structures, including bowel, bladder and ureters, blood clots, and death.  Likelihood of success is high.    [name] 11/21/2017, 11:03 PM

## 2017-11-25 ENCOUNTER — Other Ambulatory Visit: Payer: Self-pay

## 2017-11-25 ENCOUNTER — Encounter (HOSPITAL_COMMUNITY): Payer: Self-pay | Admitting: *Deleted

## 2017-11-25 ENCOUNTER — Ambulatory Visit (HOSPITAL_COMMUNITY): Payer: Medicaid Other | Admitting: Certified Registered Nurse Anesthetist

## 2017-11-25 ENCOUNTER — Ambulatory Visit (HOSPITAL_COMMUNITY)
Admission: RE | Admit: 2017-11-25 | Discharge: 2017-11-25 | Disposition: A | Discharge status: Home / Self Care | Payer: Medicaid Other | Source: Ambulatory Visit | Attending: Family Medicine | Admitting: Family Medicine

## 2017-11-25 ENCOUNTER — Ambulatory Visit (INDEPENDENT_AMBULATORY_CARE_PROVIDER_SITE_OTHER): Payer: Medicaid Other | Admitting: *Deleted

## 2017-11-25 ENCOUNTER — Encounter (HOSPITAL_COMMUNITY)
Admission: RE | Disposition: A | Discharge status: Home / Self Care | Payer: Self-pay | Source: Ambulatory Visit | Attending: Family Medicine

## 2017-11-25 VITALS — BP 115/76 | HR 85

## 2017-11-25 DIAGNOSIS — Z8742 Personal history of other diseases of the female genital tract: Secondary | ICD-10-CM

## 2017-11-25 DIAGNOSIS — O3430 Maternal care for cervical incompetence, unspecified trimester: Secondary | ICD-10-CM

## 2017-11-25 DIAGNOSIS — Z8249 Family history of ischemic heart disease and other diseases of the circulatory system: Secondary | ICD-10-CM | POA: Insufficient documentation

## 2017-11-25 DIAGNOSIS — O3432 Maternal care for cervical incompetence, second trimester: Secondary | ICD-10-CM | POA: Insufficient documentation

## 2017-11-25 DIAGNOSIS — Z8759 Personal history of other complications of pregnancy, childbirth and the puerperium: Secondary | ICD-10-CM

## 2017-11-25 DIAGNOSIS — O09212 Supervision of pregnancy with history of pre-term labor, second trimester: Secondary | ICD-10-CM | POA: Diagnosis not present

## 2017-11-25 DIAGNOSIS — Z803 Family history of malignant neoplasm of breast: Secondary | ICD-10-CM | POA: Diagnosis not present

## 2017-11-25 DIAGNOSIS — Z3A16 16 weeks gestation of pregnancy: Secondary | ICD-10-CM | POA: Insufficient documentation

## 2017-11-25 DIAGNOSIS — O09899 Supervision of other high risk pregnancies, unspecified trimester: Secondary | ICD-10-CM

## 2017-11-25 DIAGNOSIS — O09219 Supervision of pregnancy with history of pre-term labor, unspecified trimester: Principal | ICD-10-CM

## 2017-11-25 HISTORY — PX: CERVICAL CERCLAGE: SHX1329

## 2017-11-25 LAB — TYPE AND SCREEN
ABO/RH(D): O POS
Antibody Screen: NEGATIVE

## 2017-11-25 LAB — CBC
HCT: 34.5 % — ABNORMAL LOW (ref 36.0–46.0)
Hemoglobin: 11.8 g/dL — ABNORMAL LOW (ref 12.0–15.0)
MCH: 32.1 pg (ref 26.0–34.0)
MCHC: 34.2 g/dL (ref 30.0–36.0)
MCV: 93.8 fL (ref 78.0–100.0)
PLATELETS: 198 10*3/uL (ref 150–400)
RBC: 3.68 MIL/uL — AB (ref 3.87–5.11)
RDW: 15.1 % (ref 11.5–15.5)
WBC: 11.4 10*3/uL — AB (ref 4.0–10.5)

## 2017-11-25 SURGERY — CERCLAGE, CERVIX, VAGINAL APPROACH
Anesthesia: Spinal

## 2017-11-25 MED ORDER — INDOMETHACIN 50 MG PO CAPS: 50.0000 mg | ORAL_CAPSULE | Freq: Four times a day (QID) | ORAL | 0 refills | Status: DC

## 2017-11-25 MED ORDER — BUPIVACAINE HCL (PF) 0.25 % IJ SOLN
INTRAMUSCULAR | Status: DC | PRN
  Administered 2017-11-25: 10 mL

## 2017-11-25 MED ORDER — MEPERIDINE HCL 25 MG/ML IJ SOLN: 6.2500 mg | INTRAMUSCULAR | Status: DC | PRN

## 2017-11-25 MED ORDER — ONDANSETRON HCL 4 MG/2ML IJ SOLN
INTRAMUSCULAR | Status: DC | PRN
  Administered 2017-11-25: 4 mg via INTRAVENOUS

## 2017-11-25 MED ORDER — ONDANSETRON HCL 4 MG/2ML IJ SOLN
INTRAMUSCULAR | Status: AC
  Filled 2017-11-25: qty 2

## 2017-11-25 MED ORDER — PHENYLEPHRINE 40 MCG/ML (10ML) SYRINGE FOR IV PUSH (FOR BLOOD PRESSURE SUPPORT)
PREFILLED_SYRINGE | INTRAVENOUS | Status: DC | PRN
  Administered 2017-11-25: 40 ug via INTRAVENOUS

## 2017-11-25 MED ORDER — HYDROCODONE-ACETAMINOPHEN 7.5-325 MG PO TABS: 1.0000 | ORAL_TABLET | Freq: Once | ORAL | Status: DC | PRN

## 2017-11-25 MED ORDER — PHENYLEPHRINE 40 MCG/ML (10ML) SYRINGE FOR IV PUSH (FOR BLOOD PRESSURE SUPPORT)
PREFILLED_SYRINGE | INTRAVENOUS | Status: AC
  Filled 2017-11-25: qty 10

## 2017-11-25 MED ORDER — BUPIVACAINE IN DEXTROSE 0.75-8.25 % IT SOLN
INTRATHECAL | Status: DC | PRN
  Administered 2017-11-25: 1.2 mL via INTRATHECAL

## 2017-11-25 MED ORDER — ONDANSETRON HCL 4 MG/2ML IJ SOLN: 4.0000 mg | Freq: Once | INTRAMUSCULAR | Status: DC | PRN

## 2017-11-25 MED ORDER — FENTANYL CITRATE (PF) 100 MCG/2ML IJ SOLN
25.0000 ug | INTRAMUSCULAR | Status: DC | PRN
  Administered 2017-11-25: 50 ug via INTRAVENOUS

## 2017-11-25 MED ORDER — HYDROXYPROGESTERONE CAPROATE 250 MG/ML IM OIL
250.0000 mg | TOPICAL_OIL | INTRAMUSCULAR | Status: DC
  Administered 2017-11-25 – 2018-04-07 (×8): 250 mg via INTRAMUSCULAR

## 2017-11-25 MED ORDER — FENTANYL CITRATE (PF) 100 MCG/2ML IJ SOLN
INTRAMUSCULAR | Status: AC
  Filled 2017-11-25: qty 2

## 2017-11-25 MED ORDER — INDOMETHACIN 50 MG PO CAPS
50.0000 mg | ORAL_CAPSULE | Freq: Once | ORAL | Status: AC
  Administered 2017-11-25: 50 mg via ORAL
  Filled 2017-11-25: qty 1

## 2017-11-25 MED ORDER — LACTATED RINGERS IV SOLN
INTRAVENOUS | Status: DC
  Administered 2017-11-25: 10:00:00 via INTRAVENOUS

## 2017-11-25 MED ORDER — BUPIVACAINE HCL (PF) 0.25 % IJ SOLN
INTRAMUSCULAR | Status: AC
  Filled 2017-11-25: qty 30

## 2017-11-25 SURGICAL SUPPLY — 16 items
CANISTER SUCT 3000ML PPV (MISCELLANEOUS) ×3 IMPLANT
GLOVE BIOGEL PI IND STRL 7.0 (GLOVE) ×2 IMPLANT
GLOVE BIOGEL PI INDICATOR 7.0 (GLOVE) ×4
GLOVE ECLIPSE 7.0 STRL STRAW (GLOVE) ×3 IMPLANT
GOWN STRL REUS W/TWL LRG LVL3 (GOWN DISPOSABLE) ×9 IMPLANT
NEEDLE MAYO CATGUT SZ4 (NEEDLE) ×3 IMPLANT
NEEDLE SPNL 22GX3.5 QUINCKE BK (NEEDLE) ×3 IMPLANT
PACK VAGINAL MINOR WOMEN LF (CUSTOM PROCEDURE TRAY) ×3 IMPLANT
PAD OB MATERNITY 4.3X12.25 (PERSONAL CARE ITEMS) ×3 IMPLANT
PAD PREP 24X48 CUFFED NSTRL (MISCELLANEOUS) ×3 IMPLANT
SUT MERSILENE 5MM BP 1 12 (SUTURE) ×3 IMPLANT
TOWEL OR 17X24 6PK STRL BLUE (TOWEL DISPOSABLE) ×6 IMPLANT
TRAY FOLEY W/BAG SLVR 14FR (SET/KITS/TRAYS/PACK) ×3 IMPLANT
TUBING NON-CON 1/4 X 20 CONN (TUBING) IMPLANT
TUBING NON-CON 1/4 X 20' CONN (TUBING)
YANKAUER SUCT BULB TIP NO VENT (SUCTIONS) IMPLANT

## 2017-11-25 NOTE — Op Note (Signed)
Preoperative diagnosis: incompetent cervix   Postoperative diagnosis: Same  Procedure: Cervical cerclage  Surgeon: Tinnie Gens, M.D.  Anesthesia: Spinal Mal Amabile, MD   Findings: 0.5 cm cervix, patulent  Estimated blood loss: 100  Specimens: None  Reason for procedure: SHEALYN SEAN [id number] [redacted]w[redacted]d, with h/o rescue cerclage in previous pregnancy  Procedure: Patient was taken to the operating room where spinal analgesia was administered. She was prepped and draped in the usual sterile fashion.  A Foley catheter is used to drain her bladder. A timeout was performed. The patient had SCDs in place prior to procedure. The patient was in dorsal lithotomy.  A weighted speculum was placed inside the vagina.  A Deaver was used anteriorly. The cervix was grasped with an open ring  forcep. A 5 mm Mersilene band on a cutting needle was used and to put in a pursestring suture. This was started at 12:00 and exiting at 9:00, starting at 9:00 and exiting at 6:00, starting at 6:00 and exiting at 3:00, starting at 3:00 and exiting at 12:00. A suture was then tied down. All instrument, needle and lap counts were correct x 2. The patient was taken to recovery in stable condition.  Shelbie Proctor PrattMD 11/25/2017 11:54 AM

## 2017-11-25 NOTE — Anesthesia Procedure Notes (Signed)
Spinal

## 2017-11-25 NOTE — Progress Notes (Signed)
I have reviewed this chart and agree with the RN/CMA assessment and management.    K. Meryl Howell Groesbeck, M.D. [facility]  

## 2017-11-25 NOTE — Anesthesia Postprocedure Evaluation (Signed)
Anesthesia Post Note  Patient: LORRI FUKUHARA  Procedure(s) Performed: CERCLAGE CERVICAL (N/A )     Patient location during evaluation: PACU Anesthesia Type: Spinal Level of consciousness: oriented and awake and alert Pain management: pain level controlled Vital Signs Assessment: post-procedure vital signs reviewed and stable Respiratory status: spontaneous breathing, respiratory function stable and nonlabored ventilation Cardiovascular status: blood pressure returned to baseline and stable Postop Assessment: no headache, no backache, no apparent nausea or vomiting, patient able to bend at knees and spinal receding Anesthetic complications: no    Last Vitals:  Vitals:   11/25/17 1315 11/25/17 1431  BP: 102/69 120/80  Pulse: 60 96  Resp: 20 18  Temp:    SpO2: 100%     Last Pain:  Vitals:   11/25/17 1500  TempSrc:   PainSc: 3    Pain Goal: Patients Stated Pain Goal: 4 (11/25/17 1500)               Ardena Gangl A.

## 2017-11-25 NOTE — Transfer of Care (Signed)
Immediate Anesthesia Transfer of Care Note  Patient: Lenell Antu  Procedure(s) Performed: CERCLAGE CERVICAL (N/A )  Patient Location: PACU  Anesthesia Type:Spinal  Level of Consciousness: awake, alert  and oriented  Airway & Oxygen Therapy: Patient Spontanous Breathing  Post-op Assessment: Report given to RN and Post -op Vital signs reviewed and stable  Post vital signs: Reviewed and stable  Last Vitals:  Vitals Value Taken Time  BP 103/60 11/25/2017 12:15 PM  Temp 36.6 C 11/25/2017 12:06 PM  Pulse 69 11/25/2017 12:15 PM  Resp 17 11/25/2017 12:15 PM  SpO2 100 % 11/25/2017 12:15 PM  Vitals shown include unvalidated device data.  Last Pain:  Vitals:   11/25/17 1206  TempSrc: Oral  PainSc: 0-No pain         Complications: No apparent anesthesia complications

## 2017-11-25 NOTE — Progress Notes (Signed)
Pt discharged to home with significant other and toddler son.  Condition stable.  Pt ambulated to car with Worthy Keeler, NT.  No equipment for home ordered at discharge.

## 2017-11-25 NOTE — Anesthesia Preprocedure Evaluation (Signed)
Anesthesia Evaluation  Patient identified by MRN, date of birth, ID band Patient awake    Reviewed: Allergy & Precautions, NPO status , Patient's Chart, lab work & pertinent test results  Airway Mallampati: I  TM Distance: >3 FB Neck ROM: Full    Dental no notable dental hx. (+) Teeth Intact   Pulmonary neg pulmonary ROS,    Pulmonary exam normal breath sounds clear to auscultation       Cardiovascular negative cardio ROS Normal cardiovascular exam Rhythm:Regular Rate:Normal     Neuro/Psych  Headaches, negative psych ROS   GI/Hepatic negative GI ROS, Neg liver ROS,   Endo/Other  negative endocrine ROS  Renal/GU negative Renal ROS  negative genitourinary   Musculoskeletal negative musculoskeletal ROS (+)   Abdominal   Peds  Hematology negative hematology ROS (+)   Anesthesia Other Findings   Reproductive/Obstetrics (+) Pregnancy 16 6/7 weeks Incompetent Cervix                              Anesthesia Physical Anesthesia Plan  ASA: II  Anesthesia Plan: Spinal   Post-op Pain Management:    Induction:   PONV Risk Score and Plan: 4 or greater and Ondansetron and Treatment may vary due to age or medical condition  Airway Management Planned: Natural Airway, Simple Face Mask and Nasal Cannula  Additional Equipment:   Intra-op Plan:   Post-operative Plan:   Informed Consent: I have reviewed the patients History and Physical, chart, labs and discussed the procedure including the risks, benefits and alternatives for the proposed anesthesia with the patient or authorized representative who has indicated his/her understanding and acceptance.   Dental advisory given  Plan Discussed with: CRNA and Surgeon  Anesthesia Plan Comments:         Anesthesia Quick Evaluation

## 2017-11-25 NOTE — Progress Notes (Signed)
Pt tolerating regular diet.  Foley catheter removed at 15:47.  Pt has voided .  Vaginal spotting of dark red blood with blood tinged mucus.  Is not saturating pads, but blood noted on toilet paper when wipes.   Phoned Dr. Alvester Morin with above information.  She said we may proceed with discharge as previously ordered.

## 2017-11-25 NOTE — Discharge Instructions (Signed)
Cervical Cerclage, Care After  This sheet gives you information about how to care for yourself after your procedure. Your health care provider may also give you more specific instructions. If you have problems or questions, contact your health care provider.  What can I expect after the procedure?  After your procedure, it is common to have:   Cramping in your abdomen.   Mucus discharge for several days.   Painful urination (dysuria).   Small drops of blood coming from your vagina (spotting).    Follow these instructions at home:   Follow instructions from your health care provider about bed rest, if this applies. You may need to be on bed rest for up to 3 days.   Take over-the-counter and prescription medicines only as told by your health care provider.   Do not drive or use heavy machinery while taking prescription pain medicine.   Keep track of your vaginal discharge and watch for any changes. If you notice changes, tell your health care provider.   Avoid physical activities and exercise until your health care provider approves. Ask your health care provider what activities are safe for you.   Until your health care provider approves:  ? Do not douche.  ? Do not have sexual intercourse.   Keep all pre-birth (prenatal) visits and all follow-up visits as told by your health care provider. This is important. You will probably have weekly visits to have your cervix checked, and you may need an ultrasound.    Contact a health care provider if:   You have abnormal or bad-smelling vaginal discharge, such as clots.   You develop a rash on your skin. This may look like redness and swelling.   You become light-headed or feel like you are going to faint.   You have abdominal pain that does not get better with medicine.   You have persistent nausea or vomiting.    Get help right away if:   You have vaginal bleeding that is heavier or more frequent than spotting.   You are leaking fluid or have a gush of  fluid from your vagina (your water breaks).   You have a fever or chills.   You faint.   You have uterine contractions. These may feel like:  ? A back ache.  ? Lower abdominal pain.  ? Mild cramps, similar to menstrual cramps.  ? Tightening or pressure in your abdomen.   You think that your baby is not moving as much as usual, or you cannot feel your baby move.   You have chest pain.   You have shortness of breath.  This information is not intended to replace advice given to you by your health care provider. Make sure you discuss any questions you have with your health care provider.    Document Released: 12/01/2012 Document Revised: 10/10/2015 Document Reviewed: 09/14/2015  Elsevier Interactive Patient Education  2018 Elsevier Inc[employer]

## 2017-11-25 NOTE — Interval H&P Note (Signed)
History and Physical Interval Note:  11/25/2017 11:03 AM  Sherri Shepherd  has presented today for surgery, with the diagnosis of Incompetent Cervix  The various methods of treatment have been discussed with the patient and family. After consideration of risks, benefits and other options for treatment, the patient has consented to  Procedure(s): CERCLAGE CERVICAL (N/A) as a surgical intervention .  The patient's history has been reviewed, patient examined, no change in status, stable for surgery.  I have reviewed the patient's chart and labs.  Questions were answered to the patient's satisfaction.     [name]

## 2017-11-25 NOTE — Anesthesia Procedure Notes (Addendum)
Spinal  Patient location during procedure: OR Start time: 11/25/2017 11:28 AM Staffing Anesthesiologist: Mal Amabile, MD Performed: anesthesiologist  Preanesthetic Checklist Completed: patient identified, site marked, surgical consent, pre-op evaluation, timeout performed, IV checked, risks and benefits discussed and monitors and equipment checked Spinal Block Patient position: sitting Prep: site prepped and draped and DuraPrep Patient monitoring: heart rate, cardiac monitor, continuous pulse ox and blood pressure Approach: midline Location: L3-4 Injection technique: single-shot Needle Needle type: Pencan  Needle gauge: 24 G Needle length: 9 cm Needle insertion depth: 5 cm Assessment Sensory level: T8

## 2017-11-25 NOTE — Progress Notes (Signed)
Here for first 17p injection. C/o pain under  Left rib =9, states hurts all the time; but worse when she bends or turns or lays down. Started 11/23/17. Denies SOB, states tylenol does not help. Discussed with Dr. Earlene Plater.Advised patient may be musculoskeletal- may keep taking tylenol as needed, may apply heating pad, try to rest and avoid actitvities that aggravate the pain. Go to MAU if she gets SOB, nausea and vomiting, pain worsens. She voices understanding. Also discussed she denies constipation.

## 2017-12-02 ENCOUNTER — Encounter (HOSPITAL_COMMUNITY): Payer: Self-pay

## 2017-12-02 ENCOUNTER — Ambulatory Visit: Payer: Self-pay

## 2017-12-09 ENCOUNTER — Encounter: Payer: Self-pay | Admitting: Obstetrics and Gynecology

## 2017-12-09 ENCOUNTER — Telehealth: Payer: Self-pay | Admitting: Obstetrics and Gynecology

## 2017-12-09 ENCOUNTER — Ambulatory Visit: Payer: Self-pay

## 2017-12-09 ENCOUNTER — Encounter (HOSPITAL_COMMUNITY): Payer: Self-pay

## 2017-12-09 ENCOUNTER — Other Ambulatory Visit: Payer: Self-pay | Admitting: Family Medicine

## 2017-12-09 ENCOUNTER — Ambulatory Visit (HOSPITAL_COMMUNITY)
Admission: RE | Admit: 2017-12-09 | Discharge: 2017-12-09 | Disposition: A | Discharge status: Home / Self Care | Payer: Medicaid Other | Source: Ambulatory Visit | Attending: Family Medicine | Admitting: Family Medicine

## 2017-12-09 DIAGNOSIS — Z3A18 18 weeks gestation of pregnancy: Secondary | ICD-10-CM | POA: Insufficient documentation

## 2017-12-09 DIAGNOSIS — O09213 Supervision of pregnancy with history of pre-term labor, third trimester: Secondary | ICD-10-CM

## 2017-12-09 DIAGNOSIS — Z363 Encounter for antenatal screening for malformations: Secondary | ICD-10-CM

## 2017-12-09 DIAGNOSIS — O3432 Maternal care for cervical incompetence, second trimester: Secondary | ICD-10-CM | POA: Diagnosis not present

## 2017-12-09 DIAGNOSIS — O0992 Supervision of high risk pregnancy, unspecified, second trimester: Secondary | ICD-10-CM | POA: Insufficient documentation

## 2017-12-09 DIAGNOSIS — O34219 Maternal care for unspecified type scar from previous cesarean delivery: Secondary | ICD-10-CM

## 2017-12-09 NOTE — Telephone Encounter (Signed)
Called pt about her missed appt to get her rescheduled. Phone rang a few than said the number wasn't accepting calls. No VM setup to leave message. Mailed no show letter.

## 2017-12-09 NOTE — BH Specialist Note (Deleted)
Integrated Behavioral Health Follow Up Visit  MRN: [id number] Name: MEILI KLECKLEY  Number of Integrated Behavioral Health Clinician visits: 2/6 Session Start time: ***  Session End time: *** Total time: {IBH Total Time:21014050}  Type of Service: Integrated Behavioral Health- Individual/Family Interpretor:No. Interpretor Name and Language: n/a  SUBJECTIVE: ROSELIND KLUS is a 27 y.o. female accompanied by {Patient accompanied by:[phone]} Patient was referred by *** for ***. Patient reports the following symptoms/concerns: *** Duration of problem: ***; Severity of problem: {Mild/Moderate/Severe:20260}  OBJECTIVE: Mood: {BHH MOOD:22306} and Affect: {BHH AFFECT:22307} Risk of harm to self or others: {CHL AMB BH Suicide Current Mental Status:21022748}  LIFE CONTEXT: Family and Social: *** School/Work: *** Self-Care: *** Life Changes: ***  GOALS ADDRESSED: Patient will: 1.  Reduce symptoms of: {IBH Symptoms:21014056}  2.  Increase knowledge and/or ability of: {IBH Patient Tools:21014057}  3.  Demonstrate ability to: {IBH Goals:21014053}  INTERVENTIONS: Interventions utilized:  {IBH Interventions:21014054} Standardized Assessments completed: {IBH Screening Tools:21014051}  ASSESSMENT: Patient currently experiencing ***.   Patient may benefit from ***.  PLAN: 1. Follow up with behavioral health clinician on : *** 2. Behavioral recommendations: *** 3. Referral(s): {IBH Referrals:21014055} 4. "From scale of 1-10, how likely are you to follow plan?": ***  Valetta Close Jolena Kittle, LCSW

## 2017-12-10 NOTE — Progress Notes (Signed)
Patient did not keep her OB appointment for 12/09/2017.  Cornelia Copa MD Attending Center for [employer] [facility])

## 2017-12-14 ENCOUNTER — Other Ambulatory Visit (HOSPITAL_COMMUNITY): Payer: Self-pay

## 2017-12-17 ENCOUNTER — Ambulatory Visit: Payer: Self-pay

## 2017-12-28 ENCOUNTER — Ambulatory Visit (INDEPENDENT_AMBULATORY_CARE_PROVIDER_SITE_OTHER): Payer: Medicaid Other | Admitting: Obstetrics & Gynecology

## 2017-12-28 VITALS — BP 109/74 | HR 81 | Wt 131.1 lb

## 2017-12-28 DIAGNOSIS — O0992 Supervision of high risk pregnancy, unspecified, second trimester: Secondary | ICD-10-CM

## 2017-12-28 DIAGNOSIS — O3430 Maternal care for cervical incompetence, unspecified trimester: Secondary | ICD-10-CM

## 2017-12-28 DIAGNOSIS — M549 Dorsalgia, unspecified: Secondary | ICD-10-CM

## 2017-12-28 DIAGNOSIS — Z23 Encounter for immunization: Secondary | ICD-10-CM

## 2017-12-28 DIAGNOSIS — O9989 Other specified diseases and conditions complicating pregnancy, childbirth and the puerperium: Secondary | ICD-10-CM

## 2017-12-28 DIAGNOSIS — O09212 Supervision of pregnancy with history of pre-term labor, second trimester: Secondary | ICD-10-CM | POA: Diagnosis present

## 2017-12-28 DIAGNOSIS — O09899 Supervision of other high risk pregnancies, unspecified trimester: Secondary | ICD-10-CM

## 2017-12-28 DIAGNOSIS — O3432 Maternal care for cervical incompetence, second trimester: Secondary | ICD-10-CM

## 2017-12-28 DIAGNOSIS — O09219 Supervision of pregnancy with history of pre-term labor, unspecified trimester: Principal | ICD-10-CM

## 2017-12-28 DIAGNOSIS — O99891 Other specified diseases and conditions complicating pregnancy: Secondary | ICD-10-CM

## 2017-12-28 MED ORDER — CYCLOBENZAPRINE HCL 10 MG PO TABS: 10.0000 mg | ORAL_TABLET | Freq: Three times a day (TID) | ORAL | 1 refills | Status: DC | PRN

## 2017-12-28 NOTE — Progress Notes (Signed)
PRENATAL VISIT NOTE  Subjective:  Sherri Shepherd is a 27 y.o. [phone] at [redacted]w[redacted]d being seen today for ongoing prenatal care.  She is currently monitored for the following issues for this high-risk pregnancy and has History of preterm delivery, currently pregnant; Status post primary low transverse cesarean section; Cervical cerclage suture present, antepartum; Supervision of high risk pregnancy, antepartum, second trimester; and History of cervical incompetence on their problem list.  Patient reports backache in lower middle portion of back that is positional in nature. No urinary symptoms, no PTL symptoms.   Contractions: Not present. Vag. Bleeding: None.  Movement: Present. Denies leaking of fluid.   The following portions of the patient's history were reviewed and updated as appropriate: allergies, current medications, past family history, past medical history, past social history, past surgical history and problem list. Problem list updated.  Objective:   Vitals:   12/28/17 0845  BP: 109/74  Pulse: 81  Weight: 131 lb 1.6 oz (59.5 kg)    Fetal Status: Fetal Heart Rate (bpm): 150 Fundal Height: 21 cm Movement: Present     General:  Alert, oriented and cooperative. Patient is in no acute distress.  Skin: Skin is warm and dry. No rash noted.   Cardiovascular: Normal heart rate noted  Respiratory: Normal respiratory effort, no problems with respiration noted  Abdomen: Soft, gravid, appropriate for gestational age.  Pain/Pressure: Present     Pelvic: Steril speculum cervical exam performed Dilation: Closed Effacement (%): Thick Station: Ballotable. Cerclage noted to be in place.  Extremities: Normal range of motion.  Edema: None  Mental Status: Normal mood and affect. Normal behavior. Normal judgment and thought content.  [place] Mfm Ob Transvaginal  Result Date: 12/09/2017 ----------------------------------------------------------------------  OBSTETRICS REPORT                        (Signed Final 12/09/2017 03:40 pm) ---------------------------------------------------------------------- Patient Info  ID #:       [id number]                          D.O.B.:  1990/07/10 (27 yrs)  Name:       Sherri Shepherd              Visit Date: 12/09/2017 02:37 pm ---------------------------------------------------------------------- Performed By  Performed By:     Ellin Saba        Ref. Address:     [facility]                    RD[facility]                                                             [facility]                                                             [address]                                                             [place], [place]                                                             [address]  Attending:        Patsi Sears      Location:         [facility]                    MD  Referred By:      [facility] for                    [facility]                    Healthcare ---------------------------------------------------------------------- Orders   #  Description                          Code         Ordered By   1  [place] MFM OB DETAIL +14 WK              [id number]     Candelaria Celeste   2  [place] MFM OB TRANSVAGINAL               19147.8      Candelaria Celeste  ----------------------------------------------------------------------   #  Order #                    Accession #                 Episode #   1  [id number]                  [id number]                  [id number]   2  [id number]                  [id number]                  [id number]  ---------------------------------------------------------------------- Indications   Poor obstetric history: Previous preterm       O09.219   delivery, antepartum 36 3/7, 20, 36 weeks   Cervical insufficiency, 2nd                    O34.32   Cervical cerclage suture present, second       O34.32   trimester   [redacted] weeks gestation of pregnancy                 Z3A.18   Previous cesarean delivery, antepartum - for   O34.219   repeat   Encounter for antenatal screening for          Z36.3  malformations  ---------------------------------------------------------------------- Fetal Evaluation  Num Of Fetuses:         1  Cardiac Activity:       Observed  Presentation:           Cephalic  Placenta:               Anterior  P. Cord Insertion:      Visualized, central  Amniotic Fluid  AFI FV:      Within normal limits                              Largest Pocket(cm)                              5 ---------------------------------------------------------------------- Biometry  BPD:      45.5  mm     G. Age:  19w 5d         85  %    CI:        72.92   %    70 - 86                                                          FL/HC:      17.7   %    16.1 - 18.3  HC:      169.4  mm     G. Age:  19w 4d         76  %    HC/AC:      1.20        1.09 - 1.39  AC:      141.3  mm     G. Age:  19w 4d         67  %    FL/BPD:     65.9   %  FL:         30  mm     G. Age:  19w 2d         58  %    FL/AC:      21.2   %    20 - 24  HUM:      28.7  mm     G. Age:  19w 2d         63  %  CER:      18.7  mm     G. Age:  18w 2d         34  %  NFT:         4  mm  CM:        3.9  mm  Est. FW:     292  gm    0 lb 10 oz      54  % ---------------------------------------------------------------------- OB History  Gravidity:    4         Prem:   3  Living:       2 ---------------------------------------------------------------------- Gestational Age  Clinical EDD:  18w 6d  EDD:   05/06/18  U/S Today:     19w 4d                                        EDD:   05/01/18  Best:          18w 6d     Det. By:  Clinical EDD             EDD:   05/06/18 ---------------------------------------------------------------------- Anatomy  Cranium:               Appears normal         LVOT:                   Appears normal  Cavum:                 Appears normal         Aortic Arch:             Appears normal  Ventricles:            Appears normal         Ductal Arch:            Appears normal  Choroid Plexus:        Appears normal         Diaphragm:              Appears normal  Cerebellum:            Appears normal         Stomach:                Appears normal, left                                                                        sided  Posterior Fossa:       Appears normal         Abdomen:                Appears normal  Nuchal Fold:           Appears normal         Abdominal Wall:         Appears nml (cord                                                                        insert, abd wall)  Face:                  Appears normal         Cord Vessels:           Appears normal (3                         (orbits and profile)  vessel cord)  Lips:                  Appears normal         Kidneys:                Appear normal  Palate:                Appears normal         Bladder:                Appears normal  Thoracic:              Appears normal         Spine:                  Appears normal  Heart:                 Appears normal         Upper Extremities:      Appears normal                         (4CH, axis, and                         situs)  RVOT:                  Appears normal         Lower Extremities:      Appears normal  Other:  Nasal bone visualized. Heels and 5th digit visualized. Female gender ---------------------------------------------------------------------- Cervix Uterus Adnexa  Cervix  Length:            4.3  cm.  Normal appearance by transvaginal scan Cerclage visualized.  Left Ovary  Within normal limits.  Right Ovary  Within normal limits. ---------------------------------------------------------------------- Comments  U/S images reviewed. Findings reviewed with patient.  Appropriate fetal growth is noted.  No fetal abnormalities are  seen.  Cervix appears closed, intact and measures 4.3 cm in  length.  Cerclage is seen.  Questions answered.  10  minutes spent face to face with patient.  Recommendations: 1) Follow-up as clinically indicated ---------------------------------------------------------------------- Recommendations   1) Follow-up as clinically indicated ----------------------------------------------------------------------               Patsi Sears, MD Electronically Signed Final Report   12/09/2017 03:40 pm ----------------------------------------------------------------------  [place] Mfm Ob Detail +14 Wk  Result Date: 12/09/2017 ----------------------------------------------------------------------  OBSTETRICS REPORT                       (Signed Final 12/09/2017 03:40 pm) ---------------------------------------------------------------------- Patient Info  ID #:       [id number]                          D.O.B.:  [birth year] (27 yrs)  Name:       Sherri Shepherd              Visit Date: 12/09/2017 02:37 pm ---------------------------------------------------------------------- Performed By  Performed By:     Ellin Saba        Ref. Address:     [facility]                    [facility]  [facility]                                                             [address]                                                             [place], [place]                                                             [address]  Attending:        Patsi Sears      Location:         [facility]                    MD  Referred By:      [facility] for                    [facility]                    Healthcare ---------------------------------------------------------------------- Orders   #  Description                          Code         Ordered By   1  [place] MFM OB DETAIL +14 WK              [id number]     Candelaria Celeste   2  [place] MFM OB TRANSVAGINAL               98119.1      Candelaria Celeste   ----------------------------------------------------------------------   #  Order #                    Accession #                 Episode #   1  [id number]                  [id number]                  [id number]   2  [id number]                  [id number]  [id number]  ---------------------------------------------------------------------- Indications   Poor obstetric history: Previous preterm       O09.219   delivery, antepartum 36 3/7, 20, 36 weeks   Cervical insufficiency, 2nd                    O34.32   Cervical cerclage suture present, second       O34.32   trimester   [redacted] weeks gestation of pregnancy                Z3A.18   Previous cesarean delivery, antepartum - for   O34.219   repeat   Encounter for antenatal screening for          Z36.3   malformations  ---------------------------------------------------------------------- Fetal Evaluation  Num Of Fetuses:         1  Cardiac Activity:       Observed  Presentation:           Cephalic  Placenta:               Anterior  P. Cord Insertion:      Visualized, central  Amniotic Fluid  AFI FV:      Within normal limits                              Largest Pocket(cm)                              5 ---------------------------------------------------------------------- Biometry  BPD:      45.5  mm     G. Age:  19w 5d         85  %    CI:        72.92   %    70 - 86                                                          FL/HC:      17.7   %    16.1 - 18.3  HC:      169.4  mm     G. Age:  19w 4d         76  %    HC/AC:      1.20        1.09 - 1.39  AC:      141.3  mm     G. Age:  19w 4d         67  %    FL/BPD:     65.9   %  FL:         30  mm     G. Age:  19w 2d         58  %    FL/AC:      21.2   %    20 - 24  HUM:      28.7  mm     G. Age:  19w 2d         63  %  CER:      18.7  mm     G. Age:  18w 2d         34  %  NFT:         4  mm  CM:        3.9  mm  Est. FW:     292  gm    0 lb 10 oz      54  %  ---------------------------------------------------------------------- OB History  Gravidity:    4         Prem:   3  Living:       2 ---------------------------------------------------------------------- Gestational Age  Clinical EDD:  18w 6d                                        EDD:   05/06/18  U/S Today:     19w 4d                                        EDD:   05/01/18  Best:          18w 6d     Det. By:  Clinical EDD             EDD:   05/06/18 ---------------------------------------------------------------------- Anatomy  Cranium:               Appears normal         LVOT:                   Appears normal  Cavum:                 Appears normal         Aortic Arch:            Appears normal  Ventricles:            Appears normal         Ductal Arch:            Appears normal  Choroid Plexus:        Appears normal         Diaphragm:              Appears normal  Cerebellum:            Appears normal         Stomach:                Appears normal, left                                                                        sided  Posterior Fossa:       Appears normal         Abdomen:                Appears normal  Nuchal Fold:           Appears normal         Abdominal Wall:         Appears nml (cord  insert, abd wall)  Face:                  Appears normal         Cord Vessels:           Appears normal (3                         (orbits and profile)                           vessel cord)  Lips:                  Appears normal         Kidneys:                Appear normal  Palate:                Appears normal         Bladder:                Appears normal  Thoracic:              Appears normal         Spine:                  Appears normal  Heart:                 Appears normal         Upper Extremities:      Appears normal                         (4CH, axis, and                         situs)  RVOT:                  Appears normal          Lower Extremities:      Appears normal  Other:  Nasal bone visualized. Heels and 5th digit visualized. Female gender ---------------------------------------------------------------------- Cervix Uterus Adnexa  Cervix  Length:            4.3  cm.  Normal appearance by transvaginal scan Cerclage visualized.  Left Ovary  Within normal limits.  Right Ovary  Within normal limits. ---------------------------------------------------------------------- Comments  U/S images reviewed. Findings reviewed with patient.  Appropriate fetal growth is noted.  No fetal abnormalities are  seen.  Cervix appears closed, intact and measures 4.3 cm in  length.  Cerclage is seen.  Questions answered.  10 minutes spent face to face with patient.  Recommendations: 1) Follow-up as clinically indicated ---------------------------------------------------------------------- Recommendations   1) Follow-up as clinically indicated ----------------------------------------------------------------------               Patsi Sears, MD Electronically Signed Final Report   12/09/2017 03:40 pm ----------------------------------------------------------------------   Assessment and Plan:  Pregnancy: [id number] at [redacted]w[redacted]d  1. History of preterm delivery, currently pregnant 2. Cervical cerclage suture present, antepartum Normal cerclage and cervical check today. Continue weekly 17P. PTL precautions strongly advised.  3. Back pain affecting pregnancy in second trimester Advised Tylenol, warm compresses. Flexeril prn.  - cyclobenzaprine (FLEXERIL) 10 MG tablet; Take 1 tablet (10 mg total) by mouth every 8 (eight) hours as needed for muscle spasms.  Dispense: 30 tablet;  Refill: 1  4. Supervision of high risk pregnancy, antepartum, second trimester Low risk NIPS, normal anatomy scan. AFP only today, Flu vaccien today.  - AFP, Serum, Open Spina Bifida - Flu Vaccine QUAD 36+ mos IM (Fluarix, Quad PF) Preterm labor symptoms and general obstetric  precautions including but not limited to vaginal bleeding, contractions, leaking of fluid and fetal movement were reviewed in detail with the patient. Please refer to After Visit Summary for other counseling recommendations.   Return for Weekly visits for 17P only; HOB visit and 17P in 4 weeks.   Jaynie Collins, MD

## 2017-12-28 NOTE — Patient Instructions (Signed)
Return to clinic for any scheduled appointments or obstetric concerns, or go to MAU for evaluation  

## 2017-12-30 LAB — AFP, SERUM, OPEN SPINA BIFIDA
AFP MOM: 1.07
AFP VALUE AFPOSL: 89.7 ng/mL
Gest. Age on Collection Date: 21.6 weeks
Maternal Age At EDD: 26 yr
OSBR RISK 1 IN: 10000
Test Results:: NEGATIVE
WEIGHT: 131 [lb_av]

## 2017-12-30 IMAGING — US US MFM OB TRANSVAGINAL
1 series · 15 of 28 positions shown · non-contrast
Comparison: none

[Series 1: us mfm ob transvaginal · 37 acquisitions, 15 frames shown]
[im 1/37]
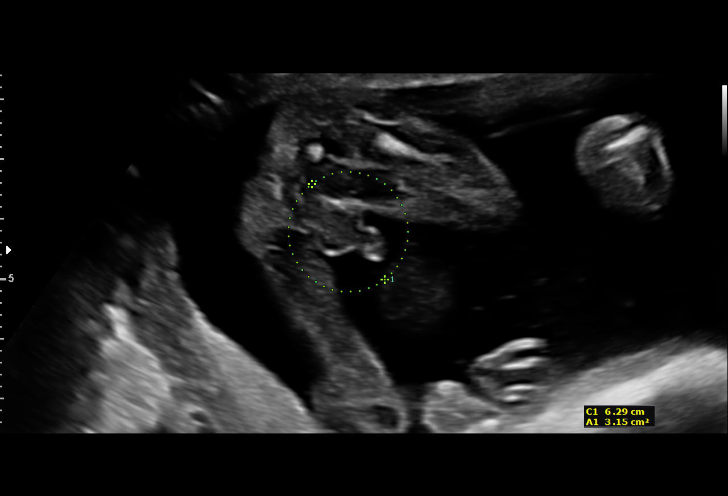
[im 3/37]
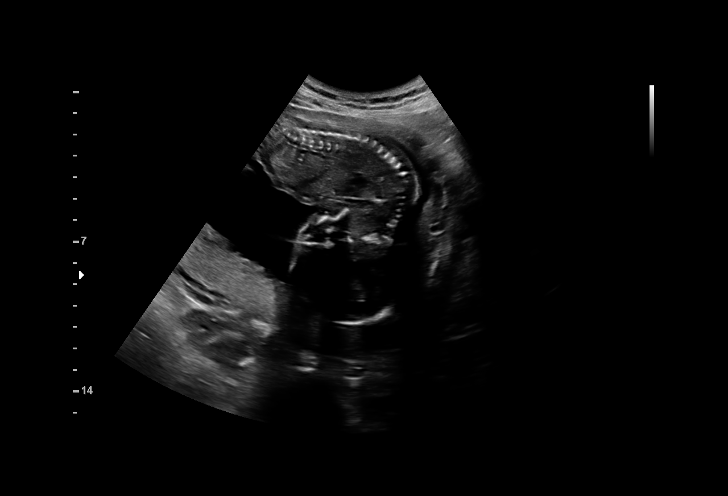
[im 6/37]
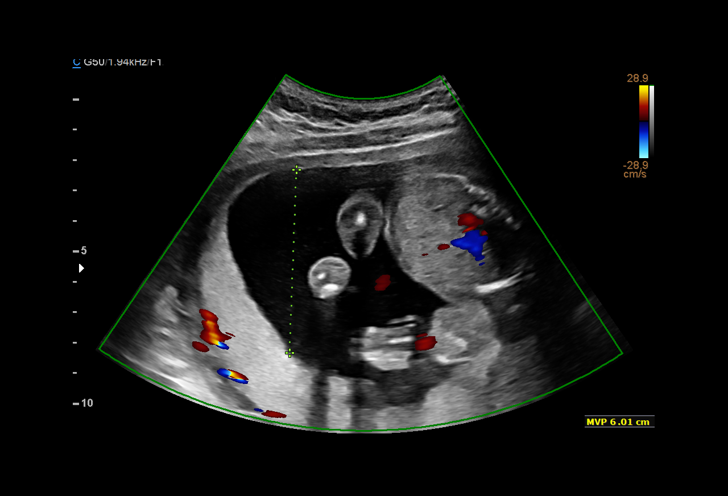
[im 9/37]
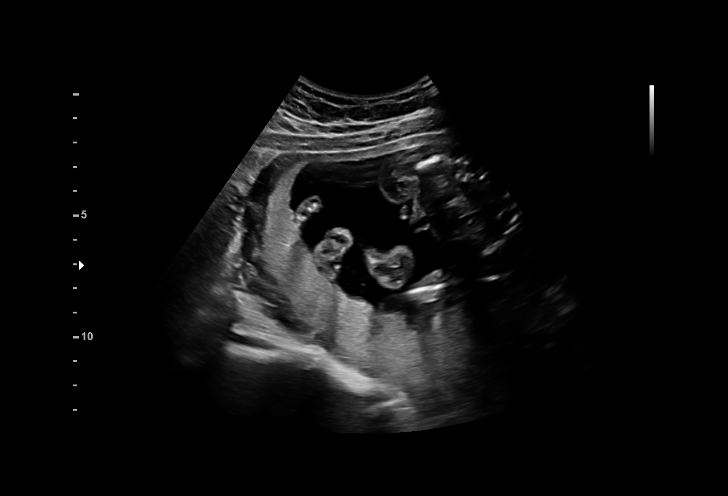
[im 11/37]
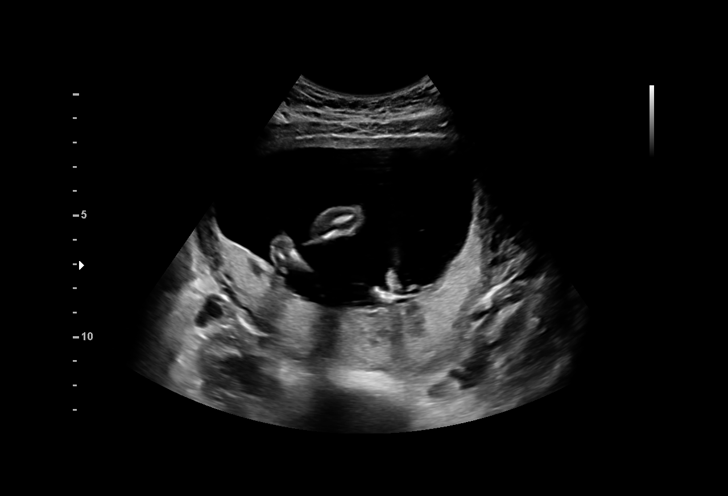
[im 14/37]
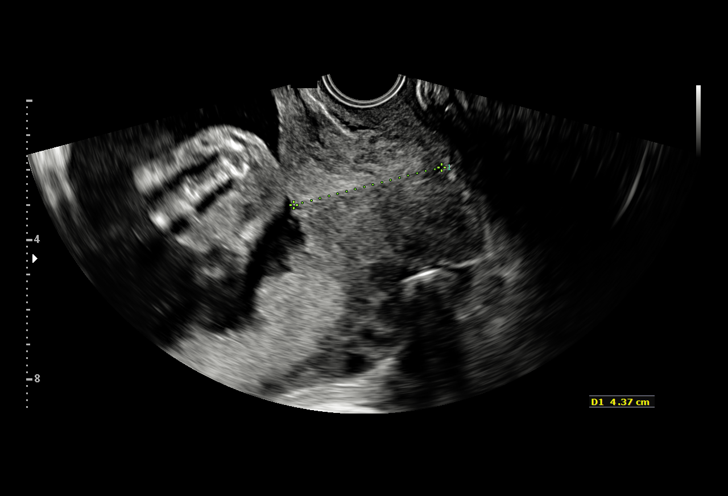
[im 17/37]
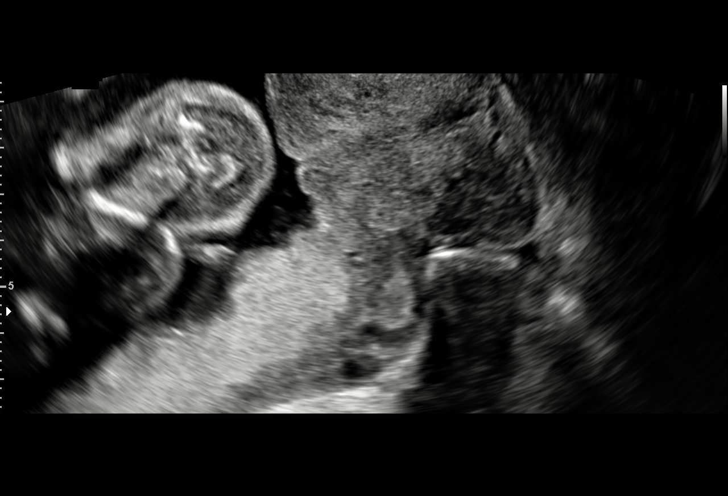
[im 19/37]
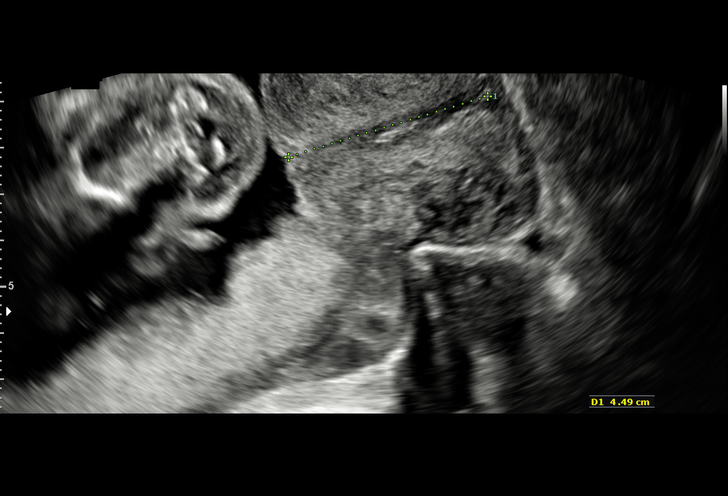
[im 21/37]
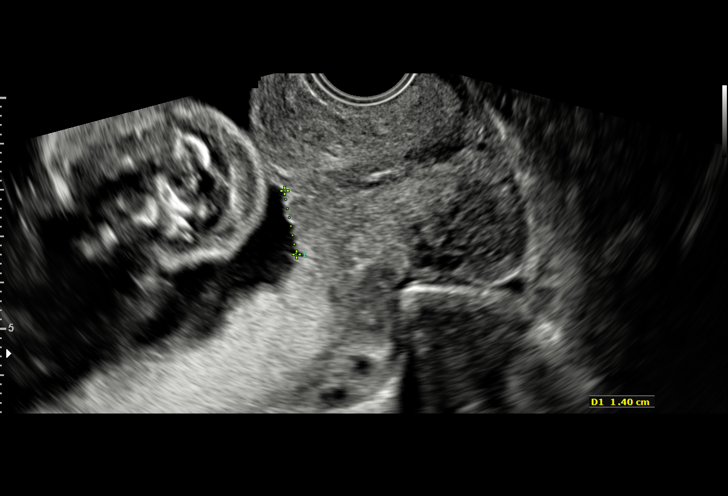
[im 23/37]
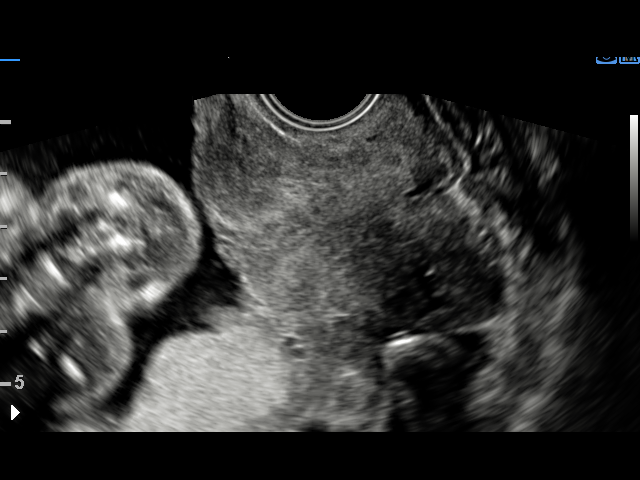
[im 26/37]
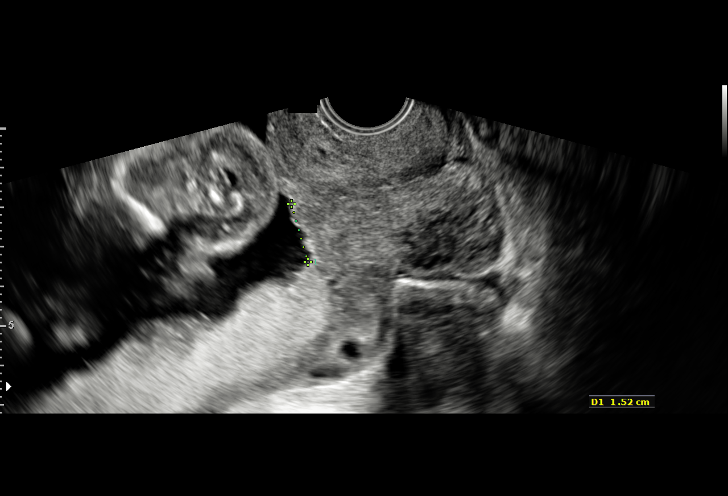
[im 29/37]
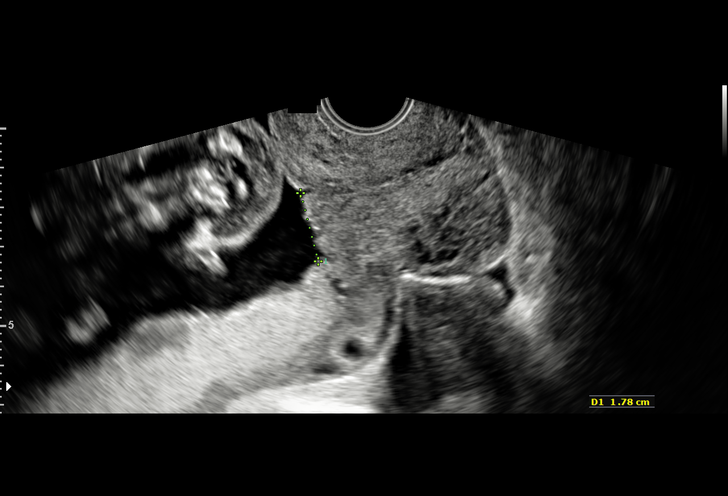
[im 31/37]
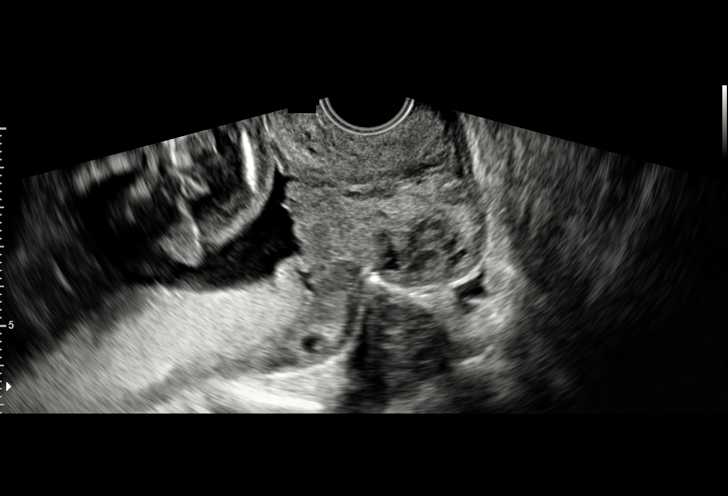
[im 34/37]
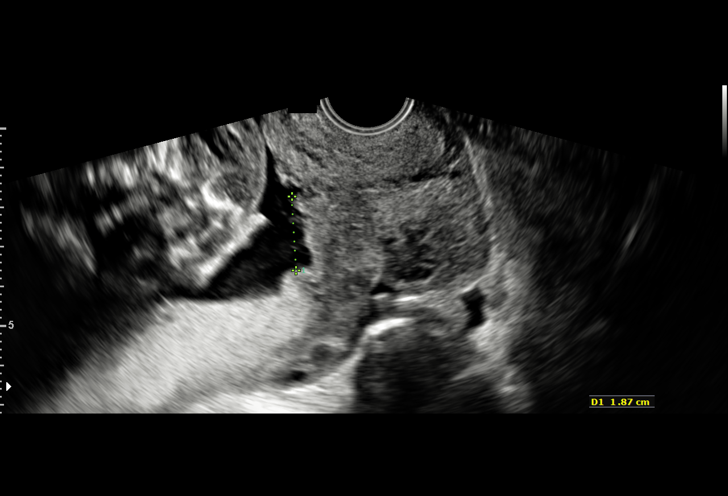
[im 37/37]
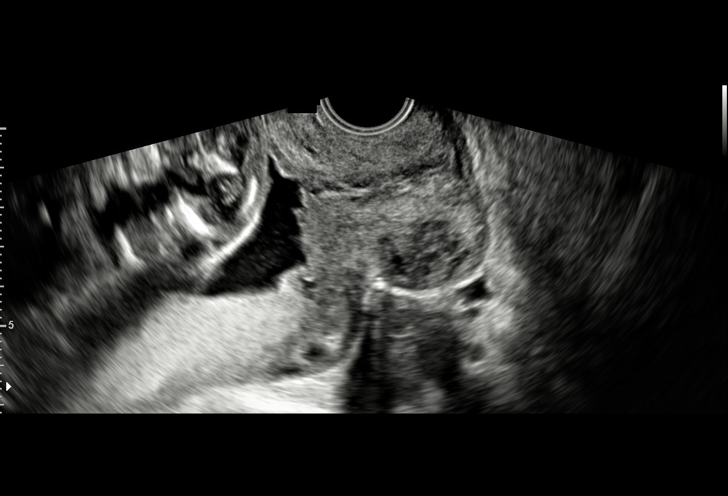

[15 of 28 positions shown; findings below may reference images not displayed]

1  VY PLA              [id number]      [id number]     [id number]
Indications

19 weeks gestation of pregnancy
Poor obstetric history: Previous preterm
delivery (20 wks), antepartum
OB History

Blood Type:            Height:  5'4"   Weight (lb):  123       BMI:
Gravidity:    3         Term:   0        Prem:   2         SAB:   0
TOP:          0       Ectopic:  0        Living: 1
Fetal Evaluation

Num Of Fetuses:     1
Fetal Heart         140
Rate(bpm):
Cardiac Activity:   Observed
Presentation:       Cephalic
Placenta:           Posterior, above cervical os

Amniotic Fluid
AFI FV:      Subjectively within normal limits

Largest Pocket(cm)
6.01
Gestational Age

Best:          19w 5d     Det. By:  Early Ultrasound         EDD:    12/14/16
(04/30/16)
Cervix Uterus Adnexa
Cervix
Length:            3.7  cm.
Normal appearance by transvaginal scan
Impression

SIUP at 19+5 weeks
Normal amniotic fluid volume
EV views of cervix: normal length without funneling
Posterior placenta; no previa
Recommendations

CL in 2 weeks

## 2018-01-04 ENCOUNTER — Ambulatory Visit: Payer: Self-pay

## 2018-01-11 ENCOUNTER — Ambulatory Visit: Payer: Self-pay

## 2018-01-18 ENCOUNTER — Ambulatory Visit: Payer: Medicaid Other | Admitting: *Deleted

## 2018-01-18 NOTE — Progress Notes (Signed)
I have called patient name in lobby 3 times and no one is answering.

## 2018-01-26 ENCOUNTER — Other Ambulatory Visit (HOSPITAL_COMMUNITY)
Admission: RE | Admit: 2018-01-26 | Discharge: 2018-01-26 | Disposition: A | Discharge status: Home / Self Care | Payer: Medicaid Other | Source: Ambulatory Visit | Attending: Family Medicine | Admitting: Family Medicine

## 2018-01-26 ENCOUNTER — Ambulatory Visit (INDEPENDENT_AMBULATORY_CARE_PROVIDER_SITE_OTHER): Payer: Medicaid Other | Admitting: Family Medicine

## 2018-01-26 VITALS — BP 107/68 | HR 109 | Wt 130.2 lb

## 2018-01-26 DIAGNOSIS — O0992 Supervision of high risk pregnancy, unspecified, second trimester: Secondary | ICD-10-CM

## 2018-01-26 DIAGNOSIS — Z8759 Personal history of other complications of pregnancy, childbirth and the puerperium: Secondary | ICD-10-CM

## 2018-01-26 DIAGNOSIS — O3430 Maternal care for cervical incompetence, unspecified trimester: Secondary | ICD-10-CM

## 2018-01-26 DIAGNOSIS — B379 Candidiasis, unspecified: Secondary | ICD-10-CM | POA: Insufficient documentation

## 2018-01-26 DIAGNOSIS — O09212 Supervision of pregnancy with history of pre-term labor, second trimester: Secondary | ICD-10-CM

## 2018-01-26 DIAGNOSIS — Z8742 Personal history of other diseases of the female genital tract: Secondary | ICD-10-CM

## 2018-01-26 DIAGNOSIS — Z98891 History of uterine scar from previous surgery: Secondary | ICD-10-CM

## 2018-01-26 MED ORDER — FLUCONAZOLE 150 MG PO TABS: 150.0000 mg | ORAL_TABLET | Freq: Once | ORAL | 0 refills | Status: AC

## 2018-01-26 NOTE — Progress Notes (Addendum)
See provider note fore details.

## 2018-01-26 NOTE — Progress Notes (Signed)
   PRENATAL VISIT NOTE Subjective:  Sherri Shepherd is a 27 y.o. [phone] at 1158w5d being seen today for ongoing prenatal care.  She is currently monitored for the following issues for this high-risk pregnancy and has History of preterm delivery, currently pregnant; Status post primary low transverse cesarean section; Cervical cerclage suture present, antepartum; Supervision of high risk pregnancy, antepartum, second trimester; and History of cervical incompetence on their problem list.  Patient reports pelvic pain and pressure. She states that she has been using tylenol, warm compresses, and flexeril with mild relief. She also reports vaginal itching and believes she may have a yeast infection.  Contractions: Not present. Vag. Bleeding: None.  Movement: Present. Denies leaking of fluid.   The following portions of the patient's history were reviewed and updated as appropriate: allergies, current medications, past family history, past medical history, past social history, past surgical history and problem list. Problem list updated.  Objective:   Vitals:   01/26/18 1538  BP: 107/68  Pulse: (!) 109  Weight: 130 lb 3.2 oz (59.1 kg)   Fetal Status: Fetal Heart Rate (bpm): 154 Fundal Height: 26 cm Movement: Present     General:  Alert, oriented and cooperative. Patient is in no acute distress.  Skin: Skin is warm and dry. No rash noted.   Cardiovascular: Normal heart rate noted  Respiratory: Normal respiratory effort, no problems with respiration noted  Abdomen: Soft, gravid, appropriate for gestational age.  Pain/Pressure: Present     Pelvic: Normal appearing labia  vaginal mucosa slightly erythematous with moderate amount of foul-smelling, thick white discharge  no tension seen on cerclage  cervix visually closed and appears > 3cm in length   Extremities: Normal range of motion.  Edema: None  Mental Status: Normal mood and affect. Normal behavior. Normal judgment and thought content.    Assessment and Plan:  Pregnancy: [id number] at 7558w5d  1.Supervision of high risk pregnancy, antepartum, second trimester -- prenatal records reviewed & UTD - counseled on needing 28-week labs at next visit  -- Low risk NIPS, normal anatomy scan, AFP screening negative -- pt received flu shot at last visit -- social work consult completed today  2. History of preterm delivery, currently pregnant -- receive 17P  3. Cervical cerclage suture present, antepartum -- Normal cerclage and cervical check today -- PTL precautions strongly advised  4. Pelvic pain affecting pregnancy -- continue Tylenol, warm compresses, and flexeril as needed  5. Vaginal itching  -- white discharge noted on speculum exam  -- cervicovaginal swab obtained and sent to lab for testing -- Prescribed Diflucan 150mg  tab, single dose  Preterm labor symptoms and general obstetric precautions including but not limited to vaginal bleeding, contractions, leaking of fluid and fetal movement were reviewed in detail with the patient. Please refer to After Visit Summary for other counseling recommendations.  Return in about 2 weeks (around 02/09/2018) for HROB.  Future Appointments  Date Time Provider Department Center  02/02/2018  2:30 PM [facility] NURSE [facility]  02/09/2018 10:00 AM [facility] LAB [facility]  02/09/2018 10:15 AM Constant, Gigi GinPeggy, MD [facility]    Attestation: I have seen this patient and agree with the physician assistant student's Denny Peon(Erin Elphick's) documentation. I have examined them separately, and we have discussed the plan of care.  Cristal DeerLaurel S. Earlene PlaterWallace, DO OB/GYN Fellow

## 2018-01-27 ENCOUNTER — Other Ambulatory Visit: Payer: Self-pay | Admitting: Family Medicine

## 2018-01-27 DIAGNOSIS — O23599 Infection of other part of genital tract in pregnancy, unspecified trimester: Principal | ICD-10-CM

## 2018-01-27 DIAGNOSIS — B9689 Other specified bacterial agents as the cause of diseases classified elsewhere: Secondary | ICD-10-CM

## 2018-01-27 LAB — CERVICOVAGINAL ANCILLARY ONLY
Bacterial vaginitis: POSITIVE — AB
Candida vaginitis: POSITIVE — AB
Chlamydia: NEGATIVE
Neisseria Gonorrhea: NEGATIVE
TRICH (WINDOWPATH): NEGATIVE

## 2018-01-27 MED ORDER — METRONIDAZOLE 500 MG PO TABS: 500.0000 mg | ORAL_TABLET | Freq: Two times a day (BID) | ORAL | 0 refills | Status: AC

## 2018-01-27 MED ORDER — METRONIDAZOLE 500 MG PO TABS: 500.0000 mg | ORAL_TABLET | Freq: Two times a day (BID) | ORAL | 0 refills | Status: DC

## 2018-02-02 ENCOUNTER — Ambulatory Visit: Payer: Self-pay

## 2018-02-02 ENCOUNTER — Telehealth: Payer: Self-pay | Admitting: Family Medicine

## 2018-02-02 NOTE — Telephone Encounter (Signed)
Called patient about missing her injection. Was not able to leave a VM.

## 2018-02-04 ENCOUNTER — Other Ambulatory Visit: Payer: Self-pay | Admitting: Family Medicine

## 2018-02-04 DIAGNOSIS — B9689 Other specified bacterial agents as the cause of diseases classified elsewhere: Secondary | ICD-10-CM

## 2018-02-04 DIAGNOSIS — N760 Acute vaginitis: Principal | ICD-10-CM

## 2018-02-04 MED ORDER — METRONIDAZOLE 500 MG PO TABS: 500.0000 mg | ORAL_TABLET | Freq: Two times a day (BID) | ORAL | 0 refills | Status: DC

## 2018-02-09 ENCOUNTER — Ambulatory Visit (INDEPENDENT_AMBULATORY_CARE_PROVIDER_SITE_OTHER): Payer: Medicaid Other | Admitting: Obstetrics and Gynecology

## 2018-02-09 ENCOUNTER — Other Ambulatory Visit: Payer: Medicaid Other

## 2018-02-09 ENCOUNTER — Encounter: Payer: Self-pay | Admitting: Obstetrics and Gynecology

## 2018-02-09 VITALS — BP 102/71 | HR 104 | Wt 132.3 lb

## 2018-02-09 DIAGNOSIS — O09219 Supervision of pregnancy with history of pre-term labor, unspecified trimester: Secondary | ICD-10-CM

## 2018-02-09 DIAGNOSIS — Z8759 Personal history of other complications of pregnancy, childbirth and the puerperium: Secondary | ICD-10-CM

## 2018-02-09 DIAGNOSIS — O0992 Supervision of high risk pregnancy, unspecified, second trimester: Secondary | ICD-10-CM

## 2018-02-09 DIAGNOSIS — O0990 Supervision of high risk pregnancy, unspecified, unspecified trimester: Secondary | ICD-10-CM

## 2018-02-09 DIAGNOSIS — Z23 Encounter for immunization: Secondary | ICD-10-CM

## 2018-02-09 DIAGNOSIS — O09899 Supervision of other high risk pregnancies, unspecified trimester: Secondary | ICD-10-CM

## 2018-02-09 DIAGNOSIS — O09212 Supervision of pregnancy with history of pre-term labor, second trimester: Secondary | ICD-10-CM | POA: Diagnosis not present

## 2018-02-09 DIAGNOSIS — Z98891 History of uterine scar from previous surgery: Secondary | ICD-10-CM

## 2018-02-09 DIAGNOSIS — Z8742 Personal history of other diseases of the female genital tract: Secondary | ICD-10-CM

## 2018-02-09 NOTE — Progress Notes (Signed)
   PRENATAL VISIT NOTE  Subjective:  Sherri Shepherd is a 27 y.o. [id number] at 3055w5d being seen today for ongoing prenatal care.  She is currently monitored for the following issues for this high-risk pregnancy and has History of preterm delivery, currently pregnant; Status post primary low transverse cesarean section; Cervical cerclage suture present, antepartum; Supervision of high risk pregnancy, antepartum, second trimester; and History of cervical incompetence on their problem list.  Patient reports no complaints.  Contractions: Not present. Vag. Bleeding: None.  Movement: Present. Denies leaking of fluid.   The following portions of the patient's history were reviewed and updated as appropriate: allergies, current medications, past family history, past medical history, past social history, past surgical history and problem list. Problem list updated.  Objective:   Vitals:   02/09/18 0958  BP: 102/71  Pulse: (!) 104  Weight: 132 lb 4.8 oz (60 kg)    Fetal Status: Fetal Heart Rate (bpm): 150 Fundal Height: 26 cm Movement: Present     General:  Alert, oriented and cooperative. Patient is in no acute distress.  Skin: Skin is warm and dry. No rash noted.   Cardiovascular: Normal heart rate noted  Respiratory: Normal respiratory effort, no problems with respiration noted  Abdomen: Soft, gravid, appropriate for gestational age.  Pain/Pressure: Present     Pelvic: Cervical exam deferred        Extremities: Normal range of motion.  Edema: None  Mental Status: Normal mood and affect. Normal behavior. Normal judgment and thought content.   Assessment and Plan:  Pregnancy: [id number] at 955w5d  1. History of preterm delivery, currently pregnant Continue weekly 17-P  2. History of cervical incompetence Patient has cerclage in place- stable  3. Supervision of high risk pregnancy, antepartum, second trimester Patient is doing well without complaints Third trimester labs and tdap  today  4. Status post primary low transverse cesarean section Patient desires repeat c-section She plans Nexplanon for contraception  Preterm labor symptoms and general obstetric precautions including but not limited to vaginal bleeding, contractions, leaking of fluid and fetal movement were reviewed in detail with the patient. Please refer to After Visit Summary for other counseling recommendations.  No follow-ups on file.  Future Appointments  Date Time Provider Department Center  02/09/2018 10:15 AM Raizel Wesolowski, Gigi GinPeggy, MD [facility]    Catalina AntiguaPeggy Khadeejah Castner, MD

## 2018-02-09 NOTE — Addendum Note (Signed)
Addended by: Faythe CasaBELLAMY, Chord Takahashi M on: 02/09/2018 10:11 AM   Modules accepted: Orders

## 2018-02-09 NOTE — Addendum Note (Signed)
Addended by: Faythe CasaBELLAMY, JEANETTA M on: 02/09/2018 10:24 AM   Modules accepted: Orders

## 2018-02-10 LAB — GLUCOSE TOLERANCE, 2 HOURS W/ 1HR
GLUCOSE, FASTING: 78 mg/dL (ref 65–91)
Glucose, 1 hour: 132 mg/dL (ref 65–179)
Glucose, 2 hour: 77 mg/dL (ref 65–152)

## 2018-02-10 LAB — CBC
HEMOGLOBIN: 10.1 g/dL — AB (ref 11.1–15.9)
Hematocrit: 30.9 % — ABNORMAL LOW (ref 34.0–46.6)
MCH: 30.6 pg (ref 26.6–33.0)
MCHC: 32.7 g/dL (ref 31.5–35.7)
MCV: 94 fL (ref 79–97)
Platelets: 234 10*3/uL (ref 150–450)
RBC: 3.3 x10E6/uL — AB (ref 3.77–5.28)
RDW: 14 % (ref 12.3–15.4)
WBC: 9.6 10*3/uL (ref 3.4–10.8)

## 2018-02-10 LAB — HIV ANTIBODY (ROUTINE TESTING W REFLEX): HIV SCREEN 4TH GENERATION: NONREACTIVE

## 2018-02-10 LAB — RPR: RPR: NONREACTIVE

## 2018-02-18 ENCOUNTER — Ambulatory Visit: Payer: Self-pay

## 2018-02-24 NOTE — L&D Delivery Note (Signed)
Patient: Sherri Shepherd MRN: [id number]  GBS status: negative, IAP given none  Patient is a 28 y.o. now [id number] s/Sherri NSVD at [redacted]w[redacted]d, who was admitted for SOL. SROM 8h 78m prior to delivery with clear fluid.    Delivery Note At 1:18 PM a viable and healthy female was delivered via VBAC, Spontaneous (Presentation: LOA; Vertex ).  APGAR: 8, 9; weight  Pending.   Placenta status: Spontaneous and intact.  Cord: 3 vessels with no complications.   Head delivered LOA. One nuchal cord present. Shoulder and body delivered in summersault fashion to relieve nuchal cord. Infant with spontaneous cry, placed on mother's abdomen, dried and bulb suctioned. Cord clamped x 2 after 1-minute delay, and cut by family member. Cord blood drawn. Placenta delivered spontaneously with gentle cord traction. Fundus firm with massage and Pitocin. Perineum inspected and found to have 1st degree laceration, which was repaired with 3-0 Vicryl with good hemostasis achieved.  Anesthesia: IV Pain meds and 1x dose from Epidural Episiotomy: None Lacerations: superficial Labial tear and 1st degree perineal tear Suture Repair: 3.0 vicryl Est. Blood Loss (mL): 221  Mom to postpartum.  Baby to Couplet care / Skin to Skin.  Sherri Shepherd Sherri Shepherd 04/17/2018, 2:00 PM

## 2018-02-25 ENCOUNTER — Encounter: Payer: Self-pay | Admitting: Obstetrics & Gynecology

## 2018-02-25 ENCOUNTER — Ambulatory Visit (INDEPENDENT_AMBULATORY_CARE_PROVIDER_SITE_OTHER): Payer: Medicaid Other | Admitting: Obstetrics & Gynecology

## 2018-02-25 VITALS — BP 122/70 | HR 103 | Wt 135.3 lb

## 2018-02-25 DIAGNOSIS — Z98891 History of uterine scar from previous surgery: Secondary | ICD-10-CM

## 2018-02-25 DIAGNOSIS — O09213 Supervision of pregnancy with history of pre-term labor, third trimester: Secondary | ICD-10-CM | POA: Diagnosis not present

## 2018-02-25 DIAGNOSIS — O0993 Supervision of high risk pregnancy, unspecified, third trimester: Secondary | ICD-10-CM | POA: Diagnosis not present

## 2018-02-25 DIAGNOSIS — O09219 Supervision of pregnancy with history of pre-term labor, unspecified trimester: Secondary | ICD-10-CM

## 2018-02-25 DIAGNOSIS — O09899 Supervision of other high risk pregnancies, unspecified trimester: Secondary | ICD-10-CM

## 2018-02-25 DIAGNOSIS — Z8759 Personal history of other complications of pregnancy, childbirth and the puerperium: Secondary | ICD-10-CM

## 2018-02-25 DIAGNOSIS — Z8742 Personal history of other diseases of the female genital tract: Secondary | ICD-10-CM

## 2018-02-25 DIAGNOSIS — O0992 Supervision of high risk pregnancy, unspecified, second trimester: Secondary | ICD-10-CM

## 2018-02-25 NOTE — Patient Instructions (Signed)
Vaginal Birth After Cesarean Delivery    Vaginal birth after cesarean delivery (VBAC) is giving birth vaginally after previously delivering a baby through a cesarean section (C-section). A VBAC may be a safe option for you, depending on your health and other factors.  It is important to discuss VBAC with your health care provider early in your pregnancy so you can understand the risks, benefits, and options. Having these discussions early will give you time to make your birth plan.  Who are the best candidates for VBAC?  The best candidates for VBAC are women who:   Have had one or two prior cesarean deliveries, and the incision made during the delivery was horizontal (low transverse).   Do not have a vertical (classical) scar on their uterus.   Have not had a tear in the wall of their uterus (uterine rupture).   Plan to have more pregnancies.  A VBAC is also more likely to be successful:   In women who have previously given birth vaginally.   When labor starts by itself (spontaneously) before the due date.  What are the benefits of VBAC?  The benefits of delivering your baby vaginally instead of by a cesarean delivery include:   A shorter hospital stay.   A faster recovery time.   Less pain.   Avoiding risks associated with major surgery, such as infection and blood clots.   Less blood loss and less need for donated blood (transfusions).  What are the risks of VBAC?  The main risk of attempting a VBAC is that it may fail, forcing your health care provider to deliver your baby by a C-section.  Other risks are rare and include:   Tearing (rupture) of the scar from a past cesarean delivery.   Other risks associated with vaginal deliveries.  If a repeat cesarean delivery is needed, the risks include:   Blood loss.   Infection.   Blood clot.   Damage to surrounding organs.   Removal of the uterus (hysterectomy), if it is damaged.   Placenta problems in future pregnancies.  What else should I know  about my options?  Delivering a baby through a VBAC is similar to having a normal spontaneous vaginal delivery. Therefore, it is safe:   To try with twins.   For your health care provider to try to turn the baby from a breech position (external cephalic version) during labor.   With epidural analgesia for pain relief.  Consider where you would like to deliver your baby. VBAC should be attempted in facilities where an emergency cesarean delivery can be performed. VBAC is not recommended for home births.  Any changes in your health or your baby's health during your pregnancy may make it necessary to change your initial decision about VBAC. Your health care provider may recommend that you do not attempt a VBAC if:   Your baby's suspected weight is 8.8 lb (4 kg) or more.   You have preeclampsia. This is a condition that causes high blood pressure along with other symptoms, such as swelling and headaches.   You will have VBAC less than 19 months after your cesarean delivery.   You are past your due date.   You need to have labor started (induced) because your cervix is not ready for labor (unfavorable).  Where to find more information   American Pregnancy Association: americanpregnancy.org   American Congress of Obstetricians and Gynecologists: acog.org  Summary   Vaginal birth after cesarean delivery (VBAC) is giving birth   vaginally after previously delivering a baby through a cesarean section (C-section). A VBAC may be a safe option for you, depending on your health and other factors.   Discuss VBAC with your health care provider early in your pregnancy so you can understand the risks, benefits, options, and have plenty of time to make your birth plan.   The main risk of attempting a VBAC is that it may fail, forcing your health care provider to deliver your baby by a C-section. Other risks are rare.  This information is not intended to replace advice given to you by your health care provider. Make sure  you discuss any questions you have with your health care provider.  Document Released: 08/03/2006 Document Revised: 05/20/2016 Document Reviewed: 05/20/2016  Elsevier Interactive Patient Education  2019 [employer]vier Inc[employer]

## 2018-02-25 NOTE — Progress Notes (Signed)
   PRENATAL VISIT NOTE  Subjective:  Sherri Shepherd is a 28 y.o. [phone] at [redacted]w[redacted]d being seen today for ongoing prenatal care.  She is currently monitored for the following issues for this high-risk pregnancy and has History of preterm delivery, currently pregnant; Status post primary low transverse cesarean section; Cervical cerclage suture present, antepartum; Supervision of high risk pregnancy, antepartum, second trimester; and History of cervical incompetence on their problem list.  Patient reports no complaints.  Contractions: Not present. Vag. Bleeding: None.  Movement: Present. Denies leaking of fluid.   The following portions of the patient's history were reviewed and updated as appropriate: allergies, current medications, past family history, past medical history, past social history, past surgical history and problem list. Problem list updated.  Objective:   Vitals:   02/25/18 1504  BP: 122/70  Pulse: (!) 103  Weight: 135 lb 4.8 oz (61.4 kg)    Fetal Status: Fetal Heart Rate (bpm): 149   Movement: Present     General:  Alert, oriented and cooperative. Patient is in no acute distress.  Skin: Skin is warm and dry. No rash noted.   Cardiovascular: Normal heart rate noted  Respiratory: Normal respiratory effort, no problems with respiration noted  Abdomen: Soft, gravid, appropriate for gestational age.  Pain/Pressure: Absent     Pelvic: Cervical exam deferred        Extremities: Normal range of motion.  Edema: None  Mental Status: Normal mood and affect. Normal behavior. Normal judgment and thought content.   Assessment and Plan:  Pregnancy: [id number] at [redacted]w[redacted]d  1. Supervision of high risk pregnancy, antepartum, second trimester 17 P  2. History of cervical incompetence   3. History of preterm delivery, currently pregnant   4. Status post primary low transverse cesarean section Had not signed TOLAC form, will review today, considering TOLAC  Preterm labor symptoms  and general obstetric precautions including but not limited to vaginal bleeding, contractions, leaking of fluid and fetal movement were reviewed in detail with the patient. Please refer to After Visit Summary for other counseling recommendations.  Return in about 2 weeks (around 03/11/2018).  No future appointments.  Scheryl Darter, MD

## 2018-03-01 ENCOUNTER — Encounter (HOSPITAL_COMMUNITY): Payer: Self-pay

## 2018-03-07 ENCOUNTER — Encounter (HOSPITAL_COMMUNITY): Payer: Self-pay | Admitting: *Deleted

## 2018-03-07 ENCOUNTER — Inpatient Hospital Stay (HOSPITAL_COMMUNITY)
Admission: AD | Admit: 2018-03-07 | Discharge: 2018-03-07 | Disposition: A | Discharge status: Home / Self Care | Payer: Medicaid Other | Source: Ambulatory Visit | Attending: Family Medicine | Admitting: Family Medicine

## 2018-03-07 DIAGNOSIS — Z3689 Encounter for other specified antenatal screening: Secondary | ICD-10-CM | POA: Diagnosis not present

## 2018-03-07 DIAGNOSIS — B379 Candidiasis, unspecified: Secondary | ICD-10-CM

## 2018-03-07 DIAGNOSIS — N3000 Acute cystitis without hematuria: Secondary | ICD-10-CM | POA: Diagnosis not present

## 2018-03-07 DIAGNOSIS — B9689 Other specified bacterial agents as the cause of diseases classified elsewhere: Secondary | ICD-10-CM | POA: Diagnosis not present

## 2018-03-07 DIAGNOSIS — O0992 Supervision of high risk pregnancy, unspecified, second trimester: Secondary | ICD-10-CM

## 2018-03-07 DIAGNOSIS — N760 Acute vaginitis: Secondary | ICD-10-CM | POA: Diagnosis not present

## 2018-03-07 DIAGNOSIS — M545 Low back pain: Secondary | ICD-10-CM | POA: Insufficient documentation

## 2018-03-07 DIAGNOSIS — Z3A31 31 weeks gestation of pregnancy: Secondary | ICD-10-CM | POA: Diagnosis not present

## 2018-03-07 DIAGNOSIS — O26893 Other specified pregnancy related conditions, third trimester: Secondary | ICD-10-CM | POA: Diagnosis not present

## 2018-03-07 DIAGNOSIS — O3433 Maternal care for cervical incompetence, third trimester: Secondary | ICD-10-CM

## 2018-03-07 DIAGNOSIS — D72829 Elevated white blood cell count, unspecified: Secondary | ICD-10-CM | POA: Diagnosis not present

## 2018-03-07 LAB — URINALYSIS, ROUTINE W REFLEX MICROSCOPIC
BILIRUBIN URINE: NEGATIVE
Glucose, UA: NEGATIVE mg/dL
Hgb urine dipstick: NEGATIVE
KETONES UR: NEGATIVE mg/dL
Nitrite: NEGATIVE
Protein, ur: NEGATIVE mg/dL
Specific Gravity, Urine: 1.021 (ref 1.005–1.030)
pH: 6 (ref 5.0–8.0)

## 2018-03-07 LAB — WET PREP, GENITAL
SPERM: NONE SEEN
TRICH WET PREP: NONE SEEN

## 2018-03-07 MED ORDER — TERCONAZOLE 0.4 % VA CREA: 1.0000 | TOPICAL_CREAM | Freq: Every day | VAGINAL | 0 refills | Status: DC

## 2018-03-07 MED ORDER — CEPHALEXIN 500 MG PO CAPS: 500.0000 mg | ORAL_CAPSULE | Freq: Four times a day (QID) | ORAL | 0 refills | Status: AC

## 2018-03-07 MED ORDER — METRONIDAZOLE 500 MG PO TABS: 500.0000 mg | ORAL_TABLET | Freq: Two times a day (BID) | ORAL | 0 refills | Status: DC

## 2018-03-07 MED ORDER — CYCLOBENZAPRINE HCL 10 MG PO TABS
10.0000 mg | ORAL_TABLET | Freq: Once | ORAL | Status: AC
  Administered 2018-03-07: 10 mg via ORAL
  Filled 2018-03-07: qty 1

## 2018-03-07 NOTE — MAU Provider Note (Signed)
History     CSN: [id number]  Arrival date and time: 03/07/18 [id number]   First Provider Initiated Contact with Patient 03/07/18 0122      Chief Complaint  Patient presents with  . Back Pain  . Pelvic Pain   HPI Sherri Shepherd is a 28 y.o. [phone] [id number] at 6519w3d who presents to MAU with chief complaint of worsening abdominal pressure and back pain. She Denies vaginal bleeding, leaking of fluid, decreased fetal movement, fever, falls, or recent illness.    Vaginal Pressure This is a recurring problem, onset about one month ago. Patient endorses sense of downward pressure in her abdomen which worsens with repositioning and walking. She states she was previously advised to purchase a maternity belt to manage this problem but she cannot afford to do so.   Back pain This is a recurring problem, onset in early December. Pain is located in her right lower back, reported as 4-5/10, intermittently radiates to her right flank. Patient denies aggravating or alleviating factors. She was previously prescribed Flexeril for low back pain but has not taken it in the past week "because it makes me feel like I'm having hallucinations". She consents to trying Flexeril in MAU.  OB History    Gravida  4   Para  3   Term  0   Preterm  3   AB  0   Living  2     SAB  0   TAB  0   Ectopic  0   Multiple  0   Live Births  2           Past Medical History:  Diagnosis Date  . Chlamydia Feb 2017  . H/O varicella   . Headache in pregnancy, antepartum, second trimester 06/25/2016  . Premature dilatation of cervix during pregnancy 10/01/2011   5/90%/-1  . Preterm delivery 12/04/2011   SVD at 6512w3d on 10/22/11    Past Surgical History:  Procedure Laterality Date  . CERVICAL CERCLAGE N/A 08/09/2016   Procedure: CERCLAGE CERVICAL;  Surgeon: Catalina Antiguaonstant, Peggy, MD;  Location: [facility];  Service: Gynecology;  Laterality: N/A;  . CERVICAL CERCLAGE N/A 11/25/2017   Procedure: CERCLAGE CERVICAL;  Surgeon:  Reva BoresPratt, Tanya S, MD;  [facility];  Service: Gynecology;  Laterality: N/A;  . CESAREAN SECTION N/A 11/20/2016   Procedure: CESAREAN SECTION;  Surgeon: Willodean RosenthalHarraway-Smith, Carolyn, MD;  [facility];  Service: Obstetrics;  Laterality: N/A;    Family History  Problem Relation Age of Onset  . Hypertension Mother   . Diabetes Brother   . Cancer Maternal Grandmother        LUNG  . Diabetes Maternal Grandfather   . Arthritis Paternal Grandmother   . Cancer Paternal Grandmother        BREAST  . Diabetes Maternal Uncle     Social History   Tobacco Use  . Smoking status: Never Smoker  . Smokeless tobacco: Never Used  Substance Use Topics  . Alcohol use: No  . Drug use: No    Allergies: No Known Allergies  Facility-Administered Medications Prior to Admission  Medication Dose Route Frequency Provider Last Rate Last Dose  . hydroxyprogesterone caproate (MAKENA) 250 mg/mL injection 250 mg  250 mg Intramuscular Weekly Conan Bowensavis, Kelly M, MD   250 mg at 02/25/18 1532   Medications Prior to Admission  Medication Sig Dispense Refill Last Dose  . acetaminophen (TYLENOL) 500 MG tablet Take 500 mg by mouth every 6 (six) hours as needed  for headache.    Past Week at Unknown time  . cyclobenzaprine (FLEXERIL) 10 MG tablet Take 1 tablet (10 mg total) by mouth every 8 (eight) hours as needed for muscle spasms. 30 tablet 1 Past Month at Unknown time  . Prenatal Vit-Fe Fumarate-FA (PRENATAL MULTIVITAMIN) TABS tablet Take 1 tablet by mouth daily at 12 noon. 90 tablet 6 03/06/2018 at Unknown time    Review of Systems  Respiratory: Negative for shortness of breath.   Gastrointestinal: Negative for abdominal pain, nausea and vomiting.  Genitourinary: Positive for vaginal pain. Negative for difficulty urinating, vaginal bleeding and vaginal discharge.  Musculoskeletal: Positive for back pain.  All other systems reviewed and are negative.  Physical Exam   Blood pressure  111/68, pulse 97, temperature 98.4 F (36.9 C), resp. rate 16, height 5\' 5"  (1.651 m), weight 62.1 kg, last menstrual period 08/05/2017, not currently breastfeeding.  Physical Exam  Nursing note and vitals reviewed. Constitutional: She is oriented to person, place, and time. She appears well-developed and well-nourished.  Cardiovascular: Normal rate.  Respiratory: Effort normal. No respiratory distress.  GI: She exhibits no distension. There is no abdominal tenderness. There is no rebound, no guarding and no CVA tenderness.  Genitourinary:    Vaginal discharge present.     Genitourinary Comments: Normal cerclage and cervical exam Thick white vaginal discharge on labia and throughout introitus Denies sexual intercourse   Neurological: She is alert and oriented to person, place, and time.  Skin: Skin is warm and dry.  Psychiatric: She has a normal mood and affect. Her behavior is normal. Judgment and thought content normal.    MAU Course/MDM   --Sterile speculum exam --Reactive tracing: baseline 145, moderate variability, positive accelerations, no decelerations --Toco: quiet. No contractions felt by patient --Back pain resolving with Flexeril --elevated leukocytes and WBCs. Discussed initiating ABX now vs waiting for culture. Patient requests rx now Patient Vitals for the past 24 hrs:  BP Temp Pulse Resp Height Weight  03/07/18 0202 (!) 106/55 - 91 18 - -  03/07/18 0101 111/68 - 97 - - -  03/07/18 0059 - 98.4 F (36.9 C) - 16 5\' 5"  (1.651 m) 62.1 kg    Results for orders placed or performed during the hospital encounter of 03/07/18 (from the past 24 hour(s))  Wet prep, genital     Status: Abnormal   Collection Time: 03/07/18  1:28 AM  Result Value Ref Range   Yeast Wet Prep HPF POC PRESENT (A) NONE SEEN   Trich, Wet Prep NONE SEEN NONE SEEN   Clue Cells Wet Prep HPF POC PRESENT (A) NONE SEEN   WBC, Wet Prep HPF POC MANY (A) NONE SEEN   Sperm NONE SEEN   Urinalysis, Routine w  reflex microscopic     Status: Abnormal   Collection Time: 03/07/18  1:37 AM  Result Value Ref Range   Color, Urine YELLOW YELLOW   APPearance HAZY (A) CLEAR   Specific Gravity, Urine 1.021 1.005 - 1.030   pH 6.0 5.0 - 8.0   Glucose, UA NEGATIVE NEGATIVE mg/dL   Hgb urine dipstick NEGATIVE NEGATIVE   Bilirubin Urine NEGATIVE NEGATIVE   Ketones, ur NEGATIVE NEGATIVE mg/dL   Protein, ur NEGATIVE NEGATIVE mg/dL   Nitrite NEGATIVE NEGATIVE   Leukocytes, UA LARGE (A) NEGATIVE   RBC / HPF 0-5 0 - 5 RBC/hpf   WBC, UA 21-50 0 - 5 WBC/hpf   Bacteria, UA RARE (A) NONE SEEN   Squamous Epithelial / LPF 6-10  0 - 5   Mucus PRESENT    Meds ordered this encounter  Medications  . cyclobenzaprine (FLEXERIL) tablet 10 mg  . terconazole (TERAZOL 7) 0.4 % vaginal cream    Sig: Place 1 applicator vaginally at bedtime. Use for seven days    Dispense:  45 g    Refill:  0    Order Specific Question:   Supervising Provider    Answer:   [name] [[id number]]  . metroNIDAZOLE (FLAGYL) 500 MG tablet    Sig: Take 1 tablet (500 mg total) by mouth 2 (two) times daily.    Dispense:  14 tablet    Refill:  0    Order Specific Question:   Supervising Provider    Answer:   [name] [[id number]]  . cephALEXin (KEFLEX) 500 MG capsule    Sig: Take 1 capsule (500 mg total) by mouth 4 (four) times daily for 10 days.    Dispense:  40 capsule    Refill:  0    Order Specific Question:   Supervising Provider    Answer:   [name] [[id number]]   Assessment and Plan  --28 y.o. [phone] at [redacted]w[redacted]d  --Reactive fetal tracing --Cerclage in place --Rx to preferred pharmacy as stated above --Discharge home in stable condition with preterm labor precautions  F/U: [facility] at [facility] 03/11/2018  Calvert Cantor, CNM 03/07/2018, 2:08 AM

## 2018-03-07 NOTE — MAU Note (Addendum)
I called the nurse cause I could feel my cerclage. Have a lot of pressure when walking or when I change positions when lying down. Denies vag bleeding or d/c. Back pain is sometimes R flank and sometimes lower back. Flexeril does not help

## 2018-03-07 NOTE — Discharge Instructions (Signed)

## 2018-03-11 ENCOUNTER — Encounter: Payer: Self-pay | Admitting: Obstetrics & Gynecology

## 2018-03-11 ENCOUNTER — Ambulatory Visit (INDEPENDENT_AMBULATORY_CARE_PROVIDER_SITE_OTHER): Payer: Medicaid Other | Admitting: Obstetrics & Gynecology

## 2018-03-11 VITALS — BP 118/69 | HR 87 | Wt 132.8 lb

## 2018-03-11 DIAGNOSIS — O0993 Supervision of high risk pregnancy, unspecified, third trimester: Secondary | ICD-10-CM | POA: Diagnosis not present

## 2018-03-11 DIAGNOSIS — Z98891 History of uterine scar from previous surgery: Secondary | ICD-10-CM

## 2018-03-11 DIAGNOSIS — O09213 Supervision of pregnancy with history of pre-term labor, third trimester: Secondary | ICD-10-CM

## 2018-03-11 DIAGNOSIS — O3433 Maternal care for cervical incompetence, third trimester: Secondary | ICD-10-CM

## 2018-03-11 DIAGNOSIS — O3430 Maternal care for cervical incompetence, unspecified trimester: Secondary | ICD-10-CM

## 2018-03-11 DIAGNOSIS — O0992 Supervision of high risk pregnancy, unspecified, second trimester: Secondary | ICD-10-CM

## 2018-03-11 NOTE — Progress Notes (Signed)
   PRENATAL VISIT NOTE  Subjective:  Sherri Shepherd is a 28 y.o. [phone] at [redacted]w[redacted]d being seen today for ongoing prenatal care.  She is currently monitored for the following issues for this low-risk pregnancy and has History of preterm delivery, currently pregnant; Status post primary low transverse cesarean section; Cervical cerclage suture present, antepartum; Supervision of high risk pregnancy, antepartum, second trimester; and History of cervical incompetence on their problem list.  Patient reports no complaints.  Contractions: Not present. Vag. Bleeding: None.  Movement: Present. Denies leaking of fluid.   The following portions of the patient's history were reviewed and updated as appropriate: allergies, current medications, past family history, past medical history, past social history, past surgical history and problem list. Problem list updated.  Objective:   Vitals:   03/11/18 1535  BP: 118/69  Pulse: 87  Weight: 132 lb 12.8 oz (60.2 kg)    Fetal Status: Fetal Heart Rate (bpm): 141   Movement: Present     General:  Alert, oriented and cooperative. Patient is in no acute distress.  Skin: Skin is warm and dry. No rash noted.   Cardiovascular: Normal heart rate noted  Respiratory: Normal respiratory effort, no problems with respiration noted  Abdomen: Soft, gravid, appropriate for gestational age.  Pain/Pressure: Present     Pelvic: Cervical exam deferred        Extremities: Normal range of motion.  Edema: None  Mental Status: Normal mood and affect. Normal behavior. Normal judgment and thought content.   Assessment and Plan:  Pregnancy: [id number] at [redacted]w[redacted]d  1. Cervical cerclage suture present, antepartum -cerclage in place - 17 P weekly  2. Status post primary low transverse cesarean section -plans RLTCS  3. Supervision of high risk pregnancy, antepartum, second trimester   Preterm labor symptoms and general obstetric precautions including but not limited to vaginal  bleeding, contractions, leaking of fluid and fetal movement were reviewed in detail with the patient. Please refer to After Visit Summary for other counseling recommendations.  No follow-ups on file.  Future Appointments  Date Time Provider Department Center  03/11/2018  3:55 PM Allie Bossier, MD [facility]    Allie Bossier, MD

## 2018-03-11 NOTE — Progress Notes (Signed)
Sherri Shepherd here for 17-P  Injection.  Injection administered without complication. Patient will return in one week for next injection.  Osvaldo Human, RN 03/11/2018  3:43 PM

## 2018-03-18 ENCOUNTER — Ambulatory Visit: Payer: Self-pay

## 2018-03-24 ENCOUNTER — Ambulatory Visit (INDEPENDENT_AMBULATORY_CARE_PROVIDER_SITE_OTHER): Payer: Medicaid Other | Admitting: Obstetrics & Gynecology

## 2018-03-24 VITALS — BP 107/71 | HR 102 | Wt 133.1 lb

## 2018-03-24 DIAGNOSIS — O09899 Supervision of other high risk pregnancies, unspecified trimester: Secondary | ICD-10-CM

## 2018-03-24 DIAGNOSIS — O3430 Maternal care for cervical incompetence, unspecified trimester: Secondary | ICD-10-CM

## 2018-03-24 DIAGNOSIS — Z8759 Personal history of other complications of pregnancy, childbirth and the puerperium: Secondary | ICD-10-CM | POA: Diagnosis not present

## 2018-03-24 DIAGNOSIS — O3433 Maternal care for cervical incompetence, third trimester: Secondary | ICD-10-CM | POA: Diagnosis not present

## 2018-03-24 DIAGNOSIS — O0993 Supervision of high risk pregnancy, unspecified, third trimester: Secondary | ICD-10-CM

## 2018-03-24 DIAGNOSIS — Z8742 Personal history of other diseases of the female genital tract: Secondary | ICD-10-CM

## 2018-03-24 DIAGNOSIS — O09213 Supervision of pregnancy with history of pre-term labor, third trimester: Secondary | ICD-10-CM

## 2018-03-24 DIAGNOSIS — O0992 Supervision of high risk pregnancy, unspecified, second trimester: Secondary | ICD-10-CM

## 2018-03-24 DIAGNOSIS — O09219 Supervision of pregnancy with history of pre-term labor, unspecified trimester: Principal | ICD-10-CM

## 2018-03-24 MED ORDER — MISC. DEVICES MISC: 0 refills | Status: DC

## 2018-03-24 NOTE — Progress Notes (Signed)
   PRENATAL VISIT NOTE  Subjective:  Sherri Shepherd is a 28 y.o. [phone] at [redacted]w[redacted]d being seen today for ongoing prenatal care.  She is currently monitored for the following issues for this high-risk pregnancy and has History of preterm delivery, currently pregnant; Status post primary low transverse cesarean section; Cervical cerclage suture present, antepartum; Supervision of high risk pregnancy, antepartum, second trimester; and History of cervical incompetence on their problem list.  Patient reports no complaints.  Contractions: Not present. Vag. Bleeding: None.  Movement: Present. Denies leaking of fluid.   The following portions of the patient's history were reviewed and updated as appropriate: allergies, current medications, past family history, past medical history, past social history, past surgical history and problem list. Problem list updated.  Objective:   Vitals:   03/24/18 1634  BP: 107/71  Pulse: (!) 102  Weight: 133 lb 1.6 oz (60.4 kg)    Fetal Status: Fetal Heart Rate (bpm): 145 Fundal Height: 32 cm Movement: Present     General:  Alert, oriented and cooperative. Patient is in no acute distress.  Skin: Skin is warm and dry. No rash noted.   Cardiovascular: Normal heart rate noted  Respiratory: Normal respiratory effort, no problems with respiration noted  Abdomen: Soft, gravid, appropriate for gestational age.  Pain/Pressure: Present     Pelvic: Cervical exam deferred        Extremities: Normal range of motion.  Edema: None  Mental Status: Normal mood and affect. Normal behavior. Normal judgment and thought content.   Assessment and Plan:  Pregnancy: [id number] at [redacted]w[redacted]d  1. History of preterm delivery, currently pregnant 2. Cervical cerclage suture present, antepartum 3. History of cervical incompetence Continue weekly 17P. Cerclage to be removed at time of section.  4. Supervision of high risk pregnancy, antepartum, second trimester Patient wants maternity  support belt - Misc. Devices MISC; Dispense one maternity belt for patient  Dispense: 1 each; Refill: 0  Preterm labor symptoms and general obstetric precautions including but not limited to vaginal bleeding, contractions, leaking of fluid and fetal movement were reviewed in detail with the patient. Please refer to After Visit Summary for other counseling recommendations.  Return in about 1 week (around 03/31/2018) for 17 P only. 2 weeks: 17 P and OB visit.  No future appointments.  Jaynie Collins, MD

## 2018-03-24 NOTE — Patient Instructions (Signed)
Return to clinic for any scheduled appointments or obstetric concerns, or go to MAU for evaluation  

## 2018-03-31 ENCOUNTER — Ambulatory Visit: Payer: Self-pay

## 2018-04-06 ENCOUNTER — Inpatient Hospital Stay (HOSPITAL_COMMUNITY)
Admission: AD | Admit: 2018-04-06 | Discharge: 2018-04-06 | Disposition: A | Discharge status: Home / Self Care | Payer: Medicaid Other | Attending: Obstetrics & Gynecology | Admitting: Obstetrics & Gynecology

## 2018-04-06 ENCOUNTER — Encounter (HOSPITAL_COMMUNITY): Payer: Self-pay | Admitting: *Deleted

## 2018-04-06 ENCOUNTER — Telehealth: Payer: Self-pay | Admitting: General Practice

## 2018-04-06 ENCOUNTER — Other Ambulatory Visit: Payer: Self-pay

## 2018-04-06 DIAGNOSIS — N949 Unspecified condition associated with female genital organs and menstrual cycle: Secondary | ICD-10-CM

## 2018-04-06 DIAGNOSIS — O4703 False labor before 37 completed weeks of gestation, third trimester: Secondary | ICD-10-CM

## 2018-04-06 DIAGNOSIS — O26893 Other specified pregnancy related conditions, third trimester: Secondary | ICD-10-CM

## 2018-04-06 DIAGNOSIS — Z3689 Encounter for other specified antenatal screening: Secondary | ICD-10-CM

## 2018-04-06 DIAGNOSIS — R102 Pelvic and perineal pain: Secondary | ICD-10-CM | POA: Diagnosis present

## 2018-04-06 DIAGNOSIS — Z3A35 35 weeks gestation of pregnancy: Secondary | ICD-10-CM

## 2018-04-06 DIAGNOSIS — O3433 Maternal care for cervical incompetence, third trimester: Secondary | ICD-10-CM

## 2018-04-06 LAB — URINALYSIS, MICROSCOPIC (REFLEX): RBC / HPF: NONE SEEN RBC/hpf (ref 0–5)

## 2018-04-06 LAB — URINALYSIS, ROUTINE W REFLEX MICROSCOPIC
Glucose, UA: NEGATIVE mg/dL
Hgb urine dipstick: NEGATIVE
Ketones, ur: 15 mg/dL — AB
Nitrite: NEGATIVE
Protein, ur: 100 mg/dL — AB
Specific Gravity, Urine: 1.025 (ref 1.005–1.030)
pH: 5.5 (ref 5.0–8.0)

## 2018-04-06 MED ORDER — CYCLOBENZAPRINE HCL 10 MG PO TABS
10.0000 mg | ORAL_TABLET | Freq: Once | ORAL | Status: AC
  Administered 2018-04-06: 10 mg via ORAL
  Filled 2018-04-06: qty 1

## 2018-04-06 MED ORDER — NIFEDIPINE 10 MG PO CAPS
10.0000 mg | ORAL_CAPSULE | Freq: Once | ORAL | Status: AC
  Administered 2018-04-06: 10 mg via ORAL
  Filled 2018-04-06: qty 1

## 2018-04-06 NOTE — MAU Provider Note (Signed)
History     CSN: [id number]  Arrival date and time: 04/06/18 1115   First Provider Initiated Contact with Patient 04/06/18 1157      Chief Complaint  Patient presents with  . Contractions   Sherri Shepherd is a 28 y.o. at [redacted]w[redacted]d who presents to MAU with complaints of pelvic pressure and back pain. She reports pelvic pressure and back pain has been occurring for the past week but got worse this morning. She rates pain 6/10- has not taken any medication for pain. Has cerclage in place, denies vaginal bleeding or discharge but reports "feels like baby is pushing down on her cervix". Has prenatal appointment scheduled for tomorrow, +FM.    OB History    Gravida  4   Para  3   Term  0   Preterm  3   AB  0   Living  2     SAB  0   TAB  0   Ectopic  0   Multiple  0   Live Births  2           Past Medical History:  Diagnosis Date  . Chlamydia Feb 2017  . H/O varicella   . Headache in pregnancy, antepartum, second trimester 06/25/2016  . Premature dilatation of cervix during pregnancy 10/01/2011   5/90%/-1  . Preterm delivery 12/04/2011   SVD at [redacted]w[redacted]d on 10/22/11    Past Surgical History:  Procedure Laterality Date  . CERVICAL CERCLAGE N/A 08/09/2016   Procedure: CERCLAGE CERVICAL;  Surgeon: Catalina Antigua, MD;  Location: [facility];  Service: Gynecology;  Laterality: N/A;  . CERVICAL CERCLAGE N/A 11/25/2017   Procedure: CERCLAGE CERVICAL;  Surgeon: [name], MD;  Location: [facility] BIRTHING SUITES;  Service: Gynecology;  Laterality: N/A;  . CESAREAN SECTION N/A 11/20/2016   Procedure: CESAREAN SECTION;  Surgeon: Willodean Rosenthal, MD;  Location: [facility] BIRTHING SUITES;  Service: Obstetrics;  Laterality: N/A;    Family History  Problem Relation Age of Onset  . Hypertension Mother   . Diabetes Brother   . Cancer Maternal Grandmother        LUNG  . Diabetes Maternal Grandfather   . Arthritis Paternal Grandmother   . Cancer Paternal Grandmother        BREAST   . Diabetes Maternal Uncle     Social History   Tobacco Use  . Smoking status: Never Smoker  . Smokeless tobacco: Never Used  Substance Use Topics  . Alcohol use: No  . Drug use: No    Allergies: No Known Allergies  Facility-Administered Medications Prior to Admission  Medication Dose Route Frequency Provider Last Rate Last Dose  . hydroxyprogesterone caproate (MAKENA) 250 mg/mL injection 250 mg  250 mg Intramuscular Weekly Conan Bowens, MD   250 mg at 03/24/18 1654   Medications Prior to Admission  Medication Sig Dispense Refill Last Dose  . acetaminophen (TYLENOL) 500 MG tablet Take 500 mg by mouth every 6 (six) hours as needed for headache.    Taking  . cyclobenzaprine (FLEXERIL) 10 MG tablet Take 1 tablet (10 mg total) by mouth every 8 (eight) hours as needed for muscle spasms. 30 tablet 1 Taking  . metroNIDAZOLE (FLAGYL) 500 MG tablet Take 1 tablet (500 mg total) by mouth 2 (two) times daily. 14 tablet 0 Taking  . Misc. Devices MISC Dispense one maternity belt for patient 1 each 0   . Prenatal Vit-Fe Fumarate-FA (PRENATAL MULTIVITAMIN) TABS tablet Take 1 tablet by mouth daily  at 12 noon. 90 tablet 6 Taking  . terconazole (TERAZOL 7) 0.4 % vaginal cream Place 1 applicator vaginally at bedtime. Use for seven days 45 g 0 Taking    Review of Systems  Constitutional: Negative.   Respiratory: Negative.   Cardiovascular: Negative.   Gastrointestinal: Positive for abdominal pain. Negative for constipation, diarrhea, nausea and vomiting.       Contractions  Genitourinary: Positive for pelvic pain. Negative for difficulty urinating, dysuria, frequency, hematuria and vaginal bleeding.       Pelvic pressure  Musculoskeletal: Positive for back pain.   Physical Exam   Blood pressure 123/87, pulse (!) 109, temperature 97.6 F (36.4 C), resp. rate 16, height 5\' 5"  (1.651 m), weight 60.3 kg, last menstrual period 08/05/2017, SpO2 100 %, not currently breastfeeding.  Physical Exam   Nursing note and vitals reviewed. Constitutional: She appears well-developed and well-nourished. No distress.  Cardiovascular: Normal rate, regular rhythm and normal heart sounds.  Respiratory: Effort normal and breath sounds normal. No respiratory distress. She has no wheezes. She has no rales.  GI: Soft. There is no abdominal tenderness. There is no rebound and no guarding.  Gravid appropriate for gestational age, mild contractions palpated   Genitourinary:    No vaginal discharge or bleeding.  No bleeding in the vagina.    Genitourinary Comments: Pelvic exam: Cervix pink, visually closed, without lesion, cerclage in place- no bulging around cerclage noted, normal discharge present, vaginal walls and external genitalia normal Cervical examination: Cervix 0/long/high,   Musculoskeletal: Normal range of motion.        General: No edema.   Cervix closed/thick  MAU Course  Procedures  MDM Flexeril and Procardia for maternal comfort   Upon reassessment patient asleep in room, reports pain is completely relieved and reports no contractions  FHR:135/ moderate/ +accels/ no decelerations  Toco: occasional mild contractions   NST reactive. Discussed reasons to return to MAU, follow up as scheduled in the office tomorrow and plan for cerclage removal around 36 weeks. Pt stable at time of discharge.   Assessment and Plan   1. Preterm uterine contractions in third trimester, antepartum   2. Cervical cerclage suture present, third trimester   3. [redacted] weeks gestation of pregnancy   4. NST (non-stress test) reactive   5. Pelvic pressure in pregnancy, antepartum, third trimester    Discharge home Return to MAU as needed  Follow up as scheduled   Follow-up Information    [facility]. Go on 04/07/2018.   Specialty:  Obstetrics and Gynecology Why:  Go to appointment tomorrow as scheduled  Contact information: [address] [place]  [id number] [phone]          Sharyon Cable CNM 04/06/2018, 1:35 PM

## 2018-04-06 NOTE — Discharge Instructions (Signed)
Braxton Hicks Contractions Contractions of the uterus can occur throughout pregnancy, but they are not always a sign that you are in labor. You may have practice contractions called Braxton Hicks contractions. These false labor contractions are sometimes confused with true labor. What are Braxton Hicks contractions? Braxton Hicks contractions are tightening movements that occur in the muscles of the uterus before labor. Unlike true labor contractions, these contractions do not result in opening (dilation) and thinning of the cervix. Toward the end of pregnancy (32-34 weeks), Braxton Hicks contractions can happen more often and may become stronger. These contractions are sometimes difficult to tell apart from true labor because they can be very uncomfortable. You should not feel embarrassed if you go to the hospital with false labor. Sometimes, the only way to tell if you are in true labor is for your health care provider to look for changes in the cervix. The health care provider will do a physical exam and may monitor your contractions. If you are not in true labor, the exam should show that your cervix is not dilating and your water has not broken. If there are no other health problems associated with your pregnancy, it is completely safe for you to be sent home with false labor. You may continue to have Braxton Hicks contractions until you go into true labor. How to tell the difference between true labor and false labor True labor  Contractions last 30-70 seconds.  Contractions become very regular.  Discomfort is usually felt in the top of the uterus, and it spreads to the lower abdomen and low back.  Contractions do not go away with walking.  Contractions usually become more intense and increase in frequency.  The cervix dilates and gets thinner. False labor  Contractions are usually shorter and not as strong as true labor contractions.  Contractions are usually irregular.  Contractions  are often felt in the front of the lower abdomen and in the groin.  Contractions may go away when you walk around or change positions while lying down.  Contractions get weaker and are shorter-lasting as time goes on.  The cervix usually does not dilate or become thin. Follow these instructions at home:   Take over-the-counter and prescription medicines only as told by your health care provider.  Keep up with your usual exercises and follow other instructions from your health care provider.  Eat and drink lightly if you think you are going into labor.  If Braxton Hicks contractions are making you uncomfortable: ? Change your position from lying down or resting to walking, or change from walking to resting. ? Sit and rest in a tub of warm water. ? Drink enough fluid to keep your urine pale yellow. Dehydration may cause these contractions. ? Do slow and deep breathing several times an hour.  Keep all follow-up prenatal visits as told by your health care provider. This is important. Contact a health care provider if:  You have a fever.  You have continuous pain in your abdomen. Get help right away if:  Your contractions become stronger, more regular, and closer together.  You have fluid leaking or gushing from your vagina.  You pass blood-tinged mucus (bloody show).  You have bleeding from your vagina.  You have low back pain that you never had before.  You feel your baby's head pushing down and causing pelvic pressure.  Your baby is not moving inside you as much as it used to. Summary  Contractions that occur before labor are   called Braxton Hicks contractions, false labor, or practice contractions.  Braxton Hicks contractions are usually shorter, weaker, farther apart, and less regular than true labor contractions. True labor contractions usually become progressively stronger and regular, and they become more frequent.  Manage discomfort from Braxton Hicks contractions  by changing position, resting in a warm bath, drinking plenty of water, or practicing deep breathing. This information is not intended to replace advice given to you by your health care provider. Make sure you discuss any questions you have with your health care provider. Document Released: 06/26/2016 Document Revised: 11/25/2016 Document Reviewed: 06/26/2016 Elsevier Interactive Patient Education  2019 [employer] Inc[employer]  

## 2018-04-06 NOTE — MAU Note (Deleted)
Brought to rm via wc.  Contractions started this morning, getting closer and stronger. No bleeding or leaking.

## 2018-04-06 NOTE — MAU Note (Signed)
Fetal tracing from 1125-1130 belongs to Med record # [id number]

## 2018-04-06 NOTE — Telephone Encounter (Signed)
Patient called into front office with concern about vaginal pressure & requested a call back. Per chart review, patient has appt tomorrow.  Called patient and she states over the past few days she has had increasing amounts of pelvic pressure/pain. Patient states it feels like the baby is pushing down on her cervix. Patient also reports abdominal tightness like contractions. Discussed with patient pelvic pressure/pain can be normal at this point in pregnancy. Discussed if pressure or pain is not severe or if contractions are not occurring frequently we can just check on her tomorrow at her appt. Patient states she never goes to term with any of her pregnancies and has contractions but never feels them & goes into labor quickly. Patient states that is why she is concerned. Discussed with patient only she can know how she feels and if this feels similar to past labor experiences. Advised she go to MAU for evaluation if she feels this is similar to past labors and she is concerned. Patient verbalized understanding & had no questions.

## 2018-04-07 ENCOUNTER — Other Ambulatory Visit (HOSPITAL_COMMUNITY)
Admission: RE | Admit: 2018-04-07 | Discharge: 2018-04-07 | Disposition: A | Discharge status: Home / Self Care | Payer: Medicaid Other | Source: Ambulatory Visit | Attending: Obstetrics & Gynecology | Admitting: Obstetrics & Gynecology

## 2018-04-07 ENCOUNTER — Ambulatory Visit (INDEPENDENT_AMBULATORY_CARE_PROVIDER_SITE_OTHER): Payer: Medicaid Other | Admitting: Obstetrics & Gynecology

## 2018-04-07 VITALS — BP 116/76 | HR 86 | Wt 131.8 lb

## 2018-04-07 DIAGNOSIS — O3433 Maternal care for cervical incompetence, third trimester: Secondary | ICD-10-CM

## 2018-04-07 DIAGNOSIS — O09213 Supervision of pregnancy with history of pre-term labor, third trimester: Secondary | ICD-10-CM | POA: Diagnosis not present

## 2018-04-07 DIAGNOSIS — O0992 Supervision of high risk pregnancy, unspecified, second trimester: Secondary | ICD-10-CM | POA: Insufficient documentation

## 2018-04-07 DIAGNOSIS — Z98891 History of uterine scar from previous surgery: Secondary | ICD-10-CM

## 2018-04-07 DIAGNOSIS — O0993 Supervision of high risk pregnancy, unspecified, third trimester: Secondary | ICD-10-CM

## 2018-04-07 NOTE — Progress Notes (Signed)
   PRENATAL VISIT NOTE  Subjective:  Sherri Shepherd is a 28 y.o. [phone] at [redacted]w[redacted]d being seen today for ongoing prenatal care.  She is currently monitored for the following issues for this high-risk pregnancy and has History of preterm delivery, currently pregnant; Status post primary low transverse cesarean section; Cervical cerclage suture present, antepartum; Supervision of high risk pregnancy, antepartum, second trimester; and History of cervical incompetence on their problem list.  Patient reports no complaints.  Contractions: Irregular. Vag. Bleeding: None.  Movement: Present. Denies leaking of fluid.   The following portions of the patient's history were reviewed and updated as appropriate: allergies, current medications, past family history, past medical history, past social history, past surgical history and problem list. Problem list updated.  Objective:   Vitals:   04/07/18 1435  BP: 116/76  Pulse: 86  Weight: 131 lb 12.8 oz (59.8 kg)    Fetal Status: Fetal Heart Rate (bpm): 154   Movement: Present     General:  Alert, oriented and cooperative. Patient is in no acute distress.  Skin: Skin is warm and dry. No rash noted.   Cardiovascular: Normal heart rate noted  Respiratory: Normal respiratory effort, no problems with respiration noted  Abdomen: Soft, gravid, appropriate for gestational age.  Pain/Pressure: Present     Pelvic: Cervical exam performed        Extremities: Normal range of motion.  Edema: None  Mental Status: Normal mood and affect. Normal behavior. Normal judgment and thought content.   Assessment and Plan:  Pregnancy: [id number] at [redacted]w[redacted]d She had a c/s in the past with last baby. She would like a TOLAC. Her cerclage will be removed at next visit.   There are no diagnoses linked to this encounter. Preterm labor symptoms and general obstetric precautions including but not limited to vaginal bleeding, contractions, leaking of fluid and fetal movement were  reviewed in detail with the patient. Please refer to After Visit Summary for other counseling recommendations.  No follow-ups on file.  No future appointments.  Allie Bossier, MD

## 2018-04-07 NOTE — Addendum Note (Signed)
Addended by: Faythe Casa on: 04/07/2018 03:01 PM   Modules accepted: Orders

## 2018-04-08 LAB — GC/CHLAMYDIA PROBE AMP (~~LOC~~) NOT AT ARMC
Chlamydia: NEGATIVE
Neisseria Gonorrhea: NEGATIVE

## 2018-04-09 ENCOUNTER — Encounter (HOSPITAL_COMMUNITY): Payer: Self-pay | Admitting: *Deleted

## 2018-04-09 ENCOUNTER — Inpatient Hospital Stay (HOSPITAL_COMMUNITY)
Admission: AD | Admit: 2018-04-09 | Discharge: 2018-04-09 | Disposition: A | Discharge status: Home / Self Care | Payer: Medicaid Other | Attending: Obstetrics and Gynecology | Admitting: Obstetrics and Gynecology

## 2018-04-09 DIAGNOSIS — O479 False labor, unspecified: Secondary | ICD-10-CM

## 2018-04-09 DIAGNOSIS — Z9889 Other specified postprocedural states: Secondary | ICD-10-CM

## 2018-04-09 DIAGNOSIS — Z3689 Encounter for other specified antenatal screening: Secondary | ICD-10-CM

## 2018-04-09 DIAGNOSIS — Z3A36 36 weeks gestation of pregnancy: Secondary | ICD-10-CM | POA: Diagnosis not present

## 2018-04-09 DIAGNOSIS — O4703 False labor before 37 completed weeks of gestation, third trimester: Secondary | ICD-10-CM | POA: Diagnosis not present

## 2018-04-09 DIAGNOSIS — O4700 False labor before 37 completed weeks of gestation, unspecified trimester: Secondary | ICD-10-CM

## 2018-04-09 DIAGNOSIS — R102 Pelvic and perineal pain: Secondary | ICD-10-CM | POA: Diagnosis present

## 2018-04-09 LAB — URINALYSIS, MICROSCOPIC (REFLEX)

## 2018-04-09 LAB — WET PREP, GENITAL
Clue Cells Wet Prep HPF POC: NONE SEEN
Sperm: NONE SEEN
Trich, Wet Prep: NONE SEEN
YEAST WET PREP: NONE SEEN

## 2018-04-09 LAB — URINALYSIS, ROUTINE W REFLEX MICROSCOPIC
Bilirubin Urine: NEGATIVE
Glucose, UA: NEGATIVE mg/dL
KETONES UR: NEGATIVE mg/dL
Leukocytes,Ua: NEGATIVE
Nitrite: NEGATIVE
Protein, ur: NEGATIVE mg/dL
Specific Gravity, Urine: 1.02 (ref 1.005–1.030)
pH: 6 (ref 5.0–8.0)

## 2018-04-09 MED ORDER — LACTATED RINGERS IV SOLN
Freq: Once | INTRAVENOUS | Status: AC
  Administered 2018-04-09: 19:00:00 via INTRAVENOUS

## 2018-04-09 NOTE — MAU Provider Note (Signed)
History     CSN: [id number]  Arrival date and time: 04/09/18 1729   First Provider Initiated Contact with Patient 04/09/18 1820      Chief Complaint  Patient presents with  . pelvic pressure  . Contractions   HPI Sherri Shepherd is a 28 y.o. [phone] at [redacted]w[redacted]d who presents to MAU with report of perineal pressure and irregular contractions, these are recurring problems. Patient has a cervical cerclage in place, would like to [facility]. She rates her pain as 8/10. Her pain is located bilaterally in her lower abdomen and does not radiate. She denies aggravating or alleviating factors. She denies vaginal bleeding, leaking of fluid, decreased fetal movement, fever, falls, or recent illness.    Patient as prescribed vaginal Terazol cream but has not been using it.   OB History    Gravida  4   Para  3   Term  0   Preterm  3   AB  0   Living  2     SAB  0   TAB  0   Ectopic  0   Multiple  0   Live Births  2           Past Medical History:  Diagnosis Date  . Chlamydia Feb 2017  . H/O varicella   . Headache in pregnancy, antepartum, second trimester 06/25/2016  . Premature dilatation of cervix during pregnancy 10/01/2011   5/90%/-1  . Preterm delivery 12/04/2011   SVD at [redacted]w[redacted]d on 10/22/11    Past Surgical History:  Procedure Laterality Date  . CERVICAL CERCLAGE N/A 08/09/2016   Procedure: CERCLAGE CERVICAL;  Surgeon: Catalina Antigua, MD;  Location: [facility];  Service: Gynecology;  Laterality: N/A;  . CERVICAL CERCLAGE N/A 11/25/2017   Procedure: CERCLAGE CERVICAL;  Surgeon: [name], MD;  Location: [facility] BIRTHING SUITES;  Service: Gynecology;  Laterality: N/A;  . CESAREAN SECTION N/A 11/20/2016   Procedure: CESAREAN SECTION;  Surgeon: Willodean Rosenthal, MD;  Location: [facility] BIRTHING SUITES;  Service: Obstetrics;  Laterality: N/A;    Family History  Problem Relation Age of Onset  . Hypertension Mother   . Diabetes Brother   . Cancer Maternal Grandmother    LUNG  . Diabetes Maternal Grandfather   . Arthritis Paternal Grandmother   . Cancer Paternal Grandmother        BREAST  . Diabetes Maternal Uncle     Social History   Tobacco Use  . Smoking status: Never Smoker  . Smokeless tobacco: Never Used  Substance Use Topics  . Alcohol use: No  . Drug use: No    Allergies: No Known Allergies  Facility-Administered Medications Prior to Admission  Medication Dose Route Frequency Provider Last Rate Last Dose  . hydroxyprogesterone caproate (MAKENA) 250 mg/mL injection 250 mg  250 mg Intramuscular Weekly Conan Bowens, MD   250 mg at 04/07/18 1538   Medications Prior to Admission  Medication Sig Dispense Refill Last Dose  . acetaminophen (TYLENOL) 500 MG tablet Take 500 mg by mouth every 6 (six) hours as needed for headache.    Taking  . cyclobenzaprine (FLEXERIL) 10 MG tablet Take 1 tablet (10 mg total) by mouth every 8 (eight) hours as needed for muscle spasms. 30 tablet 1 Taking  . Misc. Devices MISC Dispense one maternity belt for patient (Patient not taking: Reported on 04/07/2018) 1 each 0 Not Taking  . Prenatal Vit-Fe Fumarate-FA (PRENATAL MULTIVITAMIN) TABS tablet Take 1 tablet by mouth daily at 12 noon.  90 tablet 6 Taking    Review of Systems  Constitutional: Negative for chills, fatigue and fever.  Gastrointestinal: Positive for abdominal pain. Negative for nausea and vomiting.  Genitourinary: Negative for vaginal bleeding, vaginal discharge and vaginal pain.  Musculoskeletal: Negative for back pain.  Neurological: Negative for headaches.  All other systems reviewed and are negative.  Physical Exam   Blood pressure 115/74, pulse (!) 104, temperature 98.3 F (36.8 C), temperature source Oral, resp. rate 16, weight 61.7 kg, last menstrual period 08/05/2017, not currently breastfeeding.  Physical Exam  Nursing note and vitals reviewed. Constitutional: She is oriented to person, place, and time. She appears well-developed and  well-nourished.  Cardiovascular: Normal rate.  Respiratory: Effort normal.  GI: She exhibits no distension. There is no abdominal tenderness. There is no rebound, no guarding and no CVA tenderness.  Gravid  Genitourinary: Cervix exhibits discharge.    Genitourinary Comments: Thick white vaginal discharge   Neurological: She is alert and oriented to person, place, and time.  Skin: Skin is warm and dry.  Psychiatric: She has a normal mood and affect. Her behavior is normal. Judgment and thought content normal.   Cerclage visualized, no bleeding, cervix closed/thick  MAU Course/MDM  Procedures: Sterile Speculum Exam  --HPI reviewed with Dr. Emelda Fear --Cerclage removed in MAU by Dr. Emelda Fear. Tolerated well by patient  --Sterile gauze removed at 8:15pm, no new bleeding observed prior to discharge --Reactive tracing: baseline 140, moderate variability, positive accelerations, no decelerations --Toco: irregular mild contractions q 2-6 minutes. Not consistently felt by patient --Contractions responding well to IV fluid hydration --Cervix closed/thick/-2 prior to discharge  Results for orders placed or performed during the hospital encounter of 04/09/18 (from the past 24 hour(s))  Wet prep, genital     Status: Abnormal   Collection Time: 04/09/18  6:29 PM  Result Value Ref Range   Yeast Wet Prep HPF POC NONE SEEN NONE SEEN   Trich, Wet Prep NONE SEEN NONE SEEN   Clue Cells Wet Prep HPF POC NONE SEEN NONE SEEN   WBC, Wet Prep HPF POC MANY (A) NONE SEEN   Sperm NONE SEEN      Assessment and Plan  --28 y.o. [id number] at [redacted]w[redacted]d  --Reactive tracing --Cerclage removed --TOLAC consent signed by Dr. Emelda Fear --Discharge home in stable condition with labor precautions  F/U: [facility] 04/14/2018  Calvert Cantor, CNM 04/09/2018, 9:31 PM

## 2018-04-09 NOTE — MAU Note (Signed)
Pt C/O increasing pelvic pressure, hurts to change positions or cross legs, feels like she needs to push but has cerclage. Also feels like she is having contractions.  Denies bleeding or LOF.  Reports good fetal movement.

## 2018-04-09 NOTE — Discharge Instructions (Signed)

## 2018-04-11 LAB — CULTURE, BETA STREP (GROUP B ONLY): Strep Gp B Culture: NEGATIVE

## 2018-04-12 LAB — GC/CHLAMYDIA PROBE AMP (~~LOC~~) NOT AT ARMC
Chlamydia: NEGATIVE
NEISSERIA GONORRHEA: NEGATIVE

## 2018-04-14 ENCOUNTER — Encounter (HOSPITAL_COMMUNITY): Payer: Self-pay

## 2018-04-14 ENCOUNTER — Ambulatory Visit (INDEPENDENT_AMBULATORY_CARE_PROVIDER_SITE_OTHER): Payer: Medicaid Other | Admitting: Obstetrics & Gynecology

## 2018-04-14 VITALS — BP 117/77 | HR 101 | Wt 133.0 lb

## 2018-04-14 DIAGNOSIS — O0993 Supervision of high risk pregnancy, unspecified, third trimester: Secondary | ICD-10-CM

## 2018-04-14 DIAGNOSIS — O09219 Supervision of pregnancy with history of pre-term labor, unspecified trimester: Principal | ICD-10-CM

## 2018-04-14 DIAGNOSIS — O0992 Supervision of high risk pregnancy, unspecified, second trimester: Secondary | ICD-10-CM

## 2018-04-14 DIAGNOSIS — O09213 Supervision of pregnancy with history of pre-term labor, third trimester: Secondary | ICD-10-CM

## 2018-04-14 DIAGNOSIS — Z3A36 36 weeks gestation of pregnancy: Secondary | ICD-10-CM

## 2018-04-14 DIAGNOSIS — O09899 Supervision of other high risk pregnancies, unspecified trimester: Secondary | ICD-10-CM

## 2018-04-14 NOTE — Progress Notes (Signed)
   PRENATAL VISIT NOTE  Subjective:  Sherri Shepherd is a 28 y.o. [phone] at [redacted]w[redacted]d being seen today for ongoing prenatal care.  She is currently monitored for the following issues for this high-risk pregnancy and has History of preterm delivery, currently pregnant; Status post primary low transverse cesarean section; Cervical cerclage suture present, antepartum; Supervision of high risk pregnancy, antepartum, second trimester; and History of cervical incompetence on their problem list.  Patient reports no complaints.  Contractions: Irregular. Vag. Bleeding: None.  Movement: Present. Denies leaking of fluid.   The following portions of the patient's history were reviewed and updated as appropriate: allergies, current medications, past family history, past medical history, past social history, past surgical history and problem list. Problem list updated.  Objective:   Vitals:   04/14/18 1454  BP: 117/77  Pulse: (!) 101  Weight: 133 lb (60.3 kg)    Fetal Status: Fetal Heart Rate (bpm): 142   Movement: Present     General:  Alert, oriented and cooperative. Patient is in no acute distress.  Skin: Skin is warm and dry. No rash noted.   Cardiovascular: Normal heart rate noted  Respiratory: Normal respiratory effort, no problems with respiration noted  Abdomen: Soft, gravid, appropriate for gestational age.  Pain/Pressure: Present     Pelvic: Cervical exam performed        Extremities: Normal range of motion.  Edema: None  Mental Status: Normal mood and affect. Normal behavior. Normal judgment and thought content.   Assessment and Plan:  Pregnancy: [id number] at [redacted]w[redacted]d She has decided that she would like a TOLAC I sent Rhea Bleacher a message to cancel her c/s.  There are no diagnoses linked to this encounter. Term labor symptoms and general obstetric precautions including but not limited to vaginal bleeding, contractions, leaking of fluid and fetal movement were reviewed in detail with the  patient. Please refer to After Visit Summary for other counseling recommendations.  No follow-ups on file.  Future Appointments  Date Time Provider Department Center  04/28/2018  8:30 AM [facility] PAT 1 [facility] None    Allie Bossier, MD

## 2018-04-15 ENCOUNTER — Inpatient Hospital Stay (HOSPITAL_COMMUNITY)
Admission: AD | Admit: 2018-04-15 | Discharge: 2018-04-15 | Disposition: A | Discharge status: Home / Self Care | Payer: Medicaid Other | Attending: Family Medicine | Admitting: Family Medicine

## 2018-04-15 ENCOUNTER — Encounter (HOSPITAL_COMMUNITY): Payer: Self-pay | Admitting: *Deleted

## 2018-04-15 ENCOUNTER — Ambulatory Visit (INDEPENDENT_AMBULATORY_CARE_PROVIDER_SITE_OTHER): Payer: Medicaid Other | Admitting: Obstetrics & Gynecology

## 2018-04-15 VITALS — BP 123/81 | HR 114 | Wt 133.0 lb

## 2018-04-15 DIAGNOSIS — Z3A37 37 weeks gestation of pregnancy: Secondary | ICD-10-CM | POA: Diagnosis not present

## 2018-04-15 DIAGNOSIS — O0992 Supervision of high risk pregnancy, unspecified, second trimester: Secondary | ICD-10-CM

## 2018-04-15 DIAGNOSIS — O471 False labor at or after 37 completed weeks of gestation: Secondary | ICD-10-CM | POA: Insufficient documentation

## 2018-04-15 DIAGNOSIS — O479 False labor, unspecified: Secondary | ICD-10-CM

## 2018-04-15 DIAGNOSIS — Z3A39 39 weeks gestation of pregnancy: Secondary | ICD-10-CM | POA: Diagnosis not present

## 2018-04-15 DIAGNOSIS — O039 Complete or unspecified spontaneous abortion without complication: Secondary | ICD-10-CM | POA: Diagnosis not present

## 2018-04-15 NOTE — MAU Provider Note (Signed)
None      S: Ms. ELIZET SOLLITTO is a 28 y.o. [phone] at [redacted]w[redacted]d  who presents to MAU today complaining contractions q 6 minutes since this morning. She denies vaginal bleeding. She denies LOF. She reports normal fetal movement.    O: BP 117/76   Pulse 96   Temp 98 F (36.7 C) (Oral)   Resp 16   LMP 08/05/2017 (Exact Date)  GENERAL: Well-developed, well-nourished female in no acute distress.  HEAD: Normocephalic, atraumatic.  CHEST: Normal effort of breathing, regular heart rate ABDOMEN: Soft, nontender, gravid  Cervical exam:  Dilation: 3.5 Effacement (%): 70 Cervical Position: Posterior Station: -2 Presentation: Vertex Exam by:: Dorrene German RN   Fetal Monitoring: Baseline: 135 Variability: mod Accelerations: present Decelerations: nonr Contractions: q 6   A: SIUP at 107w0d  False labor  P: DIscharge home with labor precautions   Marylene Land, CNM 04/15/2018 9:43 AM

## 2018-04-15 NOTE — Progress Notes (Signed)
She is here because she would like her cervix checked again. She was disappointed because she was not admitted yesterday when she was seen at the MAU for back pain. I have given her reassurance that she clearly was NOT in labor yesterday. I have stressed realistic expectations about admission for labor.

## 2018-04-15 NOTE — MAU Note (Signed)
Pt came in by EMS, uc's since 0430, denies bleeding or LOF.  Was 2 cm's yesterday.  Reports good fetal movement.

## 2018-04-15 NOTE — Discharge Instructions (Signed)

## 2018-04-17 ENCOUNTER — Inpatient Hospital Stay (HOSPITAL_COMMUNITY): Payer: Medicaid Other | Admitting: Anesthesiology

## 2018-04-17 ENCOUNTER — Inpatient Hospital Stay (HOSPITAL_COMMUNITY)
Admission: AD | Admit: 2018-04-17 | Discharge: 2018-04-19 | DRG: 807 | Disposition: A | Discharge status: Home / Self Care | Payer: Medicaid Other | Attending: Obstetrics & Gynecology | Admitting: Obstetrics & Gynecology

## 2018-04-17 ENCOUNTER — Other Ambulatory Visit: Payer: Self-pay

## 2018-04-17 ENCOUNTER — Encounter (HOSPITAL_COMMUNITY): Payer: Self-pay | Admitting: *Deleted

## 2018-04-17 DIAGNOSIS — O34211 Maternal care for low transverse scar from previous cesarean delivery: Secondary | ICD-10-CM | POA: Diagnosis present

## 2018-04-17 DIAGNOSIS — O0992 Supervision of high risk pregnancy, unspecified, second trimester: Secondary | ICD-10-CM

## 2018-04-17 DIAGNOSIS — O9902 Anemia complicating childbirth: Secondary | ICD-10-CM | POA: Diagnosis present

## 2018-04-17 DIAGNOSIS — O3433 Maternal care for cervical incompetence, third trimester: Principal | ICD-10-CM | POA: Diagnosis present

## 2018-04-17 DIAGNOSIS — O4290 Premature rupture of membranes, unspecified as to length of time between rupture and onset of labor, unspecified weeks of gestation: Secondary | ICD-10-CM | POA: Diagnosis present

## 2018-04-17 DIAGNOSIS — D649 Anemia, unspecified: Secondary | ICD-10-CM | POA: Diagnosis present

## 2018-04-17 DIAGNOSIS — O26893 Other specified pregnancy related conditions, third trimester: Secondary | ICD-10-CM | POA: Diagnosis present

## 2018-04-17 DIAGNOSIS — Z3A37 37 weeks gestation of pregnancy: Secondary | ICD-10-CM | POA: Diagnosis not present

## 2018-04-17 DIAGNOSIS — O4292 Full-term premature rupture of membranes, unspecified as to length of time between rupture and onset of labor: Secondary | ICD-10-CM | POA: Diagnosis present

## 2018-04-17 DIAGNOSIS — O479 False labor, unspecified: Secondary | ICD-10-CM | POA: Diagnosis not present

## 2018-04-17 LAB — TYPE AND SCREEN
ABO/RH(D): O POS
Antibody Screen: NEGATIVE

## 2018-04-17 LAB — CBC
HCT: 30.8 % — ABNORMAL LOW (ref 36.0–46.0)
Hemoglobin: 9.4 g/dL — ABNORMAL LOW (ref 12.0–15.0)
MCH: 26.7 pg (ref 26.0–34.0)
MCHC: 30.5 g/dL (ref 30.0–36.0)
MCV: 87.5 fL (ref 80.0–100.0)
Platelets: 285 10*3/uL (ref 150–400)
RBC: 3.52 MIL/uL — ABNORMAL LOW (ref 3.87–5.11)
RDW: 16.1 % — ABNORMAL HIGH (ref 11.5–15.5)
WBC: 11.8 10*3/uL — ABNORMAL HIGH (ref 4.0–10.5)
nRBC: 0 % (ref 0.0–0.2)

## 2018-04-17 LAB — POCT FERN TEST: POCT Fern Test: POSITIVE — AB

## 2018-04-17 MED ORDER — SOD CITRATE-CITRIC ACID 500-334 MG/5ML PO SOLN
30.0000 mL | ORAL | Status: DC | PRN
  Filled 2018-04-17: qty 30

## 2018-04-17 MED ORDER — EPHEDRINE 5 MG/ML INJ
10.0000 mg | INTRAVENOUS | Status: DC | PRN
  Filled 2018-04-17: qty 2

## 2018-04-17 MED ORDER — LACTATED RINGERS IV SOLN
INTRAVENOUS | Status: DC
  Administered 2018-04-17 (×2): via INTRAVENOUS

## 2018-04-17 MED ORDER — ONDANSETRON HCL 4 MG/2ML IJ SOLN: 4.0000 mg | Freq: Four times a day (QID) | INTRAMUSCULAR | Status: DC | PRN

## 2018-04-17 MED ORDER — OXYTOCIN BOLUS FROM INFUSION
500.0000 mL | Freq: Once | INTRAVENOUS | Status: AC
  Administered 2018-04-17: 500 mL via INTRAVENOUS

## 2018-04-17 MED ORDER — PHENYLEPHRINE 40 MCG/ML (10ML) SYRINGE FOR IV PUSH (FOR BLOOD PRESSURE SUPPORT)
80.0000 ug | PREFILLED_SYRINGE | INTRAVENOUS | Status: DC | PRN
  Filled 2018-04-17 (×2): qty 10

## 2018-04-17 MED ORDER — LIDOCAINE HCL (PF) 1 % IJ SOLN
30.0000 mL | INTRAMUSCULAR | Status: DC | PRN
  Administered 2018-04-17: 30 mL via SUBCUTANEOUS
  Filled 2018-04-17: qty 30

## 2018-04-17 MED ORDER — BENZOCAINE-MENTHOL 20-0.5 % EX AERO
1.0000 "application " | INHALATION_SPRAY | CUTANEOUS | Status: DC | PRN
  Administered 2018-04-17: 1 via TOPICAL
  Filled 2018-04-17: qty 56

## 2018-04-17 MED ORDER — LIDOCAINE HCL (PF) 1 % IJ SOLN
INTRAMUSCULAR | Status: DC | PRN
  Administered 2018-04-17: 2 mL via EPIDURAL
  Administered 2018-04-17: 3 mL via EPIDURAL
  Administered 2018-04-17: 5 mL via EPIDURAL

## 2018-04-17 MED ORDER — TERBUTALINE SULFATE 1 MG/ML IJ SOLN
0.2500 mg | Freq: Once | INTRAMUSCULAR | Status: DC | PRN
  Filled 2018-04-17: qty 1

## 2018-04-17 MED ORDER — ACETAMINOPHEN 325 MG PO TABS: 650.0000 mg | ORAL_TABLET | ORAL | Status: DC | PRN

## 2018-04-17 MED ORDER — IBUPROFEN 600 MG PO TABS
600.0000 mg | ORAL_TABLET | Freq: Four times a day (QID) | ORAL | Status: DC
  Administered 2018-04-17 – 2018-04-19 (×8): 600 mg via ORAL
  Filled 2018-04-17 (×8): qty 1

## 2018-04-17 MED ORDER — OXYTOCIN 40 UNITS IN NORMAL SALINE INFUSION - SIMPLE MED
1.0000 m[IU]/min | INTRAVENOUS | Status: DC
  Administered 2018-04-17: 2 m[IU]/min via INTRAVENOUS

## 2018-04-17 MED ORDER — FENTANYL CITRATE (PF) 100 MCG/2ML IJ SOLN
100.0000 ug | INTRAMUSCULAR | Status: DC | PRN
  Administered 2018-04-17: 100 ug via INTRAVENOUS
  Filled 2018-04-17: qty 2

## 2018-04-17 MED ORDER — LACTATED RINGERS IV SOLN
500.0000 mL | Freq: Once | INTRAVENOUS | Status: AC
  Administered 2018-04-17: 500 mL via INTRAVENOUS

## 2018-04-17 MED ORDER — OXYCODONE-ACETAMINOPHEN 5-325 MG PO TABS: 2.0000 | ORAL_TABLET | ORAL | Status: DC | PRN

## 2018-04-17 MED ORDER — TETANUS-DIPHTH-ACELL PERTUSSIS 5-2.5-18.5 LF-MCG/0.5 IM SUSP: 0.5000 mL | Freq: Once | INTRAMUSCULAR | Status: DC

## 2018-04-17 MED ORDER — OXYTOCIN 40 UNITS IN NORMAL SALINE INFUSION - SIMPLE MED
2.5000 [IU]/h | INTRAVENOUS | Status: DC
  Filled 2018-04-17: qty 1000

## 2018-04-17 MED ORDER — OXYCODONE-ACETAMINOPHEN 5-325 MG PO TABS: 1.0000 | ORAL_TABLET | ORAL | Status: DC | PRN

## 2018-04-17 MED ORDER — DIPHENHYDRAMINE HCL 50 MG/ML IJ SOLN: 12.5000 mg | INTRAMUSCULAR | Status: DC | PRN

## 2018-04-17 MED ORDER — PHENYLEPHRINE 40 MCG/ML (10ML) SYRINGE FOR IV PUSH (FOR BLOOD PRESSURE SUPPORT)
80.0000 ug | PREFILLED_SYRINGE | INTRAVENOUS | Status: DC | PRN
  Filled 2018-04-17: qty 10

## 2018-04-17 MED ORDER — DIBUCAINE 1 % RE OINT: 1.0000 "application " | TOPICAL_OINTMENT | RECTAL | Status: DC | PRN

## 2018-04-17 MED ORDER — ONDANSETRON HCL 4 MG/2ML IJ SOLN: 4.0000 mg | INTRAMUSCULAR | Status: DC | PRN

## 2018-04-17 MED ORDER — DIPHENHYDRAMINE HCL 25 MG PO CAPS: 25.0000 mg | ORAL_CAPSULE | Freq: Four times a day (QID) | ORAL | Status: DC | PRN

## 2018-04-17 MED ORDER — SIMETHICONE 80 MG PO CHEW: 80.0000 mg | CHEWABLE_TABLET | ORAL | Status: DC | PRN

## 2018-04-17 MED ORDER — FENTANYL-BUPIVACAINE-NACL 0.5-0.125-0.9 MG/250ML-% EP SOLN
12.0000 mL/h | EPIDURAL | Status: DC | PRN
  Filled 2018-04-17: qty 250

## 2018-04-17 MED ORDER — ACETAMINOPHEN 325 MG PO TABS
650.0000 mg | ORAL_TABLET | ORAL | Status: DC | PRN
  Administered 2018-04-18 (×2): 650 mg via ORAL
  Filled 2018-04-17 (×2): qty 2

## 2018-04-17 MED ORDER — COCONUT OIL OIL: 1.0000 "application " | TOPICAL_OIL | Status: DC | PRN

## 2018-04-17 MED ORDER — WITCH HAZEL-GLYCERIN EX PADS: 1.0000 "application " | MEDICATED_PAD | CUTANEOUS | Status: DC | PRN

## 2018-04-17 MED ORDER — LACTATED RINGERS IV SOLN: 500.0000 mL | INTRAVENOUS | Status: DC | PRN

## 2018-04-17 MED ORDER — SENNOSIDES-DOCUSATE SODIUM 8.6-50 MG PO TABS
2.0000 | ORAL_TABLET | ORAL | Status: DC
  Administered 2018-04-18: 2 via ORAL
  Filled 2018-04-17 (×2): qty 2

## 2018-04-17 MED ORDER — ONDANSETRON HCL 4 MG PO TABS: 4.0000 mg | ORAL_TABLET | ORAL | Status: DC | PRN

## 2018-04-17 NOTE — Anesthesia Preprocedure Evaluation (Signed)
Anesthesia Evaluation  Patient identified by MRN, date of birth, ID band Patient awake    Reviewed: Allergy & Precautions, Patient's Chart, lab work & pertinent test results  Airway Mallampati: II  TM Distance: >3 FB     Dental   Pulmonary neg pulmonary ROS,    breath sounds clear to auscultation       Cardiovascular negative cardio ROS   Rhythm:Regular Rate:Normal     Neuro/Psych  Headaches,    GI/Hepatic negative GI ROS, Neg liver ROS,   Endo/Other  negative endocrine ROS  Renal/GU negative Renal ROS     Musculoskeletal   Abdominal   Peds  Hematology  (+) anemia ,   Anesthesia Other Findings   Reproductive/Obstetrics (+) Pregnancy                             Lab Results  Component Value Date   WBC 11.8 (H) 04/17/2018   HGB 9.4 (L) 04/17/2018   HCT 30.8 (L) 04/17/2018   MCV 87.5 04/17/2018   PLT 285 04/17/2018    Anesthesia Physical Anesthesia Plan  ASA: II  Anesthesia Plan: Epidural   Post-op Pain Management:    Induction:   PONV Risk Score and Plan: Treatment may vary due to age or medical condition  Airway Management Planned: Natural Airway  Additional Equipment:   Intra-op Plan:   Post-operative Plan:   Informed Consent: I have reviewed the patients History and Physical, chart, labs and discussed the procedure including the risks, benefits and alternatives for the proposed anesthesia with the patient or authorized representative who has indicated his/her understanding and acceptance.       Plan Discussed with: CRNA  Anesthesia Plan Comments:         Anesthesia Quick Evaluation

## 2018-04-17 NOTE — Discharge Summary (Addendum)
Postpartum Discharge Summary     Patient Name: Sherri Shepherd DOB: 11-25-1990 MRN: [id number]  Date of admission: 04/17/2018 Delivering Provider: Rolm Bookbinder   Date of discharge: 04/19/2018  Admitting diagnosis: 37 wks water broke Intrauterine pregnancy: [redacted]w[redacted]d     Secondary diagnosis:  Active Problems:   Premature rupture of membranes    Discharge diagnosis: Term Pregnancy Delivered and VBAC                  Post partum procedures:None Augmentation: Pitocin Complications: None  Hospital course:  Onset of Labor With Vaginal Delivery     28 y.o. yo [phone] at [redacted]w[redacted]d was admitted in Latent Labor on 04/17/2018. Patient had an uncomplicated labor course as follows:  Membrane Rupture Time/Date: 4:34 AM ,04/17/2018   Intrapartum Procedures: Episiotomy: None [1]                                         Lacerations:  Labial [10]  Patient had a delivery of a Viable infant. 04/17/2018  Information for the patient's newborn:  Abella, Euceda [[id number]]      Patient had an uncomplicated postpartum course.  She is ambulating, tolerating a regular diet, passing flatus, and urinating well. Patient is discharged home in stable condition on 04/19/18.  Magnesium Sulfate recieved: No BMZ received: No  Physical exam  Vitals:   04/18/18 0335 04/18/18 1055 04/18/18 2203 04/19/18 0500  BP: 115/72 117/83 104/69 123/83  Pulse: 64 69 (!) 56 (!) 56  Resp: 18 19 18 16   Temp: 98.4 F (36.9 C) 98.7 F (37.1 C) 98.5 F (36.9 C) 97.8 F (36.6 C)  TempSrc: Oral Oral Oral Oral  SpO2:  99%     General: alert, cooperative and no distress Lochia: appropriate Uterine Fundus: firm Incision: N/A DVT Evaluation: no LE edema  Labs: Lab Results  Component Value Date   WBC 11.8 (H) 04/17/2018   HGB 9.4 (L) 04/17/2018   HCT 30.8 (L) 04/17/2018   MCV 87.5 04/17/2018   PLT 285 04/17/2018   CMP Latest Ref Rng & Units 09/08/2017  Glucose 70 - 99 mg/dL 84  BUN 6 - 20 mg/dL 10  Creatinine  6.22 - 1.00 mg/dL 6.33  Sodium 354 - 562 mmol/L 134(L)  Potassium 3.5 - 5.1 mmol/L 3.8  Chloride 98 - 111 mmol/L 103  CO2 22 - 32 mmol/L 23  Calcium 8.9 - 10.3 mg/dL 9.1  Total Protein 6.5 - 8.1 g/dL 7.2  Total Bilirubin 0.3 - 1.2 mg/dL 0.8  Alkaline Phos 38 - 126 U/L 98  AST 15 - 41 U/L 18  ALT 0 - 44 U/L 15    Discharge instruction: per After Visit Summary and "Baby and Me Booklet".  After visit meds:  Allergies as of 04/19/2018   No Known Allergies     Medication List    STOP taking these medications   acetaminophen 500 MG tablet Commonly known as:  TYLENOL   Misc. Devices Misc     TAKE these medications   ibuprofen 600 MG tablet Commonly known as:  ADVIL,MOTRIN Take 1 tablet (600 mg total) by mouth every 6 (six) hours.   prenatal multivitamin Tabs tablet Take 1 tablet by mouth daily at 12 noon.   senna-docusate 8.6-50 MG tablet Commonly known as:  Senokot-S Take 2 tablets by mouth daily. Start taking on:  April 20, 2018  Diet: routine diet Activity: Advance as tolerated. Pelvic rest for 6 weeks.   Outpatient follow up:4 weeks Follow up Appt: Future Appointments  Date Time Provider Department Center  04/26/2018  8:35 AM Armando Reichert, CNM [facility]  04/28/2018  8:30 AM [facility] PAT 1 [facility] None   Follow up Visit: Follow-up Information    [facility]             . Schedule an appointment as soon as possible for a visit in 4 week(s).   Why:  for postpartum visit Contact information: [place]         Please schedule this patient for Postpartum visit in: 4 weeks with the following provider: Any provider For C/S patients schedule nurse incision check in weeks 2 weeks: no Low risk pregnancy complicated by: None Delivery mode:  SVD Anticipated Birth Control:  Nexplanon PP Procedures needed: None  Schedule Integrated BH visit: no  Newborn Data: Live born female  Birth Weight:   APGAR: 8, 9  Newborn Delivery   Birth  date/time:  04/17/2018 13:18:00 Delivery type:  VBAC, Spontaneous    Baby Feeding: Bottle and Breast Disposition:home with mother  04/19/2018 Joana Reamer, DO  Attestation: I have seen this patient and agree with the resident's documentation. I have examined them separately, and we have discussed the plan of care.  Cristal Deer. Earlene Plater, DO OB/GYN Fellow

## 2018-04-17 NOTE — H&P (Signed)
OBSTETRIC ADMISSION HISTORY AND PHYSICAL  Sherri Shepherd is a 28 y.o. female [phone] with IUP at [redacted]w[redacted]d by 6 week [place] presenting for rupture of membranes at 0440.   Reports fetal movement. Denies vaginal bleeding.  She received her prenatal care at [facility].  Support person in labor: partner Sherri Shepherd (father of baby)  Ultrasounds . Anatomy U/S: normal  Prenatal History/Complications: . History of preterm delivery, on makena . History of LTCS for breech . History of cervical incompetence with cervical cerclage this pregnancy  Past Medical History: Past Medical History:  Diagnosis Date  . Chlamydia Feb 2017  . H/O varicella   . Headache in pregnancy, antepartum, second trimester 06/25/2016  . Premature dilatation of cervix during pregnancy 10/01/2011   5/90%/-1  . Preterm delivery 12/04/2011   SVD at [redacted]w[redacted]d on 10/22/11    Past Surgical History: Past Surgical History:  Procedure Laterality Date  . CERVICAL CERCLAGE N/A 08/09/2016   Procedure: CERCLAGE CERVICAL;  Surgeon: Sherri Antigua, MD;  Location: [facility] ORS;  Service: Gynecology;  Laterality: N/A;  . CERVICAL CERCLAGE N/A 11/25/2017   Procedure: CERCLAGE CERVICAL;  Surgeon: Sherri Bores, MD;  Location: [facility] BIRTHING SUITES;  Service: Gynecology;  Laterality: N/A;  . CESAREAN SECTION N/A 11/20/2016   Procedure: CESAREAN SECTION;  Surgeon: Sherri Rosenthal, MD;  Location: [facility] BIRTHING SUITES;  Service: Obstetrics;  Laterality: N/A;    Obstetrical History: OB History    Gravida  4   Para  3   Term  0   Preterm  3   AB  0   Living  2     SAB  0   TAB  0   Ectopic  0   Multiple  0   Live Births  2           Social History: Social History   Socioeconomic History  . Marital status: Single    Spouse name: Not on file  . Number of children: Not on file  . Years of education: 29  . Highest education level: Not on file  Occupational History  . Occupation: HOMEMAKER  Social Needs  . Financial resource  strain: Not hard at all  . Food insecurity:    Worry: Never true    Inability: Never true  . Transportation needs:    Medical: Yes    Non-medical: No  Tobacco Use  . Smoking status: Never Smoker  . Smokeless tobacco: Never Used  Substance and Sexual Activity  . Alcohol use: No  . Drug use: No  . Sexual activity: Yes    Partners: Male    Birth control/protection: None  Lifestyle  . Physical activity:    Days per week: Not on file    Minutes per session: Not on file  . Stress: Not at all  Relationships  . Social connections:    Talks on phone: Not on file    Gets together: Not on file    Attends religious service: Not on file    Active member of club or organization: Not on file    Attends meetings of clubs or organizations: Not on file    Relationship status: Not on file  Other Topics Concern  . Not on file  Social History Narrative  . Not on file    Family History: Family History  Problem Relation Age of Onset  . Hypertension Mother   . Diabetes Brother   . Cancer Maternal Grandmother        LUNG  .  Diabetes Maternal Grandfather   . Arthritis Paternal Grandmother   . Cancer Paternal Grandmother        BREAST  . Diabetes Maternal Uncle     Allergies: No Known Allergies  Medications Prior to Admission  Medication Sig Dispense Refill Last Dose  . acetaminophen (TYLENOL) 500 MG tablet Take 500 mg by mouth every 6 (six) hours as needed for headache.    04/16/2018 at Unknown time  . Prenatal Vit-Fe Fumarate-FA (PRENATAL MULTIVITAMIN) TABS tablet Take 1 tablet by mouth daily at 12 noon. 90 tablet 6 04/16/2018 at Unknown time  . Misc. Devices MISC Dispense one maternity belt for patient (Patient not taking: Reported on 04/07/2018) 1 each 0 Not Taking     Review of Systems  All systems reviewed and negative except as stated in HPI  Blood pressure 121/77, pulse (!) 105, temperature 97.9 F (36.6 C), resp. rate 18, last menstrual period 08/05/2017, not currently  breastfeeding. General appearance: alert, appears stated age and no distress Lungs: no respiratory distress Heart: regular rate  Abdomen: soft, non-tender; gravid b Pelvic: deferred Extremities: Homans sign is negative, no sign of DVT Presentation: cephalic Fetal monitoring: 150 bpm/mod variablity/+accels/no decels Uterine activity: contracting q10 Dilation: 1 Effacement (%): 70  Prenatal labs: ABO, Rh: --/--/O POS (10/02 1044) Antibody: NEG (10/02 1044) Rubella: 1.89 (09/16 1201) RPR: Non Reactive (12/17 0912)  HBsAg: Negative (09/16 1201)  HIV: Non Reactive (12/17 0912)  GBS:   negative Glucola: normal Genetic screening:  Low risk NIPS, negative AFP  Prenatal Transfer Tool  Maternal Diabetes: No Genetic Screening: Normal Maternal Ultrasounds/Referrals: Normal Fetal Ultrasounds or other Referrals:  None Maternal Substance Abuse:  No Significant Maternal Medications:  Meds include: Other: Makena Significant Maternal Lab Results: None  Results for orders placed or performed during the hospital encounter of 04/17/18 (from the past 24 hour(s))  POCT fern test   Collection Time: 04/17/18  6:13 AM  Result Value Ref Range   POCT Fern Test Positive = ruptured amniotic membanes (A)     Patient Active Problem List   Diagnosis Date Noted  . Supervision of high risk pregnancy, antepartum, second trimester 11/09/2017  . History of cervical incompetence 11/09/2017  . Cervical cerclage suture present, antepartum 09/30/2017  . Status post primary low transverse cesarean section 11/21/2016  . History of preterm delivery, currently pregnant 03/13/2015    Assessment/Plan:  Sherri Shepherd is a 28 y.o. [phone] at [redacted]w[redacted]d here for SOL.  Labor: in early labor, SROM at 0445. Will monitor for several hours, consider augmentation PRN. Cerclage removed 2/14. TOLAC success calculated at 84%; has had prior NSVD, last cesarean for breech. -- pain control: planning for epidural  Fetal  Wellbeing: EFW 6 lb by Leopold's. Cephalic by [place].  -- GBS (negative) -- continuous fetal monitoring   Postpartum Planning -- both/nexplanon -- RI/[x] Tdap   Aura Camps, MD OB/GYN Fellow

## 2018-04-17 NOTE — Lactation Note (Signed)
This note was copied from a baby's chart. Lactation Consultation Note  Patient Name: Sherri Shepherd CWCBJ'S Date: 04/17/2018 Reason for consult: Initial assessment;1st time breastfeeding;Infant < 6lbs;Early term 38-38.6wks  Visited with P4 Mom of ET infant weighing 5 lbs 14.2 oz.  Baby 3 hrs old.  Mom states that she tried to breastfeed her 2nd baby, but he wouldn't "take to it".  Mom choosing to breast and formula feed baby.   Baby has breastfed once after birth, and has had one formula feeding of 15 ml.  Baby sleeping in the crib, and Mom is eating her meal.    Encouraged keeping baby STS as much as possible, offering the breast with any cues, prior to offering formula. Offered assistance as needed with latching.    Started talking about pumping, but Mom appeared more interested in her food.  Will follow-up tomorrow.    Lactation brochure left with Mom.  Reviewed IP and OP lactation support available to her.     Maternal Data Formula Feeding for Exclusion: Yes Reason for exclusion: Mother's choice to formula and breast feed on admission Has patient been taught Hand Expression?: Yes Does the patient have breastfeeding experience prior to this delivery?: Yes  Feeding Feeding Type: Bottle Fed - Formula Nipple Type: Slow - flow  LATCH Score Latch: Grasps breast easily, tongue down, lips flanged, rhythmical sucking.  Audible Swallowing: A few with stimulation  Type of Nipple: Everted at rest and after stimulation  Comfort (Breast/Nipple): Soft / non-tender  Hold (Positioning): No assistance needed to correctly position infant at breast.  LATCH Score: 9  Interventions Interventions: Breast feeding basics reviewed;Skin to skin;Breast massage;Hand express  Lactation Tools Discussed/Used WIC Program: Yes   Consult Status Consult Status: Follow-up Date: 04/18/18 Follow-up type: In-patient    Judee Clara 04/17/2018, 4:59 PM

## 2018-04-17 NOTE — Anesthesia Procedure Notes (Signed)
Epidural Patient location during procedure: OB Start time: 04/17/2018 12:40 PM End time: 04/17/2018 12:44 PM  Staffing Anesthesiologist: Marcene Duos, MD Performed: anesthesiologist   Preanesthetic Checklist Completed: patient identified, site marked, surgical consent, pre-op evaluation, timeout performed, IV checked, risks and benefits discussed and monitors and equipment checked  Epidural Patient position: sitting Prep: site prepped and draped and DuraPrep Patient monitoring: continuous pulse ox and blood pressure Approach: midline Location: L4-L5 Injection technique: LOR air  Needle:  Needle type: Tuohy  Needle gauge: 17 G Needle length: 9 cm and 9 Needle insertion depth: 5 cm cm Catheter type: closed end flexible Catheter size: 19 Gauge Catheter at skin depth: 10 cm Test dose: negative  Assessment Events: blood not aspirated, injection not painful, no injection resistance, negative IV test and no paresthesia

## 2018-04-17 NOTE — Anesthesia Postprocedure Evaluation (Addendum)
Anesthesia Post Note  Patient: Lenell Antu  Procedure(s) Performed: AN AD HOC LABOR EPIDURAL     Patient location during evaluation: Mother Baby Anesthesia Type: Epidural Level of consciousness: awake, awake and alert and oriented Pain management: pain level controlled Vital Signs Assessment: post-procedure vital signs reviewed and stable Respiratory status: spontaneous breathing, nonlabored ventilation and respiratory function stable Cardiovascular status: stable Postop Assessment: no headache, no backache, patient able to bend at knees, no apparent nausea or vomiting, adequate PO intake and able to ambulate Anesthetic complications: no    Last Vitals:  Vitals:   04/17/18 1510 04/17/18 1619  BP: 109/69 119/75  Pulse: 89 72  Resp: 20 20  Temp: 36.4 C 36.7 C  SpO2:      Last Pain:  Vitals:   04/17/18 1619  TempSrc: Oral  PainSc:    Pain Goal:                   Janiyah Beery

## 2018-04-17 NOTE — Anesthesia Pain Management Evaluation Note (Signed)
  CRNA Pain Management Visit Note  Patient: Sherri Shepherd, 28 y.o., female  "Hello I am a member of the anesthesia team at [facility]. We have an anesthesia team available at all times to provide care throughout the hospital, including epidural management and anesthesia for C-section. I don't know your plan for the delivery whether it a natural birth, water birth, IV sedation, nitrous supplementation, doula or epidural, but we want to meet your pain goals."   1.Was your pain managed to your expectations on prior hospitalizations?   Yes   2.What is your expectation for pain management during this hospitalization? IV pain meds      3.How can we help you reach that goal? *support**  Record the patient's initial score and the patient's pain goal.   Pain: 1  Pain Goal: 6 [facility] wants you to be able to say your pain was always managed very well.  Trellis Paganini 04/17/2018

## 2018-04-17 NOTE — Progress Notes (Signed)
Dr Earlene Plater aware of pt's admission and status. Aware of ? SROM and desire for TOLAC.

## 2018-04-17 NOTE — MAU Provider Note (Signed)
S: [name] is a 28 y.o. [phone] at [redacted]w[redacted]d  who presents to MAU today complaining of leaking of fluid since 0445 this AM. Had a big gush of clear fluid with subsequent slow leaking of fluid. She denies vaginal bleeding. She endorses contractions, they are irregular and not particularly bothersome. She reports normal fetal movement.    O: BP 121/77 (BP Location: Right Arm)   Pulse (!) 105   Temp 97.9 F (36.6 C)   Resp 18   LMP 08/05/2017 (Exact Date)  GENERAL: Well-developed, well-nourished female in no acute distress.  HEAD: Normocephalic, atraumatic.  CHEST: Normal effort of breathing, regular heart rate ABDOMEN: Soft, nontender, gravid PELVIC: Normal external female genitalia. Vagina is pink and rugated. Cervix with normal contour, no lesions. Normal discharge.  + pooling.   Cervical exam:  Dilation: 1 Effacement (%): 70  Vertex position.    Fetal Monitoring: Baseline: 145 Variability: moderate  Accelerations: + Decelerations: none  Contractions: Irregular with irritability   No results found for this or any previous visit (from the past 24 hour(s)).   A: SIUP at [redacted]w[redacted]d  SROM  Patient is a TOLAC. H/o 2 prior vaginal deliveries followed by CS for breech.  GBS neg.   P: Called L&D team, will assume care.  Admit to birthing suites.  Plan for expectant management for period of time given recent ROM; if cervix remains unchanged on repeat exam will discuss augmentation.    Arvilla Market, DO 04/17/2018 6:10 AM

## 2018-04-17 NOTE — Progress Notes (Signed)
Labor Progress Note Sherri Shepherd is a 28 y.o. [phone] at [redacted]w[redacted]d presented for SOL/SROM  S:  Patient comfortable, reporting irregular contractions  O:  BP 116/76   Pulse 80   Temp 98.8 F (37.1 C) (Oral)   Resp 18   LMP 08/05/2017 (Exact Date)   Fetal Tracing:  Baseline: 145 Variability: moderate Accels: 15x15 Decels: none  Toco: occasional uc's   CVE: Dilation: 4 Effacement (%): 90 Station: -1 Presentation: Vertex Exam by:: Lake Bells CNM   A&P: 28 y.o. [id number] [redacted]w[redacted]d SOL/SROM #Labor: Discrepancy from previous cervical exams and exam at admission. Cervix consisted with previous exams. Discussed with patient pitocin for augmentation of labor. Patient agreeable to plan of care. Will start pitocin 2x2. #Pain: epidural #FWB: Cat 1 #GBS negative  Rolm Bookbinder, CNM 9:43 AM

## 2018-04-17 NOTE — Progress Notes (Signed)
Epidural removed at patient request, catheter intact.

## 2018-04-17 NOTE — MAU Note (Addendum)
PT arrived by EMS to RM #4. Alert and oriented. SROM clear fld at 0440. No pain. 4cm last sve. TOLAC

## 2018-04-18 LAB — RPR: RPR Ser Ql: NONREACTIVE

## 2018-04-18 LAB — TYPE AND SCREEN
ABO/RH(D): O POS
Antibody Screen: NEGATIVE

## 2018-04-18 LAB — ABO/RH: ABO/RH(D): O POS

## 2018-04-18 NOTE — Progress Notes (Signed)
INitial visit with Sherri Shepherd and [name] to introduce spiritual care services and offer support as patient settles in after her transport from [facility].  Pt reports she is doing well.    Please page as further needs arise.  Maryanna Shape. Carley Hammed, M.Div. [facility] Chaplain Pager [phone] Office [phone]

## 2018-04-18 NOTE — Progress Notes (Signed)
Post Partum Day 1 Subjective: no complaints  Objective: Blood pressure 115/72, pulse 64, temperature 98.4 F (36.9 C), temperature source Oral, resp. rate 18, last menstrual period 08/05/2017, SpO2 99 %, unknown if currently breastfeeding.  Physical Exam:  General: alert, cooperative and no distress Lochia: appropriate Uterine Fundus: firm Incision: n/a DVT Evaluation: No evidence of DVT seen on physical exam.  Recent Labs    04/17/18 0633  HGB 9.4*  HCT 30.8*    Assessment/Plan: Contraception Nexplanon  She will be transported to [facility]   LOS: 1 day   Scheryl Darter 04/18/2018, 5:45 AM

## 2018-04-19 MED ORDER — IBUPROFEN 600 MG PO TABS: 600.0000 mg | ORAL_TABLET | Freq: Four times a day (QID) | ORAL | 0 refills | Status: DC

## 2018-04-19 MED ORDER — SENNOSIDES-DOCUSATE SODIUM 8.6-50 MG PO TABS: 2.0000 | ORAL_TABLET | ORAL | 0 refills | Status: DC

## 2018-04-19 NOTE — Lactation Note (Signed)
This note was copied from a baby's chart. Lactation Consultation Note  Patient Name: Sherri Shepherd JJHER'D Date: 04/19/2018   P3, Baby 44 hours old. [redacted]w[redacted]d < 6 lbs. Mother started giving mostly formula last night. Mother denies problems or questions.  Encouraged breast before formula if baby is interested. Feed on demand approximately 8-12 times per day at least q 3 hours.   Reviewed engorgement care and monitoring voids/stools.       Maternal Data    Feeding Feeding Type: Bottle Fed - Formula  LATCH Score                   Interventions    Lactation Tools Discussed/Used     Consult Status      Hardie Pulley 04/19/2018, 9:59 AM

## 2018-04-19 NOTE — Discharge Instructions (Signed)
Vaginal Delivery, Care After °Refer to this sheet in the next few weeks. These instructions provide you with information about caring for yourself after vaginal delivery. Your health care provider may also give you more specific instructions. Your treatment has been planned according to current medical practices, but problems sometimes occur. Call your health care provider if you have any problems or questions. °What can I expect after the procedure? °After vaginal delivery, it is common to have: °· Some bleeding from your vagina. °· Soreness in your abdomen, your vagina, and the area of skin between your vaginal opening and your anus (perineum). °· Pelvic cramps. °· Fatigue. °Follow these instructions at home: °Medicines °· Take over-the-counter and prescription medicines only as told by your health care provider. °· If you were prescribed an antibiotic medicine, take it as told by your health care provider. Do not stop taking the antibiotic until it is finished. °Driving ° °· Do not drive or operate heavy machinery while taking prescription pain medicine. °· Do not drive for 24 hours if you received a sedative. °Lifestyle °· Do not drink alcohol. This is especially important if you are breastfeeding or taking medicine to relieve pain. °· Do not use tobacco products, including cigarettes, chewing tobacco, or e-cigarettes. If you need help quitting, ask your health care provider. °Eating and drinking °· Drink at least 8 eight-ounce glasses of water every day unless you are told not to by your health care provider. If you choose to breastfeed your baby, you may need to drink more water than this. °· Eat high-fiber foods every day. These foods may help prevent or relieve constipation. High-fiber foods include: °? Whole grain cereals and breads. °? Brown rice. °? Beans. °? Fresh fruits and vegetables. °Activity °· Return to your normal activities as told by your health care provider. Ask your health care provider what  activities are safe for you. °· Rest as much as possible. Try to rest or take a nap when your baby is sleeping. °· Do not lift anything that is heavier than your baby or 10 lb (4.5 kg) until your health care provider says that it is safe. °· Talk with your health care provider about when you can engage in sexual activity. This may depend on your: °? Risk of infection. °? Rate of healing. °? Comfort and desire to engage in sexual activity. °Vaginal Care °· If you have an episiotomy or a vaginal tear, check the area every day for signs of infection. Check for: °? More redness, swelling, or pain. °? More fluid or blood. °? Warmth. °? Pus or a bad smell. °· Do not use tampons or douches until your health care provider says this is safe. °· Watch for any blood clots that may pass from your vagina. These may look like clumps of dark red, brown, or black discharge. °General instructions °· Keep your perineum clean and dry as told by your health care provider. °· Wear loose, comfortable clothing. °· Wipe from front to back when you use the toilet. °· Ask your health care provider if you can shower or take a bath. If you had an episiotomy or a perineal tear during labor and delivery, your health care provider may tell you not to take baths for a certain length of time. °· Wear a bra that supports your breasts and fits you well. °· If possible, have someone help you with household activities and help care for your baby for at least a few days after you   leave the hospital. °· Keep all follow-up visits for you and your baby as told by your health care provider. This is important. °Contact a health care provider if: °· You have: °? Vaginal discharge that has a bad smell. °? Difficulty urinating. °? Pain when urinating. °? A sudden increase or decrease in the frequency of your bowel movements. °? More redness, swelling, or pain around your episiotomy or vaginal tear. °? More fluid or blood coming from your episiotomy or vaginal  tear. °? Pus or a bad smell coming from your episiotomy or vaginal tear. °? A fever. °? A rash. °? Little or no interest in activities you used to enjoy. °? Questions about caring for yourself or your baby. °· Your episiotomy or vaginal tear feels warm to the touch. °· Your episiotomy or vaginal tear is separating or does not appear to be healing. °· Your breasts are painful, hard, or turn red. °· You feel unusually sad or worried. °· You feel nauseous or you vomit. °· You pass large blood clots from your vagina. If you pass a blood clot from your vagina, save it to show to your health care provider. Do not flush blood clots down the toilet without having your health care provider look at them. °· You urinate more than usual. °· You are dizzy or light-headed. °· You have not breastfed at all and you have not had a menstrual period for 12 weeks after delivery. °· You have stopped breastfeeding and you have not had a menstrual period for 12 weeks after you stopped breastfeeding. °Get help right away if: °· You have: °? Pain that does not go away or does not get better with medicine. °? Chest pain. °? Difficulty breathing. °? Blurred vision or spots in your vision. °? Thoughts about hurting yourself or your baby. °· You develop pain in your abdomen or in one of your legs. °· You develop a severe headache. °· You faint. °· You bleed from your vagina so much that you fill two sanitary pads in one hour. °This information is not intended to replace advice given to you by your health care provider. Make sure you discuss any questions you have with your health care provider. °Document Released: 02/08/2000 Document Revised: 07/25/2015 Document Reviewed: 02/25/2015 °Elsevier Interactive Patient Education © 2019 Elsevier Inc. ° °

## 2018-04-26 ENCOUNTER — Encounter: Payer: Self-pay | Admitting: Advanced Practice Midwife

## 2018-04-28 ENCOUNTER — Encounter (HOSPITAL_COMMUNITY)
Admission: RE | Admit: 2018-04-28 | Discharge: 2018-04-28 | Disposition: A | Discharge status: Home / Self Care | Payer: Medicaid Other | Source: Ambulatory Visit | Attending: Family Medicine | Admitting: Family Medicine

## 2018-04-29 ENCOUNTER — Encounter (HOSPITAL_COMMUNITY): Payer: Self-pay

## 2018-04-29 ENCOUNTER — Inpatient Hospital Stay (HOSPITAL_COMMUNITY): Admit: 2018-04-29 | Payer: Medicaid Other | Admitting: Obstetrics & Gynecology

## 2018-04-29 SURGERY — Surgical Case
Anesthesia: Choice

## 2018-05-27 ENCOUNTER — Encounter: Payer: Self-pay | Admitting: Obstetrics and Gynecology

## 2018-05-27 ENCOUNTER — Ambulatory Visit (INDEPENDENT_AMBULATORY_CARE_PROVIDER_SITE_OTHER): Payer: Self-pay | Admitting: Obstetrics and Gynecology

## 2018-05-27 ENCOUNTER — Other Ambulatory Visit: Payer: Self-pay

## 2018-05-27 VITALS — BP 121/88 | HR 78 | Wt 118.0 lb

## 2018-05-27 DIAGNOSIS — O0992 Supervision of high risk pregnancy, unspecified, second trimester: Secondary | ICD-10-CM

## 2018-05-27 DIAGNOSIS — Z30017 Encounter for initial prescription of implantable subdermal contraceptive: Secondary | ICD-10-CM

## 2018-05-27 DIAGNOSIS — Z3202 Encounter for pregnancy test, result negative: Secondary | ICD-10-CM

## 2018-05-27 MED ORDER — ETONOGESTREL 68 MG ~~LOC~~ IMPL
68.0000 mg | DRUG_IMPLANT | Freq: Once | SUBCUTANEOUS | Status: AC
  Administered 2018-05-27: 68 mg via SUBCUTANEOUS

## 2018-05-27 NOTE — Progress Notes (Signed)
Subjective:     Sherri Shepherd is a 28 y.o. female who presents for a postpartum visit. She is 5 weeks postpartum following a spontaneous vaginal delivery. I have fully reviewed the prenatal and intrapartum course. The delivery was at 37.2 gestational weeks. Outcome: spontaneous vaginal delivery. Anesthesia: epidural. Postpartum course has been unremarkable. Baby's course has been unremarkable. Baby is feeding by bottle - gerber gentel . Bleeding staining only. Bowel function is normal. Bladder function is normal. Patient is not sexually active. Contraception method is Nexplanon. Postpartum depression screening: negative.  The following portions of the patient's history were reviewed and updated as appropriate: allergies, current medications, past family history, past medical history, past social history, past surgical history and problem list.  Review of Systems Pertinent items are noted in HPI.   Objective:    BP 121/88   Pulse 78   Wt 118 lb (53.5 kg)   BMI 19.64 kg/m   General:  alert and cooperative  Lungs: clear to auscultation bilaterally  Heart:  regular rate and rhythm, S1, S2 normal, no murmur, click, rub or gallop  Abdomen: soft, non-tender; bowel sounds normal; no masses,  no organomegaly        Assessment:   Normal postpartum exam. Pap smear not done at today's visit.  Last pap was in 2018 and it was normal.   Plan:   1. Contraception: Nexplanon 2. Pregnany test negative today 3. Follow up in: As needed          GYNECOLOGY OFFICE PROCEDURE NOTE  Sherri Shepherd is a 28 y.o. [phone] here for Nexplanon insertion.  Last pap smear was in 2018 and was normal.  No other gynecologic concerns.  Nexplanon Insertion Procedure Patient identified, informed consent performed, consent signed.   Patient does understand that irregular bleeding is a very common side effect of this medication. She was advised to have backup contraception for one week after placement.  Pregnancy test in clinic today was negative.  Appropriate time out taken.  Patient's left arm was prepped and draped in the usual sterile fashion. The ruler used to measure and mark insertion area.  Patient was prepped with alcohol swab and then injected with 3 ml of 1% lidocaine.  She was prepped with betadine, Nexplanon removed from packaging,  Device confirmed in needle, then inserted full length of needle and withdrawn per handbook instructions. Nexplanon was able to palpated in the patient's arm; patient palpated the insert herself. There was minimal blood loss.  Patient insertion site covered with guaze and a pressure bandage to reduce any bruising.  The patient tolerated the procedure well and was given post procedure instructions.     Ebunoluwa Gernert, Harolyn Rutherford, NP [facility] [employer], [facility]

## 2018-06-01 ENCOUNTER — Encounter: Payer: Self-pay | Admitting: *Deleted

## 2018-09-24 ENCOUNTER — Other Ambulatory Visit: Payer: Self-pay | Admitting: Obstetrics & Gynecology

## 2018-09-24 DIAGNOSIS — M549 Dorsalgia, unspecified: Secondary | ICD-10-CM

## 2019-01-27 ENCOUNTER — Encounter: Payer: Self-pay | Admitting: *Deleted

## 2019-01-28 ENCOUNTER — Ambulatory Visit (HOSPITAL_COMMUNITY)
Admission: EM | Admit: 2019-01-28 | Discharge: 2019-01-28 | Disposition: A | Discharge status: Home / Self Care | Payer: Medicaid Other | Attending: Family Medicine | Admitting: Family Medicine

## 2019-01-28 ENCOUNTER — Other Ambulatory Visit: Payer: Self-pay

## 2019-01-28 ENCOUNTER — Encounter (HOSPITAL_COMMUNITY): Payer: Self-pay | Admitting: Family Medicine

## 2019-01-28 DIAGNOSIS — Z3202 Encounter for pregnancy test, result negative: Secondary | ICD-10-CM

## 2019-01-28 DIAGNOSIS — N939 Abnormal uterine and vaginal bleeding, unspecified: Secondary | ICD-10-CM

## 2019-01-28 LAB — POC URINE PREG, ED: Preg Test, Ur: NEGATIVE

## 2019-01-28 LAB — POCT PREGNANCY, URINE: Preg Test, Ur: NEGATIVE

## 2019-01-28 MED ORDER — MEGESTROL ACETATE 40 MG PO TABS: 40.0000 mg | ORAL_TABLET | Freq: Two times a day (BID) | ORAL | 0 refills | Status: DC

## 2019-01-28 NOTE — ED Triage Notes (Addendum)
Pt reports having a heavy vaginal bleeding x 5 days. Pt states having abdominal pain, back pain x 5 days. Pt reports having a bruise in her left arm where she had the Nexplanon  implant.

## 2019-01-28 NOTE — ED Provider Notes (Signed)
[facility]    CSN: [id number] Arrival date & time: 01/28/19  1112      History   Chief Complaint Chief Complaint  Patient presents with  . Vaginal Bleeding  . Abdominal Pain  . Back Pain    HPI Sherri Shepherd is a 28 y.o. female.   Initial [facility] patient visit  Patient having vaginal bleeding and pelvic cramping. She delivered last February and then had Nexplanon.  No menses until last month for 3 days, light flow.  Now having heavy flow x 5 days and notes tender bruising left arm at Nexplanon site x 24 hours.  Patient has same partner for last 9 months.     Past Medical History:  Diagnosis Date  . Chlamydia Feb 2017  . H/O varicella   . Headache in pregnancy, antepartum, second trimester 06/25/2016  . Premature dilatation of cervix during pregnancy 10/01/2011   5/90%/-1  . Preterm delivery 12/04/2011   SVD at [redacted]w[redacted]d on 10/22/11    Patient Active Problem List   Diagnosis Date Noted  . Premature rupture of membranes 04/17/2018  . Supervision of high risk pregnancy, antepartum, second trimester 11/09/2017  . History of cervical incompetence 11/09/2017  . Cervical cerclage suture present, antepartum 09/30/2017  . Status post primary low transverse cesarean section 11/21/2016  . History of preterm delivery, currently pregnant 03/13/2015    Past Surgical History:  Procedure Laterality Date  . CERVICAL CERCLAGE N/A 08/09/2016   Procedure: CERCLAGE CERVICAL;  Surgeon: Catalina Antigua, MD;  Location: [facility];  Service: Gynecology;  Laterality: N/A;  . CERVICAL CERCLAGE N/A 11/25/2017   Procedure: CERCLAGE CERVICAL;  Surgeon: [name], MD;  Location: [facility] BIRTHING SUITES;  Service: Gynecology;  Laterality: N/A;  . CESAREAN SECTION N/A 11/20/2016   Procedure: CESAREAN SECTION;  Surgeon: Willodean Rosenthal, MD;  Location: [facility] BIRTHING SUITES;  Service: Obstetrics;  Laterality: N/A;    OB History    Gravida  4   Para  4   Term  1   Preterm  3   AB   0   Living  3     SAB  0   TAB  0   Ectopic  0   Multiple  0   Live Births  3            Home Medications    Prior to Admission medications   Medication Sig Start Date End Date Taking? Authorizing Provider  etonogestrel (NEXPLANON) 68 MG IMPL implant 1 each by Subdermal route once.   Yes [provider]  megestrol (MEGACE) 40 MG tablet Take 1 tablet (40 mg total) by mouth 2 (two) times daily. 01/28/19   Elvina Sidle, MD  Prenatal Vit-Fe Fumarate-FA (PRENATAL MULTIVITAMIN) TABS tablet Take 1 tablet by mouth daily at 12 noon. 11/09/17   Levie Heritage, DO  senna-docusate (SENOKOT-S) 8.6-50 MG tablet Take 2 tablets by mouth daily. 04/20/18   Joana Reamer, DO    Family History Family History  Problem Relation Age of Onset  . Hypertension Mother   . Diabetes Brother   . Cancer Maternal Grandmother        LUNG  . Diabetes Maternal Grandfather   . Arthritis Paternal Grandmother   . Cancer Paternal Grandmother        BREAST  . Diabetes Maternal Uncle     Social History Social History   Tobacco Use  . Smoking status: Never Smoker  . Smokeless tobacco: Never Used  Substance Use  Topics  . Alcohol use: No  . Drug use: No     Allergies   Patient has no known allergies.   Review of Systems Review of Systems  Genitourinary: Positive for menstrual problem.  All other systems reviewed and are negative.    Physical Exam Triage Vital Signs ED Triage Vitals  Enc Vitals Group     BP      Pulse      Resp      Temp      Temp src      SpO2      Weight      Height      Head Circumference      Peak Flow      Pain Score      Pain Loc      Pain Edu?      Excl. in [place]?    No data found.  Updated Vital Signs BP 118/86 (BP Location: Right Arm)   Pulse 100   Temp 98 F (36.7 C) (Oral)   Resp 15   LMP  (Within Months)   SpO2 97%    Physical Exam Vitals signs and nursing note reviewed.  Constitutional:      General: She is not in  acute distress.    Appearance: She is normal weight. She is not ill-appearing or toxic-appearing.  HENT:     Head: Normocephalic.  Eyes:     Conjunctiva/sclera: Conjunctivae normal.  Neck:     Musculoskeletal: Normal range of motion and neck supple.  Cardiovascular:     Rate and Rhythm: Normal rate.  Pulmonary:     Effort: Pulmonary effort is normal.  Abdominal:     General: Bowel sounds are normal.     Palpations: Abdomen is soft.     Tenderness: There is no abdominal tenderness. There is no right CVA tenderness, left CVA tenderness, guarding or rebound.  Musculoskeletal: Normal range of motion.        General: Tenderness present.  Skin:    General: Skin is warm and dry.     Findings: Bruising present.  Neurological:     General: No focal deficit present.     Mental Status: She is alert and oriented to person, place, and time.     Motor: No weakness.     Gait: Gait normal.  Psychiatric:        Mood and Affect: Mood normal.        UC Treatments / Results  Labs (all labs ordered are listed, but only abnormal results are displayed) Labs Reviewed  POC URINE PREG, ED  POCT PREGNANCY, URINE    EKG   Radiology No results found.  Procedures Procedures (including critical care time)  Medications Ordered in UC Medications - No data to display  Initial Impression / Assessment and Plan / UC Course  I have reviewed the triage vital signs and the nursing notes.  Pertinent labs & imaging results that were available during my care of the patient were reviewed by me and considered in my medical decision making (see chart for details).    Final Clinical Impressions(s) / UC Diagnoses   Final diagnoses:  Vaginal bleeding   Discharge Instructions   None    ED Prescriptions    Medication Sig Dispense Auth. Provider   megestrol (MEGACE) 40 MG tablet Take 1 tablet (40 mg total) by mouth 2 (two) times daily. 10 tablet Robyn Haber, MD     I have reviewed the [place]  during this encounter.   Elvina SidleLauenstein, Aeriel Boulay, MD 01/28/19 1226

## 2019-02-01 DIAGNOSIS — Z89431 Acquired absence of right foot: Secondary | ICD-10-CM | POA: Diagnosis not present

## 2019-02-01 DIAGNOSIS — Z794 Long term (current) use of insulin: Secondary | ICD-10-CM | POA: Diagnosis not present

## 2019-02-01 DIAGNOSIS — E118 Type 2 diabetes mellitus with unspecified complications: Secondary | ICD-10-CM | POA: Diagnosis not present

## 2019-02-01 DIAGNOSIS — Z79899 Other long term (current) drug therapy: Secondary | ICD-10-CM | POA: Diagnosis not present

## 2019-02-01 DIAGNOSIS — I10 Essential (primary) hypertension: Secondary | ICD-10-CM | POA: Diagnosis not present

## 2019-02-01 DIAGNOSIS — R42 Dizziness and giddiness: Secondary | ICD-10-CM | POA: Diagnosis not present

## 2019-02-01 DIAGNOSIS — E1169 Type 2 diabetes mellitus with other specified complication: Secondary | ICD-10-CM | POA: Diagnosis not present

## 2019-02-03 ENCOUNTER — Other Ambulatory Visit: Payer: Self-pay

## 2019-02-03 ENCOUNTER — Ambulatory Visit (INDEPENDENT_AMBULATORY_CARE_PROVIDER_SITE_OTHER): Payer: Medicaid Other | Admitting: Obstetrics and Gynecology

## 2019-02-03 ENCOUNTER — Encounter: Payer: Self-pay | Admitting: Obstetrics and Gynecology

## 2019-02-03 VITALS — BP 135/83 | HR 98 | Wt 118.0 lb

## 2019-02-03 DIAGNOSIS — Z3046 Encounter for surveillance of implantable subdermal contraceptive: Secondary | ICD-10-CM

## 2019-02-03 DIAGNOSIS — Z3042 Encounter for surveillance of injectable contraceptive: Secondary | ICD-10-CM

## 2019-02-03 MED ORDER — MEDROXYPROGESTERONE ACETATE 150 MG/ML IM SUSP
150.0000 mg | Freq: Once | INTRAMUSCULAR | Status: AC
  Administered 2019-02-03: 150 mg via INTRAMUSCULAR

## 2019-02-03 NOTE — Progress Notes (Signed)
     GYNECOLOGY OFFICE PROCEDURE NOTE  Sherri Shepherd is a 28 y.o. [phone] here for Nexplanon removal.  Last pap smear was on 2018 and was normal.  No other gynecologic concerns.  Nexplanon Removal Patient identified, informed consent performed, consent signed.   Appropriate time out taken. Nexplanon site identified.  Area prepped in usual sterile fashon. One ml of 1% lidocaine was used to anesthetize the area at the distal end of the implant. A small stab incision was made right beside the implant on the distal portion.  The Nexplanon rod was grasped using hemostats and removed without difficulty.  There was minimal blood loss. There were no complications.  3 ml of 1% lidocaine was injected around the incision for post-procedure analgesia.  Steri-strips were applied over the small incision.  A pressure bandage was applied to reduce any bruising.  The patient tolerated the procedure well and was given post procedure instructions.  Patient is planning to use depo for contraception/attempt conception.   Sherri Shepherd, Artist Pais, [place] for [employer], [place]

## 2019-02-04 DIAGNOSIS — Z3042 Encounter for surveillance of injectable contraceptive: Secondary | ICD-10-CM | POA: Insufficient documentation

## 2019-02-04 DIAGNOSIS — Z3046 Encounter for surveillance of implantable subdermal contraceptive: Secondary | ICD-10-CM | POA: Insufficient documentation

## 2019-02-09 DIAGNOSIS — O0933 Supervision of pregnancy with insufficient antenatal care, third trimester: Secondary | ICD-10-CM | POA: Diagnosis not present

## 2019-02-09 DIAGNOSIS — Z3A29 29 weeks gestation of pregnancy: Secondary | ICD-10-CM | POA: Diagnosis not present

## 2019-02-09 DIAGNOSIS — Z23 Encounter for immunization: Secondary | ICD-10-CM | POA: Diagnosis not present

## 2019-03-08 IMAGING — US US OB < 14 WEEKS - US OB TV
1 series · 15 of 27 positions shown · non-contrast
Comparison: 09/08/2017

CLINICAL DATA: Assess viability. LMP 08/05/2017. Quantitative beta
first ultrasound patient is 9 weeks 0 days.

EXAM:
OBSTETRIC <14 WK US AND TRANSVAGINAL OB US
TECHNIQUE: Both transabdominal and transvaginal ultrasound examinations were
performed for complete evaluation of the gestation as well as the
maternal uterus, adnexal regions, and pelvic cul-de-sac.
Transvaginal technique was performed to assess early pregnancy.

[Series 1: us ob < 14 weeks - us ob tv · 27 acquisitions, 15 frames shown]
[im 1/27]
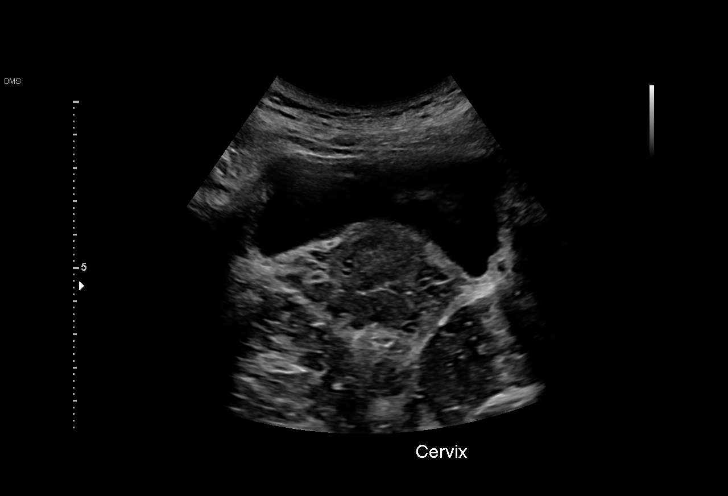
[im 3/27]
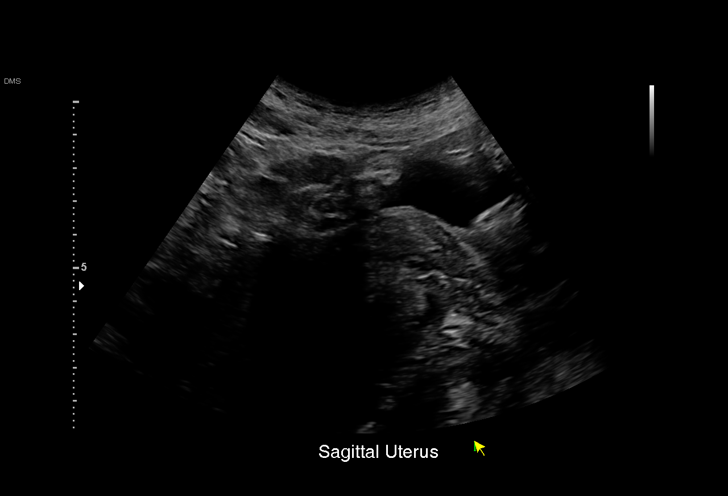
[im 5/27]
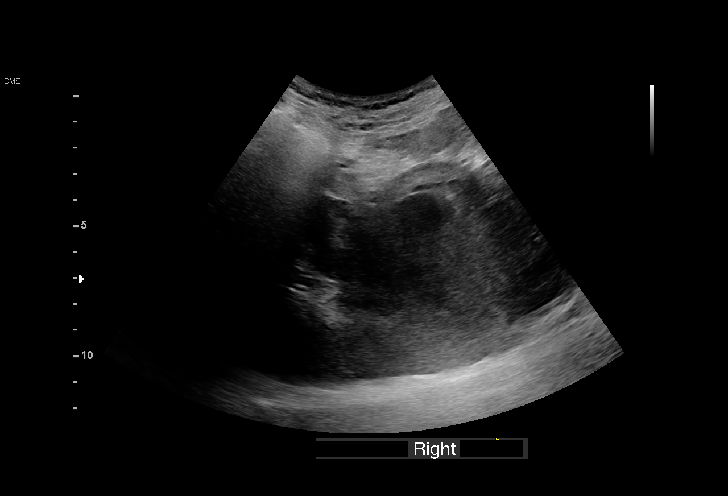
[im 7/27]
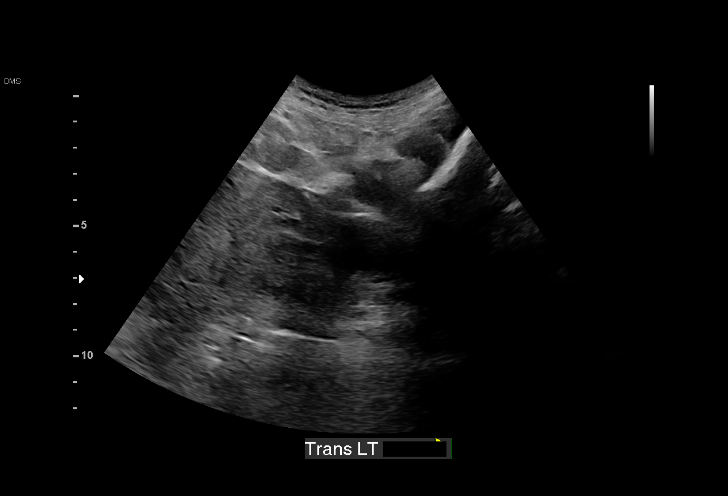
[im 9/27]
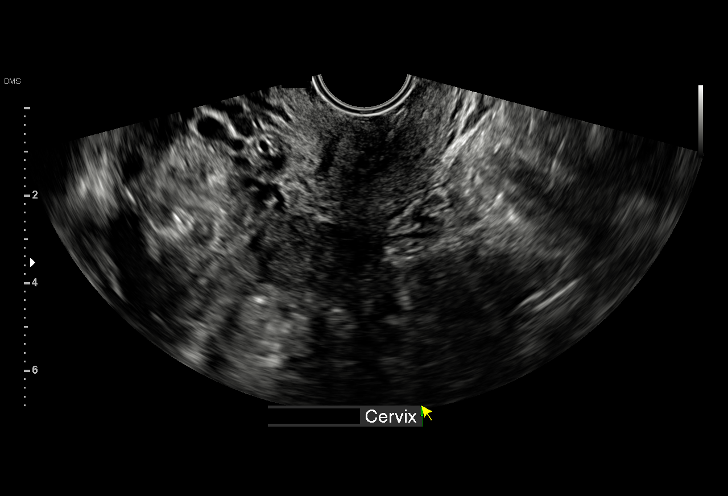
[im 10/27]
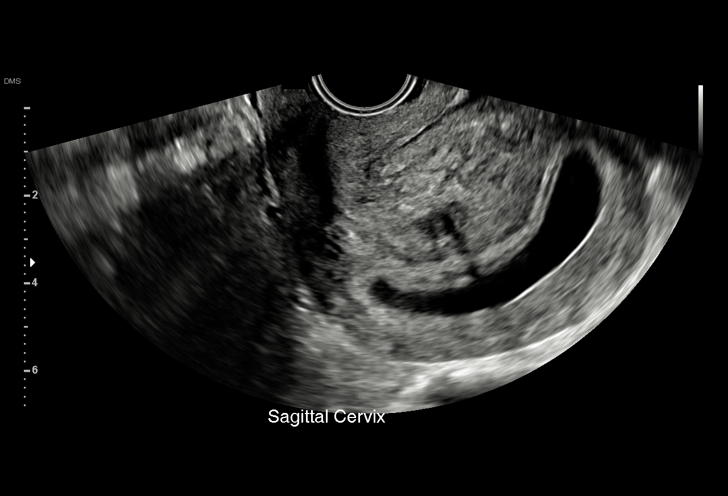
[im 12/27]
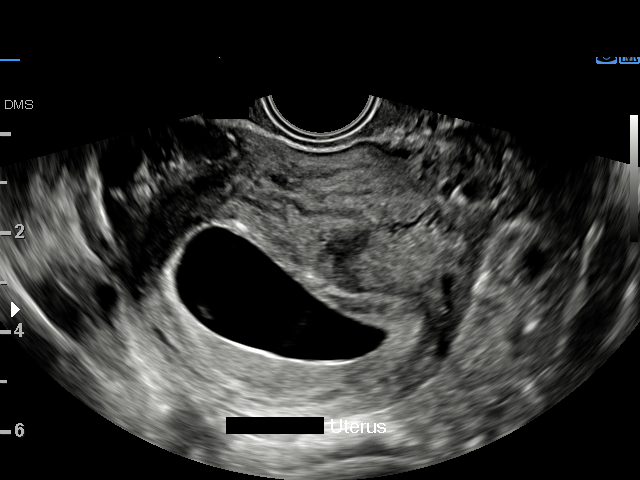
[im 14/27]
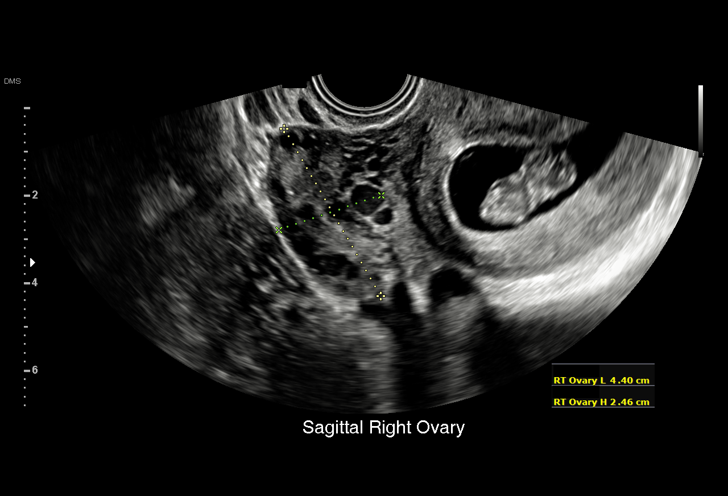
[im 16/27]
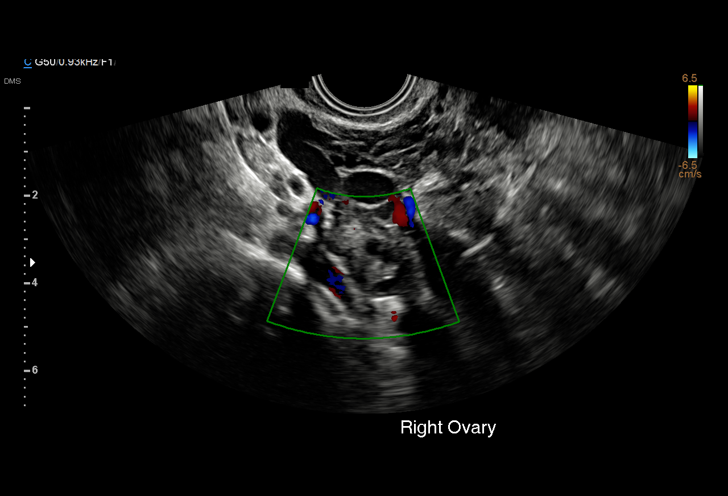
[im 18/27]
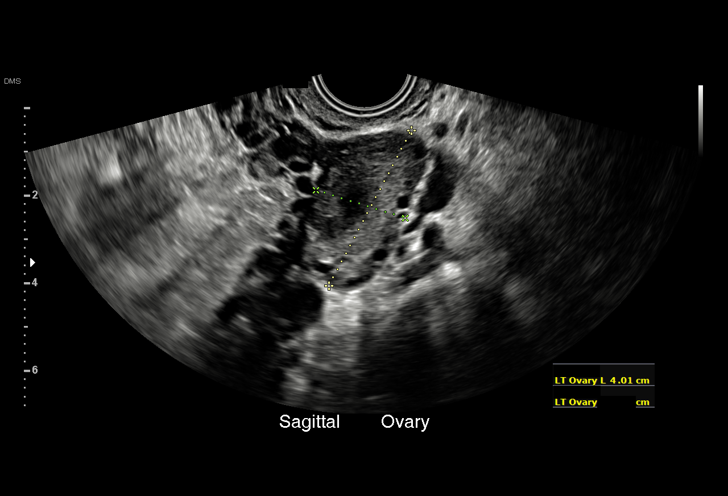
[im 19/27]
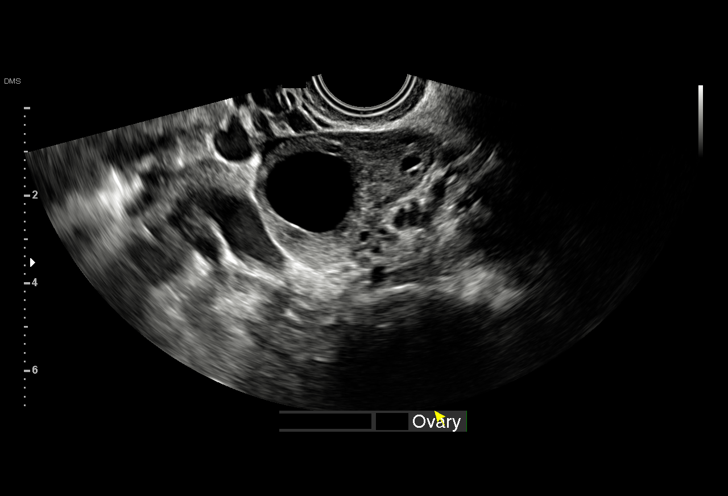
[im 21/27]
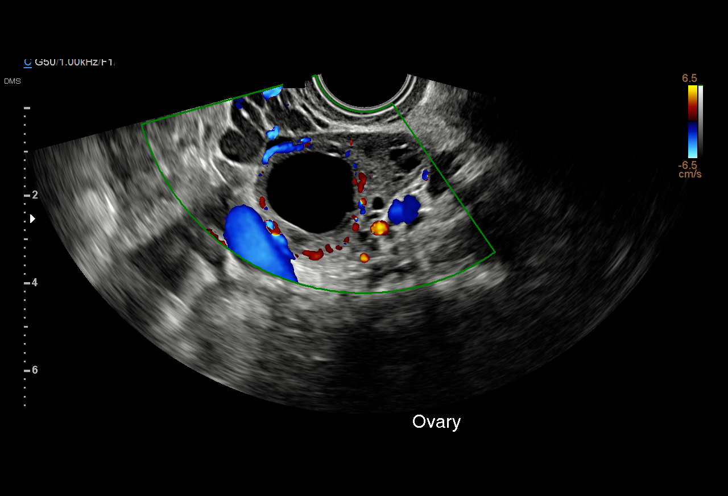
[im 23/27]
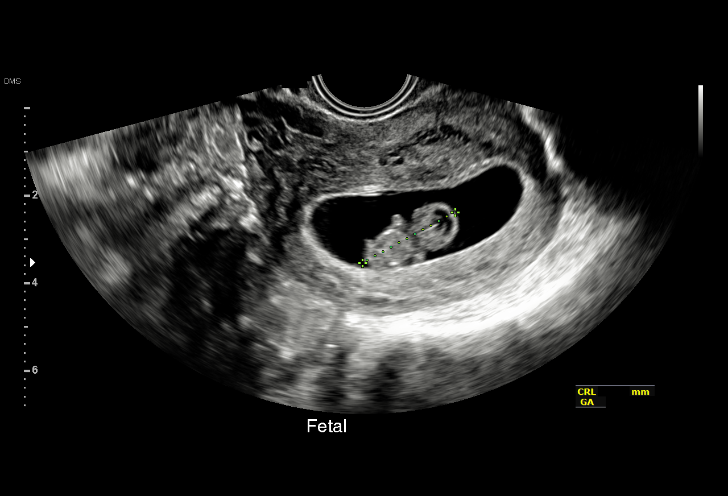
[im 25/27]
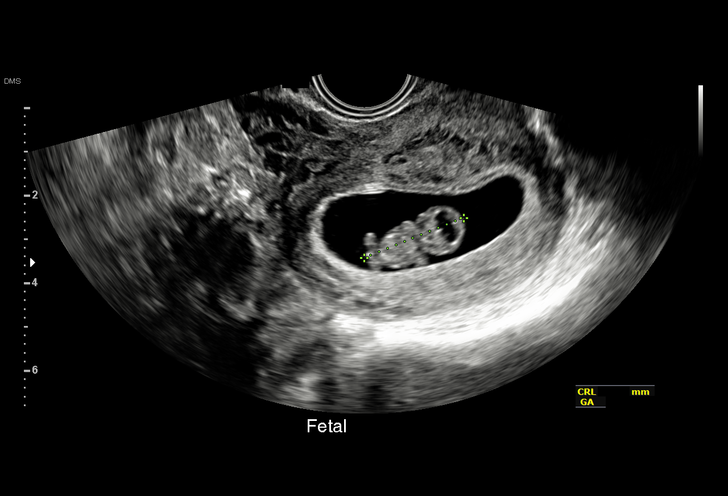
[im 27/27]
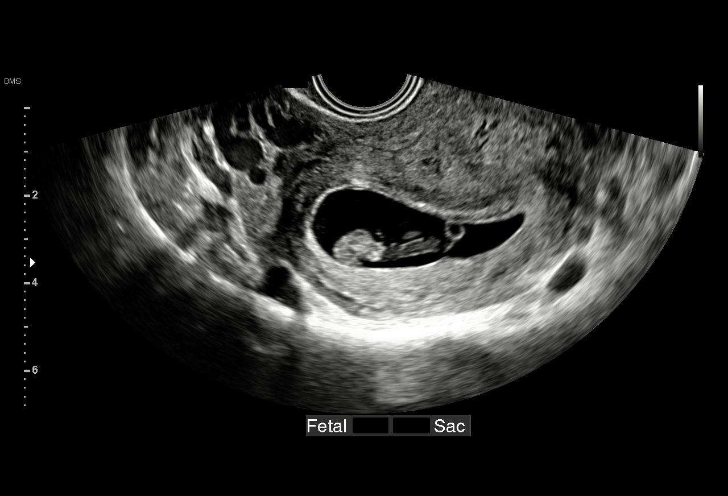

[15 of 27 positions shown; findings below may reference images not displayed]

FINDINGS: Intrauterine gestational sac: Single

Yolk sac:  Visualized.

Embryo:  Visualized.

Cardiac Activity: Visualized.

Heart Rate: 177 bpm

CRL:  24.0 mm   9 w   0 d                  US EDC: 05/06/2018

Subchorionic hemorrhage:  None visualized.

Maternal uterus/adnexae: Ovaries are normal in appearance. Uterus is
retroverted/retroflexed. No free pelvic fluid.
IMPRESSION: 1. Single living intrauterine embryo measuring 9 weeks 0 days.
2. There is appropriate interval growth compared with the previous
ultrasound exam. Today's exam confirms ultrasound EDC of 05/06/2018.

## 2019-04-18 ENCOUNTER — Ambulatory Visit: Payer: Medicaid Other

## 2019-04-21 ENCOUNTER — Ambulatory Visit: Payer: Medicaid Other

## 2019-05-03 ENCOUNTER — Ambulatory Visit (INDEPENDENT_AMBULATORY_CARE_PROVIDER_SITE_OTHER): Payer: Medicaid Other

## 2019-05-03 ENCOUNTER — Other Ambulatory Visit: Payer: Self-pay

## 2019-05-03 VITALS — Wt 130.2 lb

## 2019-05-03 DIAGNOSIS — Z3042 Encounter for surveillance of injectable contraceptive: Secondary | ICD-10-CM | POA: Diagnosis not present

## 2019-05-03 MED ORDER — MEDROXYPROGESTERONE ACETATE 150 MG/ML IM SUSP
150.0000 mg | Freq: Once | INTRAMUSCULAR | Status: AC
  Administered 2019-05-03: 150 mg via INTRAMUSCULAR

## 2019-05-03 NOTE — Progress Notes (Signed)
Lenell Antu here for Depo-Provera  Injection.  Injection administered without complication. Patient will return in 3 months for next injection.  Janene Madeira Jayel Inks, CMA 05/03/2019  11:17 AM

## 2019-07-20 ENCOUNTER — Ambulatory Visit: Payer: Medicaid Other

## 2019-08-02 ENCOUNTER — Ambulatory Visit (INDEPENDENT_AMBULATORY_CARE_PROVIDER_SITE_OTHER): Payer: Medicaid Other

## 2019-08-02 ENCOUNTER — Other Ambulatory Visit: Payer: Self-pay

## 2019-08-02 VITALS — BP 110/81 | HR 75 | Wt 135.9 lb

## 2019-08-02 DIAGNOSIS — Z3042 Encounter for surveillance of injectable contraceptive: Secondary | ICD-10-CM

## 2019-08-02 MED ORDER — MEDROXYPROGESTERONE ACETATE 150 MG/ML IM SUSP
150.0000 mg | Freq: Once | INTRAMUSCULAR | Status: AC
  Administered 2019-08-02: 150 mg via INTRAMUSCULAR

## 2019-08-02 NOTE — Progress Notes (Signed)
Sherri Shepherd here for Depo-Provera  Injection.  Injection administered without complication. Patient will return in 3 months for next injection.  Marjo Bicker, RN 08/02/2019  2:08 PM

## 2019-08-12 DIAGNOSIS — Z79899 Other long term (current) drug therapy: Secondary | ICD-10-CM | POA: Diagnosis not present

## 2019-08-15 DIAGNOSIS — Z79899 Other long term (current) drug therapy: Secondary | ICD-10-CM | POA: Diagnosis not present

## 2019-08-17 DIAGNOSIS — Z79899 Other long term (current) drug therapy: Secondary | ICD-10-CM | POA: Diagnosis not present

## 2019-08-22 DIAGNOSIS — Z79899 Other long term (current) drug therapy: Secondary | ICD-10-CM | POA: Diagnosis not present

## 2019-08-24 DIAGNOSIS — Z79899 Other long term (current) drug therapy: Secondary | ICD-10-CM | POA: Diagnosis not present

## 2019-10-05 ENCOUNTER — Encounter: Payer: Self-pay | Admitting: *Deleted

## 2019-10-11 ENCOUNTER — Telehealth (INDEPENDENT_AMBULATORY_CARE_PROVIDER_SITE_OTHER): Payer: Medicaid Other | Admitting: Family Medicine

## 2019-10-11 DIAGNOSIS — N939 Abnormal uterine and vaginal bleeding, unspecified: Secondary | ICD-10-CM

## 2019-10-11 NOTE — Telephone Encounter (Signed)
Patient want to speak with a nurse about her bleeding and cramping that she's been having

## 2019-10-13 NOTE — Telephone Encounter (Signed)
Returned patients call to the office. Patient reports she has is on the Depo and was having cramping about 2 weeks ago and yesterday she was having some pink discharge yesterday with some red bleeding recently.   She reports she started taking the Depo in March.   She reports the cramping stopped with medication. And has not occurred after about 2 weeks ago.  She is concerned if this is her period or something else with the Depo. Reviewed you can have some spotting on the Depo and she may be having a light period also. She comes in for her next dosage next week.    Reviewed what she is describing sounds normal and to let us know if anything changes. Patient voiced understanding.

## 2019-10-18 ENCOUNTER — Ambulatory Visit: Payer: Medicaid Other

## 2020-07-18 ENCOUNTER — Other Ambulatory Visit (HOSPITAL_COMMUNITY)
Admission: RE | Admit: 2020-07-18 | Discharge: 2020-07-18 | Disposition: A | Discharge status: Home / Self Care | Payer: Medicaid Other | Source: Ambulatory Visit | Attending: Family Medicine | Admitting: Family Medicine

## 2020-07-18 ENCOUNTER — Encounter: Payer: Self-pay | Admitting: Family Medicine

## 2020-07-18 ENCOUNTER — Other Ambulatory Visit: Payer: Self-pay

## 2020-07-18 ENCOUNTER — Ambulatory Visit (INDEPENDENT_AMBULATORY_CARE_PROVIDER_SITE_OTHER): Payer: Medicaid Other | Admitting: Family Medicine

## 2020-07-18 VITALS — BP 120/82 | HR 77 | Wt 139.0 lb

## 2020-07-18 DIAGNOSIS — Z124 Encounter for screening for malignant neoplasm of cervix: Secondary | ICD-10-CM | POA: Diagnosis not present

## 2020-07-18 DIAGNOSIS — N912 Amenorrhea, unspecified: Secondary | ICD-10-CM

## 2020-07-18 DIAGNOSIS — Z01419 Encounter for gynecological examination (general) (routine) without abnormal findings: Secondary | ICD-10-CM

## 2020-07-18 DIAGNOSIS — Z113 Encounter for screening for infections with a predominantly sexual mode of transmission: Secondary | ICD-10-CM

## 2020-07-18 DIAGNOSIS — Z3202 Encounter for pregnancy test, result negative: Secondary | ICD-10-CM | POA: Diagnosis not present

## 2020-07-18 LAB — POCT PREGNANCY, URINE: Preg Test, Ur: NEGATIVE

## 2020-07-18 NOTE — Patient Instructions (Signed)
Preventive Care 11-28 Years Old, Female Preventive care refers to lifestyle choices and visits with your health care provider that can promote health and wellness. This includes:  A yearly physical exam. This is also called an annual wellness visit.  Regular dental and eye exams.  Immunizations.  Screening for certain conditions.  Healthy lifestyle choices, such as: ? Eating a healthy diet. ? Getting regular exercise. ? Not using drugs or products that contain nicotine and tobacco. ? Limiting alcohol use. What can I expect for my preventive care visit? Physical exam Your health care provider may check your:  Height and weight. These may be used to calculate your BMI (body mass index). BMI is a measurement that tells if you are at a healthy weight.  Heart rate and blood pressure.  Body temperature.  Skin for abnormal spots. Counseling Your health care provider may ask you questions about your:  Past medical problems.  Family's medical history.  Alcohol, tobacco, and drug use.  Emotional well-being.  Home life and relationship well-being.  Sexual activity.  Diet, exercise, and sleep habits.  Work and work Statistician.  Access to firearms.  Method of birth control.  Menstrual cycle.  Pregnancy history. What immunizations do I need? Vaccines are usually given at various ages, according to a schedule. Your health care provider will recommend vaccines for you based on your age, medical history, and lifestyle or other factors, such as travel or where you work.   What tests do I need? Blood tests  Lipid and cholesterol levels. These may be checked every 5 years starting at age 64.  Hepatitis C test.  Hepatitis B test. Screening  Diabetes screening. This is done by checking your blood sugar (glucose) after you have not eaten for a while (fasting).  STD (sexually transmitted disease) testing, if you are at risk.  BRCA-related cancer screening. This may  be done if you have a family history of breast, ovarian, tubal, or peritoneal cancers.  Pelvic exam and Pap test. This may be done every 3 years starting at age 61. Starting at age 82, this may be done every 5 years if you have a Pap test in combination with an HPV test. Talk with your health care provider about your test results, treatment options, and if necessary, the need for more tests.   Follow these instructions at home: Eating and drinking  Eat a healthy diet that includes fresh fruits and vegetables, whole grains, lean protein, and low-fat dairy products.  Take vitamin and mineral supplements as recommended by your health care provider.  Do not drink alcohol if: ? Your health care provider tells you not to drink. ? You are pregnant, may be pregnant, or are planning to become pregnant.  If you drink alcohol: ? Limit how much you have to 0-1 drink a day. ? Be aware of how much alcohol is in your drink. In the [place] one drink equals one 12 oz bottle of beer (355 mL), one 5 oz glass of wine (148 mL), or one 1 oz glass of hard liquor (44 mL).   Lifestyle  Take daily care of your teeth and gums. Brush your teeth every morning and night with fluoride toothpaste. Floss one time each day.  Stay active. Exercise for at least 30 minutes 5 or more days each week.  Do not use any products that contain nicotine or tobacco, such as cigarettes, e-cigarettes, and chewing tobacco. If you need help quitting, ask your health care provider.  Do  not use drugs.  If you are sexually active, practice safe sex. Use a condom or other form of protection to prevent STIs (sexually transmitted infections).  If you do not wish to become pregnant, use a form of birth control. If you plan to become pregnant, see your health care provider for a prepregnancy visit.  Find healthy ways to cope with stress, such as: ? Meditation, yoga, or listening to music. ? Journaling. ? Talking to a trusted  person. ? Spending time with friends and family. Safety  Always wear your seat belt while driving or riding in a vehicle.  Do not drive: ? If you have been drinking alcohol. Do not ride with someone who has been drinking. ? When you are tired or distracted. ? While texting.  Wear a helmet and other protective equipment during sports activities.  If you have firearms in your house, make sure you follow all gun safety procedures.  Seek help if you have been physically or sexually abused. What's next?  Go to your health care provider once a year for an annual wellness visit.  Ask your health care provider how often you should have your eyes and teeth checked.  Stay up to date on all vaccines. This information is not intended to replace advice given to you by your health care provider. Make sure you discuss any questions you have with your health care provider. Document Revised: 10/09/2019 Document Reviewed: 10/22/2017 Elsevier Patient Education  2021 [employer].

## 2020-07-18 NOTE — Progress Notes (Signed)
  Subjective:     Sherri KESINGER is a 30 y.o. female and is here for a comprehensive physical exam. The patient reports problems - no cycle since getting off Depo. Last shot was given 6/21. Does not feel pregnant..  The following portions of the patient's history were reviewed and updated as appropriate: allergies, current medications, past family history, past medical history, past social history, past surgical history and problem list.  Review of Systems Pertinent items noted in HPI and remainder of comprehensive ROS otherwise negative.   Objective:    BP 120/82   Pulse 77   Wt 139 lb (63 kg)   BMI 23.13 kg/m  General appearance: alert, cooperative and appears stated age Head: Normocephalic, without obvious abnormality, atraumatic Neck: no adenopathy, supple, symmetrical, trachea midline and thyroid not enlarged, symmetric, no tenderness/mass/nodules Lungs: clear to auscultation bilaterally Breasts: normal appearance, no masses or tenderness Heart: regular rate and rhythm, S1, S2 normal, no murmur, click, rub or gallop Abdomen: soft, non-tender; bowel sounds normal; no masses,  no organomegaly Pelvic: cervix normal in appearance, external genitalia normal, no adnexal masses or tenderness, no cervical motion tenderness, uterus normal size, shape, and consistency and vagina normal without discharge Extremities: extremities normal, atraumatic, no cyanosis or edema Pulses: 2+ and symmetric Skin: Skin color, texture, turgor normal. No rashes or lesions Lymph nodes: Cervical, supraclavicular, and axillary nodes normal. Neurologic: Grossly normal   UPT negative  Assessment:    Healthy female exam.      Plan:  Screen for STD (sexually transmitted disease) - Plan: Cervicovaginal ancillary only( [facility]), RPR, HIV Antibody (routine testing w rflx), Hepatitis C Antibody, Hepatitis B Surface AntiGEN  Screening for malignant neoplasm of cervix - Plan: Cytology - PAP( [facility])  Encounter for gynecological examination without abnormal finding  Amenorrhea - Likely related to depo, check UPT today. If does not resume after 6/22, consider other work-up.--Pregnancy might ensue prior to next nml menses  Return in 1 year (on 07/18/2021).    See After Visit Summary for Counseling Recommendations

## 2020-07-19 LAB — HEPATITIS C ANTIBODY: Hep C Virus Ab: 0.2 s/co ratio (ref 0.0–0.9)

## 2020-07-19 LAB — CERVICOVAGINAL ANCILLARY ONLY
Bacterial Vaginitis (gardnerella): POSITIVE — AB
Candida Glabrata: NEGATIVE
Candida Vaginitis: NEGATIVE
Chlamydia: NEGATIVE
Comment: NEGATIVE
Comment: NEGATIVE
Comment: NEGATIVE
Comment: NEGATIVE
Comment: NEGATIVE
Comment: NORMAL
Neisseria Gonorrhea: NEGATIVE
Trichomonas: NEGATIVE

## 2020-07-19 LAB — CYTOLOGY - PAP: Diagnosis: NEGATIVE

## 2020-07-19 LAB — HEPATITIS B SURFACE ANTIGEN: Hepatitis B Surface Ag: NEGATIVE

## 2020-07-19 LAB — RPR: RPR Ser Ql: NONREACTIVE

## 2020-07-19 LAB — HIV ANTIBODY (ROUTINE TESTING W REFLEX): HIV Screen 4th Generation wRfx: NONREACTIVE

## 2020-07-19 MED ORDER — METRONIDAZOLE 500 MG PO TABS: 500.0000 mg | ORAL_TABLET | Freq: Two times a day (BID) | ORAL | 0 refills | Status: AC

## 2020-07-19 NOTE — Addendum Note (Signed)
Addended by: [name] on: 07/19/2020 03:05 PM   Modules accepted: Orders

## 2021-02-15 ENCOUNTER — Encounter (HOSPITAL_COMMUNITY): Payer: Self-pay | Admitting: Emergency Medicine

## 2021-02-15 ENCOUNTER — Other Ambulatory Visit: Payer: Self-pay

## 2021-02-15 ENCOUNTER — Ambulatory Visit (HOSPITAL_COMMUNITY)
Admission: EM | Admit: 2021-02-15 | Discharge: 2021-02-15 | Disposition: A | Discharge status: Home / Self Care | Payer: Medicaid Other | Attending: Family Medicine | Admitting: Family Medicine

## 2021-02-15 DIAGNOSIS — N898 Other specified noninflammatory disorders of vagina: Secondary | ICD-10-CM | POA: Diagnosis not present

## 2021-02-15 DIAGNOSIS — N926 Irregular menstruation, unspecified: Secondary | ICD-10-CM | POA: Insufficient documentation

## 2021-02-15 LAB — POC URINE PREG, ED: Preg Test, Ur: NEGATIVE

## 2021-02-15 NOTE — ED Triage Notes (Signed)
Pt having vaginal itching  Pt reports last had vaginal bleeding that was heavy and clots that started on Tuesday. Reports had spotting week prior. Abnormal for her cycle and came earlier. Concerned about pregnancy.

## 2021-02-15 NOTE — Discharge Instructions (Addendum)
Staff will call you if anything is positive on your swab that needs treating

## 2021-02-15 NOTE — ED Provider Notes (Signed)
[facility]    CSN: [id number] Arrival date & time: 02/15/21  1616      History   Chief Complaint Chief Complaint  Patient presents with   Vaginal Itching    HPI Sherri Shepherd is a 30 y.o. female.    Vaginal Itching  Here with some vaginal itching and perineal irritation for about 3 days. No dysuria. No fever. Also, her period has been irregular: she spotted last week, then had a heavy period earlier this week. LMP normal was end of November.    Past Medical History:  Diagnosis Date   Chlamydia Feb 2017   H/O varicella    Headache in pregnancy, antepartum, second trimester 06/25/2016   Premature dilatation of cervix during pregnancy 10/01/2011   5/90%/-1   Preterm delivery 12/04/2011   SVD at [redacted]w[redacted]d on 10/22/11    Patient Active Problem List   Diagnosis Date Noted   History of cervical incompetence 11/09/2017    Past Surgical History:  Procedure Laterality Date   CERVICAL CERCLAGE N/A 08/09/2016   Procedure: CERCLAGE CERVICAL;  Surgeon: Catalina Antigua, MD;  Location: [facility];  Service: Gynecology;  Laterality: N/A;   CERVICAL CERCLAGE N/A 11/25/2017   Procedure: CERCLAGE CERVICAL;  Surgeon: [name], MD;  Location: [facility] BIRTHING SUIT[facility];  Service: Gynecology;  Laterality: N/A;   CESAREAN SECTION N/A 11/20/2016   Procedure: CESAREAN SECTION;  Surgeon: Willodean Rosenthal, MD;  Location: [facility]IRTHING SUITES;  Service: Obstetrics;  Laterality: N/A;    OB History     Gravida  4   Para  4   Term  1   Preterm  3   AB  0   Living  3      SAB  0   IAB  0   Ectopic  0   Multiple  0   Live Births  3            Home Medications    Prior to Admission medications   Not on File    Family History Family History  Problem Relation Age of Onset   Hypertension Mother    Diabetes Brother    Cancer Maternal Grandmother        LUNG   Diabetes Maternal Grandfather    Arthritis Paternal Grandmother    Cancer Paternal  Grandmother        BREAST   Diabetes Maternal Uncle     Social History Social History   Tobacco Use   Smoking status: Every Day    Packs/day: 0.25    Types: Cigarettes   Smokeless tobacco: Never  Vaping Use   Vaping Use: Some days  Substance Use Topics   Alcohol use: No   Drug use: No     Allergies   Patient has no known allergies.   Review of Systems Review of Systems   Physical Exam Triage Vital Signs ED Triage Vitals  Enc Vitals Group     BP 02/15/21 1704 130/86     Pulse Rate 02/15/21 1704 68     Resp 02/15/21 1704 17     Temp 02/15/21 1704 98.8 F (37.1 C)     Temp Source 02/15/21 1704 Oral     SpO2 02/15/21 1704 98 %     Weight --      Height --      Head Circumference --      Peak Flow --      Pain Score 02/15/21 1703 8  Pain Loc --      Pain Edu? --      Excl. in GC? --    No data found.  Updated Vital Signs BP 130/86 (BP Location: Right Arm)    Pulse 68    Temp 98.8 F (37.1 C) (Oral)    Resp 17    LMP 02/12/2021    SpO2 98%   Visual Acuity Right Eye Distance:   Left Eye Distance:   Bilateral Distance:    Right Eye Near:   Left Eye Near:    Bilateral Near:     Physical Exam Vitals reviewed.  Constitutional:      General: She is not in acute distress.    Appearance: She is not ill-appearing or toxic-appearing.  HENT:     Mouth/Throat:     Mouth: Mucous membranes are moist.     Pharynx: No oropharyngeal exudate or posterior oropharyngeal erythema.  Cardiovascular:     Rate and Rhythm: Normal rate and regular rhythm.  Abdominal:     Palpations: Abdomen is soft.     Tenderness: There is no abdominal tenderness.  Musculoskeletal:     Cervical back: No rigidity or tenderness.  Skin:    Capillary Refill: Capillary refill takes less than 2 seconds.     Coloration: Skin is not jaundiced or pale.  Neurological:     Mental Status: She is alert and oriented to person, place, and time.  Psychiatric:        Behavior: Behavior normal.      UC Treatments / Results  Labs (all labs ordered are listed, but only abnormal results are displayed) Labs Reviewed  POC URINE PREG, ED  CERVICOVAGINAL ANCILLARY ONLY    EKG   Radiology No results found.  Procedures Procedures (including critical care time)  Medications Ordered in UC Medications - No data to display  Initial Impression / Assessment and Plan / UC Course  I have reviewed the triage vital signs and the nursing notes.  Pertinent labs & imaging results that were available during my care of the patient were reviewed by me and considered in my medical decision making (see chart for details).    UPT neg. Self swab. She will see her PCP Final Clinical Impressions(s) / UC Diagnoses   Final diagnoses:  Vagina itching  Irregular menses     Discharge Instructions      Staff will call you if anything is positive on your swab that needs treating    ED Prescriptions   None    PDMP not reviewed this encounter.   Zenia Resides, MD 02/15/21 [phone]

## 2021-02-19 LAB — CERVICOVAGINAL ANCILLARY ONLY
Bacterial Vaginitis (gardnerella): POSITIVE — AB
Candida Glabrata: NEGATIVE
Candida Vaginitis: POSITIVE — AB
Chlamydia: NEGATIVE
Comment: NEGATIVE
Comment: NEGATIVE
Comment: NEGATIVE
Comment: NEGATIVE
Comment: NEGATIVE
Comment: NORMAL
Neisseria Gonorrhea: NEGATIVE
Trichomonas: NEGATIVE

## 2021-02-20 ENCOUNTER — Telehealth (HOSPITAL_COMMUNITY): Payer: Self-pay | Admitting: Emergency Medicine

## 2021-02-20 MED ORDER — METRONIDAZOLE 500 MG PO TABS: 500.0000 mg | ORAL_TABLET | Freq: Two times a day (BID) | ORAL | 0 refills | Status: DC

## 2021-02-20 MED ORDER — FLUCONAZOLE 150 MG PO TABS: 150.0000 mg | ORAL_TABLET | Freq: Once | ORAL | 0 refills | Status: AC

## 2021-05-12 DIAGNOSIS — S058X1A Other injuries of right eye and orbit, initial encounter: Secondary | ICD-10-CM | POA: Diagnosis not present

## 2021-09-30 ENCOUNTER — Emergency Department (HOSPITAL_BASED_OUTPATIENT_CLINIC_OR_DEPARTMENT_OTHER)
Admission: EM | Admit: 2021-09-30 | Discharge: 2021-10-01 | Discharge status: Against Medical Advice | Payer: Medicaid Other | Attending: Emergency Medicine | Admitting: Emergency Medicine

## 2021-09-30 ENCOUNTER — Encounter (HOSPITAL_BASED_OUTPATIENT_CLINIC_OR_DEPARTMENT_OTHER): Payer: Self-pay

## 2021-09-30 DIAGNOSIS — Z5321 Procedure and treatment not carried out due to patient leaving prior to being seen by health care provider: Secondary | ICD-10-CM | POA: Insufficient documentation

## 2021-09-30 DIAGNOSIS — R519 Headache, unspecified: Secondary | ICD-10-CM | POA: Insufficient documentation

## 2021-09-30 DIAGNOSIS — M79601 Pain in right arm: Secondary | ICD-10-CM | POA: Insufficient documentation

## 2021-09-30 DIAGNOSIS — Y92410 Unspecified street and highway as the place of occurrence of the external cause: Secondary | ICD-10-CM | POA: Insufficient documentation

## 2021-09-30 DIAGNOSIS — H9319 Tinnitus, unspecified ear: Secondary | ICD-10-CM | POA: Insufficient documentation
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
## 2021-10-06 ENCOUNTER — Other Ambulatory Visit: Payer: Self-pay

## 2021-10-06 ENCOUNTER — Emergency Department (HOSPITAL_COMMUNITY): Payer: Medicaid Other

## 2021-10-06 ENCOUNTER — Emergency Department (HOSPITAL_COMMUNITY)
Admission: EM | Admit: 2021-10-06 | Discharge: 2021-10-06 | Disposition: A | Discharge status: Home / Self Care | Payer: Medicaid Other | Attending: Emergency Medicine | Admitting: Emergency Medicine

## 2021-10-06 DIAGNOSIS — M545 Low back pain, unspecified: Secondary | ICD-10-CM

## 2021-10-06 DIAGNOSIS — M542 Cervicalgia: Secondary | ICD-10-CM | POA: Insufficient documentation

## 2021-10-06 DIAGNOSIS — Y92410 Unspecified street and highway as the place of occurrence of the external cause: Secondary | ICD-10-CM | POA: Diagnosis not present

## 2021-10-06 LAB — PREGNANCY, URINE: Preg Test, Ur: NEGATIVE

## 2021-10-06 MED ORDER — MELOXICAM 15 MG PO TABS: 15.0000 mg | ORAL_TABLET | Freq: Every day | ORAL | 0 refills | Status: DC

## 2021-10-06 MED ORDER — METHOCARBAMOL 500 MG PO TABS: 500.0000 mg | ORAL_TABLET | Freq: Two times a day (BID) | ORAL | 0 refills | Status: DC

## 2021-10-06 MED ORDER — IBUPROFEN 200 MG PO TABS
600.0000 mg | ORAL_TABLET | Freq: Once | ORAL | Status: AC
  Administered 2021-10-06: 600 mg via ORAL
  Filled 2021-10-06: qty 3

## 2021-10-06 NOTE — Discharge Instructions (Addendum)
You were seen today for low back and neck pain.  Imaging was reassuring for no signs of fracture or dislocation.  I have prescribed an anti-inflammatory and a muscle relaxant for you.  Please take as prescribed.  There was a 4 mm nodule noted in the left lung apex incidentally.  Please make your primary care team aware of this for further follow-up as needed
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
## 2021-10-10 ENCOUNTER — Ambulatory Visit: Payer: Medicaid Other | Admitting: Internal Medicine

## 2021-10-29 ENCOUNTER — Ambulatory Visit (INDEPENDENT_AMBULATORY_CARE_PROVIDER_SITE_OTHER): Payer: Medicaid Other

## 2021-10-29 ENCOUNTER — Other Ambulatory Visit (HOSPITAL_COMMUNITY)
Admission: RE | Admit: 2021-10-29 | Discharge: 2021-10-29 | Disposition: A | Discharge status: Home / Self Care | Payer: Medicaid Other | Source: Ambulatory Visit | Attending: Student in an Organized Health Care Education/Training Program | Admitting: Student in an Organized Health Care Education/Training Program

## 2021-10-29 VITALS — BP 112/71 | HR 89 | Temp 98.2°F | Ht 66.0 in | Wt 117.6 lb

## 2021-10-29 DIAGNOSIS — R911 Solitary pulmonary nodule: Secondary | ICD-10-CM | POA: Diagnosis not present

## 2021-10-29 DIAGNOSIS — N898 Other specified noninflammatory disorders of vagina: Secondary | ICD-10-CM | POA: Diagnosis not present

## 2021-10-29 NOTE — Patient Instructions (Addendum)
[name], it was a pleasure seeing you today!  Today we discussed: Lung nodule: Your lung nodule is likely benign and would not be expected to grow or cause problems for you in the future. However, given your history of smoking and family history of lung cancer, we will have you return in about 6 months for f/u and CT imaging of your lung.  I have ordered the following labs today:  Lab Orders  No laboratory test(s) ordered today     Tests ordered today:  none  Referrals ordered today:   Referral Orders  No referral(s) requested today     I have ordered the following medication/changed the following medications:   Stop the following medications: Medications Discontinued During This Encounter  Medication Reason   meloxicam (MOBIC) 15 MG tablet Discontinued by provider   methocarbamol (ROBAXIN) 500 MG tablet Patient has not taken in last 30 days   metroNIDAZOLE (FLAGYL) 500 MG tablet Completed Course     Start the following medications: No orders of the defined types were placed in this encounter.    Follow-up: 6 months   Please make sure to arrive 15 minutes prior to your next appointment. If you arrive late, you may be asked to reschedule.   We look forward to seeing you next time. Please call our clinic at [phone] if you have any questions or concerns. The best time to call is Monday-Friday from 9am-4pm, but there is someone available 24/7. If after hours or the weekend, call the main hospital number and ask for the Internal Medicine Resident On-Call. If you need medication refills, please notify your pharmacy one week in advance and they will send [place] a request.  Thank you for letting us take part in your care. Wishing you the best!  Thank you, Adron Bene, MD

## 2021-10-29 NOTE — Assessment & Plan Note (Addendum)
Patient had incidental finding on 8/13 CT cervical spine with 4 mm apical lung nodule. Patient does not have a personal cancer history. Nodule not felt to be malignant. Asymptomatic as expected with nodule of this size. Lungs CTAB.   -Will return for f/u lung CT in 6 months

## 2021-10-29 NOTE — Progress Notes (Signed)
   CC: establish care  HPI:  Sherri Shepherd is a 31 y.o. with medical history as below who presents to establish care.  Past Medical History:  Diagnosis Date   Chlamydia Feb 2017   H/O varicella    Headache in pregnancy, antepartum, second trimester 06/25/2016   Premature dilatation of cervix during pregnancy 10/01/2011   5/90%/-1   Preterm delivery 12/04/2011   SVD at [redacted]w[redacted]d on 10/22/11   Review of Systems: ROS negative except for pertinent items detailed in assessment and plan.  Physical Exam:  Vitals:   10/29/21 1534  BP: 112/71  Pulse: 89  Temp: 98.2 F (36.8 C)  TempSrc: Oral  SpO2: 100%  Weight: 117 lb 9.6 oz (53.3 kg)  Height: 5\' 6"  (1.676 m)   Physical Exam Constitutional:      General: She is not in acute distress. Cardiovascular:     Rate and Rhythm: Normal rate and regular rhythm.  Pulmonary:     Effort: Pulmonary effort is normal.     Breath sounds: Normal breath sounds.  Neurological:     Mental Status: She is alert.      Assessment & Plan:   See Encounters Tab for problem based charting.  Incidental lung nodule, > 22mm and < 23mm Patient had incidental finding on 8/13 CT cervical spine with 4 mm apical lung nodule. Patient does not have a personal cancer history. Nodule not felt to be malignant. Asymptomatic as expected with nodule of this size. Lungs CTAB.   -Will return for f/u lung CT in 6 months    Patient seen with Dr. 9/13

## 2021-10-30 LAB — CERVICOVAGINAL ANCILLARY ONLY
Bacterial Vaginitis (gardnerella): POSITIVE — AB
Candida Glabrata: NEGATIVE
Candida Vaginitis: POSITIVE — AB
Chlamydia: NEGATIVE
Comment: NEGATIVE
Comment: NEGATIVE
Comment: NEGATIVE
Comment: NEGATIVE
Comment: NEGATIVE
Comment: NORMAL
Neisseria Gonorrhea: NEGATIVE
Trichomonas: NEGATIVE

## 2021-10-30 NOTE — Progress Notes (Signed)
Internal Medicine Clinic Attending   I saw and evaluated the patient.  I personally confirmed the key portions of the history and exam documented by Dr. White and I reviewed pertinent patient test results.  The assessment, diagnosis, and plan were formulated together and I agree with the documentation in the resident's note.  

## 2021-10-30 NOTE — Addendum Note (Signed)
Addended by: Erlinda Hong T on: 10/30/2021 11:13 AM   Modules accepted: Level of Service

## 2021-10-31 ENCOUNTER — Ambulatory Visit: Payer: Medicaid Other | Admitting: Surgery

## 2021-11-16 ENCOUNTER — Encounter: Payer: Self-pay | Admitting: Emergency Medicine

## 2021-11-16 ENCOUNTER — Ambulatory Visit
Admission: EM | Admit: 2021-11-16 | Discharge: 2021-11-16 | Disposition: A | Discharge status: Home / Self Care | Payer: Medicaid Other | Attending: Physician Assistant | Admitting: Physician Assistant

## 2021-11-16 DIAGNOSIS — Z3202 Encounter for pregnancy test, result negative: Secondary | ICD-10-CM

## 2021-11-16 DIAGNOSIS — N644 Mastodynia: Secondary | ICD-10-CM

## 2021-11-16 LAB — POCT URINE PREGNANCY: Preg Test, Ur: NEGATIVE

## 2021-11-16 NOTE — ED Provider Notes (Signed)
[facility]    CSN: [id number] Arrival date & time: 11/16/21  1246      History   Chief Complaint Chief Complaint  Patient presents with   Breast Discharge    HPI Sherri Shepherd is a 31 y.o. female.   Patient here today for evaluation of nipple sensitivity that has been ongoing the last 3 days. Patient reports this is what occurred with her last pregnancy. Her LMP was earlier this month. She denies any other symptoms including nipple discharge, nausea, etc.   The history is provided by the patient.    Past Medical History:  Diagnosis Date   Chlamydia 03/2015   H/O varicella    Headache in pregnancy, antepartum, second trimester 06/25/2016   Premature dilatation of cervix during pregnancy 10/01/2011   5/90%/-1   Preterm delivery 12/04/2011   SVD at [redacted]w[redacted]d on 10/22/11    Patient Active Problem List   Diagnosis Date Noted   Incidental lung nodule, > 34mm and < 66mm 10/29/2021   Vaginal pruritus 10/29/2021   History of cervical incompetence 11/09/2017    Past Surgical History:  Procedure Laterality Date   CERVICAL CERCLAGE N/A 08/09/2016   Procedure: CERCLAGE CERVICAL;  Surgeon: Mora Bellman, MD;  Location: [facility];  Service: Gynecology;  Laterality: N/A;   CERVICAL CERCLAGE N/A 11/25/2017   Procedure: CERCLAGE CERVICAL;  Surgeon: Donnamae Jude, MD;  Location: [place];  Service: Gynecology;  Laterality: N/A;   CESAREAN SECTION N/A 11/20/2016   Procedure: CESAREAN SECTION;  Surgeon: Lavonia Drafts, MD;  Location: [place];  Service: Obstetrics;  Laterality: N/A;    OB History     Gravida  4   Para  4   Term  1   Preterm  3   AB  0   Living  3      SAB  0   IAB  0   Ectopic  0   Multiple  0   Live Births  3            Home Medications    Prior to Admission medications   Not on File    Family History Family History  Problem Relation Age of Onset   Hypertension Mother    Diabetes Brother     Cancer Maternal Grandmother        LUNG   Diabetes Maternal Grandfather    Arthritis Paternal Grandmother    Cancer Paternal Grandmother        BREAST   Diabetes Maternal Uncle     Social History Social History   Tobacco Use   Smoking status: Every Day    Packs/day: 0.25    Types: Cigarettes   Smokeless tobacco: Never  Vaping Use   Vaping Use: Some days  Substance Use Topics   Alcohol use: No   Drug use: No     Allergies   Patient has no known allergies.   Review of Systems Review of Systems  Constitutional:  Negative for chills and fever.  Eyes:  Negative for discharge and redness.  Respiratory:  Negative for shortness of breath.   Gastrointestinal:  Negative for abdominal pain, nausea and vomiting.     Physical Exam Triage Vital Signs ED Triage Vitals  Enc Vitals Group     BP 11/16/21 1346 119/81     Pulse Rate 11/16/21 1346 88     Resp 11/16/21 1346 18     Temp 11/16/21 1346 98.5 F (36.9 C)  Temp src --      SpO2 11/16/21 1346 97 %     Weight --      Height --      Head Circumference --      Peak Flow --      Pain Score 11/16/21 1345 0     Pain Loc --      Pain Edu? --      Excl. in GC? --    No data found.  Updated Vital Signs BP 119/81   Pulse 88   Temp 98.5 F (36.9 C)   Resp 18   LMP 10/28/2021 (Approximate)   SpO2 97%      Physical Exam Vitals and nursing note reviewed.  Constitutional:      General: She is not in acute distress.    Appearance: Normal appearance. She is not ill-appearing.  HENT:     Head: Normocephalic and atraumatic.  Eyes:     Conjunctiva/sclera: Conjunctivae normal.  Cardiovascular:     Rate and Rhythm: Normal rate.  Pulmonary:     Effort: Pulmonary effort is normal.  Neurological:     Mental Status: She is alert.  Psychiatric:        Mood and Affect: Mood normal.        Behavior: Behavior normal.        Thought Content: Thought content normal.      UC Treatments / Results  Labs (all labs  ordered are listed, but only abnormal results are displayed) Labs Reviewed  BETA HCG QUANT (REF LAB)  POCT URINE PREGNANCY    EKG   Radiology No results found.  Procedures Procedures (including critical care time)  Medications Ordered in UC Medications - No data to display  Initial Impression / Assessment and Plan / UC Course  I have reviewed the triage vital signs and the nursing notes.  Pertinent labs & imaging results that were available during my care of the patient were reviewed by me and considered in my medical decision making (see chart for details).    Urine pregnancy test negative- will order hcg quant at patient's request. Will await results for further recommendation.   Final Clinical Impressions(s) / UC Diagnoses   Final diagnoses:  Nipple soreness   Discharge Instructions   None    ED Prescriptions   None    PDMP not reviewed this encounter.   Tomi Bamberger, PA-C 11/16/21 1455

## 2021-11-16 NOTE — ED Triage Notes (Signed)
Pt is present today with sensitivity to her areolas that she noticed x3 days ago. Pt states that she may have concerns for pregnancy

## 2021-11-19 LAB — BETA HCG QUANT (REF LAB): hCG Quant: <1 m[IU]/mL

## 2023-02-08 ENCOUNTER — Encounter (HOSPITAL_BASED_OUTPATIENT_CLINIC_OR_DEPARTMENT_OTHER): Payer: Self-pay | Admitting: Emergency Medicine

## 2023-02-08 ENCOUNTER — Other Ambulatory Visit: Payer: Self-pay

## 2023-02-08 ENCOUNTER — Emergency Department (HOSPITAL_BASED_OUTPATIENT_CLINIC_OR_DEPARTMENT_OTHER)
Admission: EM | Admit: 2023-02-08 | Discharge: 2023-02-08 | Disposition: A | Discharge status: Home / Self Care | Payer: Medicaid Other | Attending: Emergency Medicine | Admitting: Emergency Medicine

## 2023-02-08 DIAGNOSIS — K0889 Other specified disorders of teeth and supporting structures: Secondary | ICD-10-CM | POA: Diagnosis not present

## 2023-02-08 MED ORDER — ACETAMINOPHEN 500 MG PO TABS
1000.0000 mg | ORAL_TABLET | Freq: Once | ORAL | Status: AC
  Administered 2023-02-08: 1000 mg via ORAL
  Filled 2023-02-08: qty 2

## 2023-02-08 MED ORDER — AMOXICILLIN-POT CLAVULANATE 875-125 MG PO TABS: 1.0000 | ORAL_TABLET | Freq: Two times a day (BID) | ORAL | 0 refills | Status: AC

## 2023-02-08 NOTE — ED Notes (Signed)
Notified provider about patient requesting pain medication.

## 2023-02-08 NOTE — ED Triage Notes (Signed)
Correction left upper tooth

## 2023-02-08 NOTE — ED Triage Notes (Signed)
Right upper back tooth chipped earlier in year, gums are now swollen and into her cheek. This happened about 2 nights ago.

## 2023-02-08 NOTE — ED Provider Notes (Signed)
[facility] AT [facility] Provider Note   CSN: [id number] Arrival date & time: 02/08/23  1155     History  Chief Complaint  Patient presents with   Dental Pain    Sherri Shepherd is a 32 y.o. female who presents with left upper dental pain.  Reports that she has had this pain for the past year and has noticed small chips at a time falling from the tooth.  Two nights ago she was eating and she felt another piece fall off and now has pain.  Denies any fevers, chills, vomiting or any systemic symptoms.  No difficulty swallowing.  Patient states she is here for antibiotics because she is concerned it is infected, intends to follow-up with a dentist tomorrow.   Dental Pain      Home Medications Prior to Admission medications   Medication Sig Start Date End Date Taking? Authorizing Provider  amoxicillin-clavulanate (AUGMENTIN) 875-125 MG tablet Take 1 tablet by mouth every 12 (twelve) hours. 02/08/23  Yes Halford Decamp, PA-C      Allergies    Patient has no known allergies.    Review of Systems   Review of Systems  HENT:  Positive for dental problem.     Physical Exam Updated Vital Signs BP 138/79 (BP Location: Right Arm)   Pulse 79   Temp 98.2 F (36.8 C) (Oral)   Resp 17   Wt 54.4 kg   SpO2 100%   BMI 19.37 kg/m  Physical Exam Vitals and nursing note reviewed.  Constitutional:      General: She is not in acute distress.    Appearance: She is well-developed.  HENT:     Head: Normocephalic and atraumatic.     Comments: Overall poor dentition.  There appears to be a dental carry of tooth 16, that is broken down, there is gingival swelling and erythema without any obvious abscess.  The area is notably tender.  No facial swelling noted.  No trismus or pooling of secretions.  No change in phonation.  No anterior cervical tenderness. Eyes:     Conjunctiva/sclera: Conjunctivae normal.  Cardiovascular:     Rate and Rhythm: Normal  rate and regular rhythm.     Heart sounds: No murmur heard. Pulmonary:     Effort: Pulmonary effort is normal. No respiratory distress.     Breath sounds: Normal breath sounds.  Abdominal:     Palpations: Abdomen is soft.     Tenderness: There is no abdominal tenderness.  Musculoskeletal:        General: No swelling.     Cervical back: Neck supple.  Skin:    General: Skin is warm and dry.     Capillary Refill: Capillary refill takes less than 2 seconds.  Neurological:     Mental Status: She is alert.  Psychiatric:        Mood and Affect: Mood normal.     ED Results / Procedures / Treatments   Labs (all labs ordered are listed, but only abnormal results are displayed) Labs Reviewed - No data to display  EKG None  Radiology No results found.  Procedures Procedures    Medications Ordered in ED Medications  acetaminophen (TYLENOL) tablet 1,000 mg (1,000 mg Oral Given 02/08/23 1555)    ED Course/ Medical Decision Making/ A&P  Medical Decision Making  This patient presents to the ED with chief complaint(s) of dental pain.  The complaint involves an extensive differential diagnosis and also carries with it a high risk of complications and morbidity.   pertinent past medical history as listed in HPI  The differential diagnosis includes  Dental fracture, avulsion, dental abscess, Ludwigs,   Additional history obtained: No additional historians or records utilized.  Initial Assessment:   Patient overall poor dentition, hemodynamically stable, afebrile.  Does appear that she has likely a dental abscess with a notable dental carie and tooth decay and gingival swelling and erythema.  There is not appear to be an obvious abscess to I&D today.  Anticipate discharge with p.o. antibiotics and close 10 to this follow-up.  No trismus or pooling of secretions, no change in phonation.  No concern for Ludwigs or peritonsillar abscess.  Independent  ECG interpretation:  None  Independent labs interpretation:  The following labs were independently interpreted:  None  Independent visualization and interpretation of imaging: none  Treatment and Reassessment: Patient given Tylenol following first assessment Upon reassessment I am able to view tooth #16 with obvious decay and surrounding erythema and gingival swelling, no obvious abscess.  Consultations obtained:   None  Disposition:   Patient will be discharged home on course of Augmentin.  Encouraged close dentist follow-up. The patient has been appropriately medically screened and/or stabilized in the ED. I have low suspicion for any other emergent medical condition which would require further screening, evaluation or treatment in the ED or require inpatient management. At time of discharge the patient is hemodynamically stable and in no acute distress. I have discussed work-up results and diagnosis with patient and answered all questions. Patient is agreeable with discharge plan. We discussed strict return precautions for returning to the emergency department and they verbalized understanding.     Social Determinants of Health:   None  This note was dictated with voice recognition software.  Despite best efforts at proofreading, errors may have occurred which can change the documentation meaning.          Final Clinical Impression(s) / ED Diagnoses Final diagnoses:  Pain, dental    Rx / DC Orders ED Discharge Orders          Ordered    amoxicillin-clavulanate (AUGMENTIN) 875-125 MG tablet  Every 12 hours        02/08/23 1655              Halford Decamp, [place] 02/08/23 1656    Arby Barrette, MD 02/13/23 1331

## 2023-02-08 NOTE — Discharge Instructions (Addendum)
It was a pleasure taking care of you today.  You were evaluated in the emergency room your clinical exam is consistent with a dental abscess secondary to a dental carrie.  A prescription for antibiotics has been sent into your pharmacy.  Please be sure to complete the full course of antibiotics.  Please use Tylenol and ibuprofen as needed for pain.  Please be sure to schedule an appointment with a dentist this week.  You were provided contact information for dentist and the area.  If you experience any new or worsening symptoms including difficulty swallowing, worsening pain, fevers and chills please return to the emergency room.

## 2023-12-14 DIAGNOSIS — Z114 Encounter for screening for human immunodeficiency virus [HIV]: Secondary | ICD-10-CM | POA: Diagnosis not present

## 2023-12-14 DIAGNOSIS — N72 Inflammatory disease of cervix uteri: Secondary | ICD-10-CM | POA: Diagnosis not present

## 2023-12-14 DIAGNOSIS — Z202 Contact with and (suspected) exposure to infections with a predominantly sexual mode of transmission: Secondary | ICD-10-CM | POA: Diagnosis not present

## 2023-12-14 DIAGNOSIS — Z113 Encounter for screening for infections with a predominantly sexual mode of transmission: Secondary | ICD-10-CM | POA: Diagnosis not present

## 2023-12-14 DIAGNOSIS — N760 Acute vaginitis: Secondary | ICD-10-CM | POA: Diagnosis not present

## 2024-02-23 ENCOUNTER — Encounter: Payer: Self-pay | Admitting: Emergency Medicine

## 2024-02-23 ENCOUNTER — Ambulatory Visit: Admission: EM | Admit: 2024-02-23 | Discharge: 2024-02-23 | Disposition: A | Discharge status: Home / Self Care

## 2024-02-23 DIAGNOSIS — J101 Influenza due to other identified influenza virus with other respiratory manifestations: Secondary | ICD-10-CM

## 2024-02-23 DIAGNOSIS — R051 Acute cough: Secondary | ICD-10-CM

## 2024-02-23 LAB — POCT INFLUENZA A/B
Influenza A, POC: NEGATIVE
Influenza B, POC: POSITIVE — AB

## 2024-02-23 MED ORDER — ONDANSETRON 4 MG PO TBDP: 4.0000 mg | ORAL_TABLET | Freq: Three times a day (TID) | ORAL | 0 refills | Status: AC | PRN

## 2024-02-23 MED ORDER — OSELTAMIVIR PHOSPHATE 75 MG PO CAPS: 75.0000 mg | ORAL_CAPSULE | Freq: Two times a day (BID) | ORAL | 0 refills | Status: AC

## 2024-02-23 NOTE — ED Provider Notes (Signed)
 " [facility]    CSN: [id number] Arrival date & time: 02/23/24  1600      History   Chief Complaint Chief Complaint  Patient presents with   URI    HPI Sherri Shepherd is a 33 y.o. female.   Patient is here requesting evaluation for the new onset of a headache, runny nose, sore throat, fatigue, and nausea that started yesterday.  She has tried Mucinex and a multisymptom flu medication without any resolution in the symptoms.  No reported fever, chest pain, shortness of breath, abdominal pain, or diarrhea.  The history is provided by the patient.  URI Presenting symptoms: congestion, cough, rhinorrhea and sore throat   Presenting symptoms: no fatigue and no fever   Associated symptoms: headaches and myalgias     Past Medical History:  Diagnosis Date   Chlamydia 03/2015   H/O varicella    Headache in pregnancy, antepartum, second trimester 06/25/2016   Premature dilatation of cervix during pregnancy 10/01/2011   5/90%/-1   Preterm delivery 12/04/2011   SVD at [redacted]w[redacted]d on 10/22/11    Patient Active Problem List   Diagnosis Date Noted   Incidental lung nodule, > 3mm and < 8mm 10/29/2021   Vaginal pruritus 10/29/2021   History of cervical incompetence 11/09/2017    Past Surgical History:  Procedure Laterality Date   CERVICAL CERCLAGE N/A 08/09/2016   Procedure: CERCLAGE CERVICAL;  Surgeon: Alger Gong, MD;  Location: [facility];  Service: Gynecology;  Laterality: N/A;   CERVICAL CERCLAGE N/A 11/25/2017   Procedure: CERCLAGE CERVICAL;  Surgeon: Fredirick Glenys RAMAN, MD;  Location: [facility];  Service: Gynecology;  Laterality: N/A;   CESAREAN SECTION N/A 11/20/2016   Procedure: CESAREAN SECTION;  Surgeon: Corene Coy, MD;  Location: [facility];  Service: Obstetrics;  Laterality: N/A;    OB History     Gravida  4   Para  4   Term  1   Preterm  3   AB  0   Living  3      SAB  0   IAB  0   Ectopic  0   Multiple  0    Live Births  3            Home Medications    Prior to Admission medications  Medication Sig Start Date End Date Taking? Authorizing Provider  ondansetron  (ZOFRAN -ODT) 4 MG disintegrating tablet Take 1 tablet (4 mg total) by mouth every 8 (eight) hours as needed for nausea or vomiting. 02/23/24  Yes Janet Therisa JINNY, FNP  oseltamivir (TAMIFLU) 75 MG capsule Take 1 capsule (75 mg total) by mouth every 12 (twelve) hours. 02/23/24  Yes Janet Therisa JINNY, FNP  OVER THE COUNTER MEDICATION Mucinex   Yes [provider]  amoxicillin -clavulanate (AUGMENTIN ) 875-125 MG tablet Take 1 tablet by mouth every 12 (twelve) hours. 02/08/23   Donnajean Lynwood DEL, PA-C    Family History Family History  Problem Relation Age of Onset   Hypertension Mother    Diabetes Brother    Cancer Maternal Grandmother        LUNG   Diabetes Maternal Grandfather    Arthritis Paternal Grandmother    Cancer Paternal Grandmother        BREAST   Diabetes Maternal Uncle     Social History Social History[1]   Allergies   Patient has no known allergies.   Review of Systems Review of Systems  Constitutional:  Negative for chills, fatigue and  fever.  HENT:  Positive for congestion, rhinorrhea and sore throat.   Eyes:  Negative for pain and redness.  Respiratory:  Positive for cough.   Cardiovascular:  Negative for chest pain.  Gastrointestinal:  Positive for nausea. Negative for abdominal pain, diarrhea and vomiting.  Genitourinary:  Negative for dysuria.  Musculoskeletal:  Positive for back pain and myalgias.  Skin:  Negative for rash.  Neurological:  Positive for headaches. Negative for dizziness.  Psychiatric/Behavioral:  Negative for sleep disturbance.      Physical Exam Triage Vital Signs ED Triage Vitals  Encounter Vitals Group     BP 02/23/24 1744 118/81     Girls Systolic BP Percentile --      Girls Diastolic BP Percentile --      Boys Systolic BP Percentile --      Boys Diastolic BP  Percentile --      Pulse Rate 02/23/24 1744 77     Resp 02/23/24 1744 16     Temp 02/23/24 1744 98.5 F (36.9 C)     Temp Source 02/23/24 1744 Oral     SpO2 02/23/24 1744 98 %     Weight 02/23/24 1743 126 lb (57.2 kg)     Height --      Head Circumference --      Peak Flow --      Pain Score 02/23/24 1742 10     Pain Loc --      Pain Education --      Exclude from Growth Chart --    No data found.  Updated Vital Signs BP 118/81 (BP Location: Left Arm)   Pulse 77   Temp 98.5 F (36.9 C) (Oral)   Resp 16   Wt 126 lb (57.2 kg)   LMP 02/20/2024 (Exact Date)   SpO2 98%   BMI 20.34 kg/m   Physical Exam Vitals and nursing note reviewed.  Constitutional:      Appearance: Normal appearance.  HENT:     Head: Normocephalic.     Right Ear: Tympanic membrane and ear canal normal.     Left Ear: Ear canal normal.     Nose: No congestion.     Mouth/Throat:     Mouth: Mucous membranes are moist.     Pharynx: No posterior oropharyngeal erythema.  Eyes:     Conjunctiva/sclera: Conjunctivae normal.     Pupils: Pupils are equal, round, and reactive to light.  Cardiovascular:     Rate and Rhythm: Normal rate and regular rhythm.     Heart sounds: Normal heart sounds.  Pulmonary:     Effort: Pulmonary effort is normal.     Breath sounds: Normal breath sounds. No wheezing, rhonchi or rales.  Abdominal:     General: Bowel sounds are normal.  Skin:    General: Skin is warm and dry.  Neurological:     General: No focal deficit present.     Mental Status: She is alert and oriented to person, place, and time.  Psychiatric:        Mood and Affect: Mood normal.        Behavior: Behavior normal.        Thought Content: Thought content normal.        Judgment: Judgment normal.     UC Treatments / Results  Labs (all labs ordered are listed, but only abnormal results are displayed) Labs Reviewed  POCT INFLUENZA A/B - Abnormal; Notable for the following components:  Result Value    Influenza B, POC Positive (*)    All other components within normal limits    EKG   Radiology No results found.  Procedures Procedures (including critical care time)  Medications Ordered in UC Medications - No data to display  Initial Impression / Assessment and Plan / UC Course  I have reviewed the triage vital signs and the nursing notes.  Pertinent labs & imaging results that were available during my care of the patient were reviewed by me and considered in my medical decision making (see chart for details).    Patient presented requesting evaluation with new onset of URI symptoms and a cough that started yesterday.  Positive influenza B testing here in clinic.  Prescription for Tamiflu twice daily for 5 days has been provided.  Additionally, because she has had nausea-I have sent a prescription for Zofran  for her to use as needed.  Ensure adequate rest and oral hydration.  OTC medications such as Tylenol , DayQuil, NyQuil.  Work note has been provided. Final Clinical Impressions(s) / UC Diagnoses   Final diagnoses:  Influenza B  Acute cough     Discharge Instructions      You have Influenza B I have sent Tamiflu twice daily x five days to the pharmacy I also prescribed Zofran  for any nausea OTC medications such as tylenol , dayquil, nyquil for symptom management Ensure adequate rest and oral hydration Work note provided    ED Prescriptions     Medication Sig Dispense Auth. Provider   oseltamivir (TAMIFLU) 75 MG capsule Take 1 capsule (75 mg total) by mouth every 12 (twelve) hours. 10 capsule Janet Therisa PARAS, FNP   ondansetron  (ZOFRAN -ODT) 4 MG disintegrating tablet Take 1 tablet (4 mg total) by mouth every 8 (eight) hours as needed for nausea or vomiting. 20 tablet Janet Therisa PARAS, FNP      PDMP not reviewed this encounter.    [1]  Social History Tobacco Use   Smoking status: Every Day    Current packs/day: 0.25    Types: Cigarettes    Passive exposure:  Current   Smokeless tobacco: Never  Vaping Use   Vaping status: Some Days   Substances: Nicotine, Flavoring  Substance Use Topics   Alcohol use: No   Drug use: No     Janet Therisa PARAS, FNP 02/23/24 1842  "

## 2024-02-23 NOTE — ED Triage Notes (Signed)
 Pt presents c/o URI x 2 days. Pt states,  My head hurts. My back hurts. And I'm on and off hot. I gotta cough. I'm also nauseas.  Pt denies emesis and diarrhea.

## 2024-02-23 NOTE — Discharge Instructions (Addendum)
 You have Influenza B I have sent Tamiflu twice daily x five days to the pharmacy I also prescribed Zofran  for any nausea OTC medications such as tylenol , dayquil, nyquil for symptom management Ensure adequate rest and oral hydration Work note provided
